# Patient Record
Sex: Female | Born: 1950 | Race: White | Hispanic: No | Marital: Married
Health system: Midwestern US, Community
[De-identification: ages and names within clinical notes are randomized; demographics above are authoritative.]

## PROBLEM LIST (undated history)

## (undated) DIAGNOSIS — B019 Varicella without complication: Secondary | ICD-10-CM

## (undated) DIAGNOSIS — M1990 Unspecified osteoarthritis, unspecified site: Secondary | ICD-10-CM

## (undated) DIAGNOSIS — T7840XA Allergy, unspecified, initial encounter: Secondary | ICD-10-CM

## (undated) DIAGNOSIS — M545 Low back pain, unspecified: Secondary | ICD-10-CM

## (undated) DIAGNOSIS — L570 Actinic keratosis: Secondary | ICD-10-CM

## (undated) DIAGNOSIS — N952 Postmenopausal atrophic vaginitis: Secondary | ICD-10-CM

## (undated) DIAGNOSIS — M419 Scoliosis, unspecified: Secondary | ICD-10-CM

## (undated) DIAGNOSIS — N390 Urinary tract infection, site not specified: Secondary | ICD-10-CM

## (undated) DIAGNOSIS — M8580 Other specified disorders of bone density and structure, unspecified site: Secondary | ICD-10-CM

## (undated) DIAGNOSIS — M810 Age-related osteoporosis without current pathological fracture: Secondary | ICD-10-CM

## (undated) DIAGNOSIS — Z1231 Encounter for screening mammogram for malignant neoplasm of breast: Secondary | ICD-10-CM

## (undated) HISTORY — DX: Scoliosis, unspecified: M41.9

## (undated) HISTORY — PX: TUBAL LIGATION: SHX77

## (undated) HISTORY — DX: Unspecified osteoarthritis, unspecified site: M19.90

## (undated) HISTORY — DX: Actinic keratosis: L57.0

## (undated) HISTORY — DX: Low back pain, unspecified: M54.50

## (undated) HISTORY — DX: Urinary tract infection, site not specified: N39.0

## (undated) HISTORY — PX: OTHER SURGICAL HISTORY: SHX169

## (undated) HISTORY — DX: Postmenopausal atrophic vaginitis: N95.2

## (undated) HISTORY — DX: Low back pain: M54.5

## (undated) HISTORY — PX: BREAST BIOPSY: SHX20

## (undated) HISTORY — DX: Other specified disorders of bone density and structure, unspecified site: M85.80

## (undated) HISTORY — PX: NASAL SEPTUM SURGERY: SHX37

## (undated) HISTORY — DX: Varicella without complication: B01.9

## (undated) HISTORY — DX: Allergy, unspecified, initial encounter: T78.40XA

---

## 2004-07-16 LAB — HM COLONOSCOPY: HM Colonoscopy: NORMAL

## 2010-06-15 LAB — HM PAP SMEAR: HM Pap smear: NORMAL

## 2011-11-05 LAB — HM MAMMOGRAPHY

## 2011-11-21 ENCOUNTER — Ambulatory Visit: Payer: Self-pay | Admitting: Family Medicine

## 2011-12-06 HISTORY — PX: OTHER SURGICAL HISTORY: SHX169

## 2011-12-22 ENCOUNTER — Ambulatory Visit: Payer: Self-pay | Admitting: Internal Medicine

## 2012-07-16 ENCOUNTER — Encounter: Payer: Self-pay | Admitting: Internal Medicine

## 2012-07-16 ENCOUNTER — Ambulatory Visit (INDEPENDENT_AMBULATORY_CARE_PROVIDER_SITE_OTHER): Payer: Managed Care, Other (non HMO) | Admitting: Internal Medicine

## 2012-07-16 ENCOUNTER — Other Ambulatory Visit: Payer: Self-pay | Admitting: Internal Medicine

## 2012-07-16 VITALS — BP 130/80 | HR 95 | Temp 98.4°F | Ht 65.75 in | Wt 178.0 lb

## 2012-07-16 DIAGNOSIS — M545 Low back pain: Secondary | ICD-10-CM | POA: Insufficient documentation

## 2012-07-16 DIAGNOSIS — L678 Other hair color and hair shaft abnormalities: Secondary | ICD-10-CM | POA: Insufficient documentation

## 2012-07-16 DIAGNOSIS — J309 Allergic rhinitis, unspecified: Secondary | ICD-10-CM | POA: Insufficient documentation

## 2012-07-16 DIAGNOSIS — L989 Disorder of the skin and subcutaneous tissue, unspecified: Secondary | ICD-10-CM | POA: Insufficient documentation

## 2012-07-16 DIAGNOSIS — L680 Hirsutism: Secondary | ICD-10-CM

## 2012-07-16 DIAGNOSIS — N952 Postmenopausal atrophic vaginitis: Secondary | ICD-10-CM

## 2012-07-16 MED ORDER — ESTRADIOL 10 MCG VA TABS: 1.0000 | ORAL_TABLET | VAGINAL | Status: DC

## 2012-07-16 MED ORDER — ESTRADIOL 25 MCG VA TABS: 25.0000 ug | ORAL_TABLET | VAGINAL | Status: DC

## 2012-07-16 MED ORDER — CYCLOSPORINE 0.05 % OP EMUL: 1.0000 [drp] | Freq: Two times a day (BID) | OPHTHALMIC | Status: DC

## 2012-07-16 MED ORDER — FLUTICASONE PROPIONATE 50 MCG/ACT NA SUSP: 2.0000 | Freq: Every day | NASAL | Status: DC

## 2012-07-16 MED ORDER — EFLORNITHINE HCL 13.9 % EX CREA: 1.0000 "application " | TOPICAL_CREAM | Freq: Two times a day (BID) | CUTANEOUS | Status: DC

## 2012-07-16 NOTE — Assessment & Plan Note (Signed)
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 Will set up referral to dermatology for biopsy.

## 2012-07-16 NOTE — Telephone Encounter (Signed)
Patient called in and states that she was about to mail in her prescriptions and noticed that the vagifem was for 25 mcg, however she states they no longer offer that dosage and she needs one that states 10 mg.  She said everything else could stay the same on the prescription.  Please call when this is ready for her to pick up.

## 2012-07-16 NOTE — Progress Notes (Signed)
Subjective:    Patient ID: Jill Michael, female    DOB: Dec 09, 1950, 61 y.o.   MRN: [id number]  HPI 61 year old female with history of atrophic vaginitis, allergic rhinitis presents to establish care. Her primary concern today is a several day history of lower back pain. She reports that she strained her back after driving for a prolonged period and then bending to reach some tissue in her bathroom. The pain became severe over last weekend and she went to urgent care. She was treated with ibuprofen 600 mg 3 times daily and Flexeril to use as needed. She reports some improvement with this. She continues to have some pain in her lower back and decreased range of motion. She denies any weakness in her legs or numbness in her legs. She denies any loss of control of bowel or bladder. She has had this kind of back pain in the past on 2 other occasions.  In regards to her history of atrophic vaginitis, she reports symptoms are well controlled with Vagifem.  In regards to history of allergic rhinitis, she reports symptoms are well controlled with Nasonex, but she would like to change to [facility] because of cost of medication. Symptoms typically include sneezing and watery eyes. She also uses Claritin to help with symptoms.  Outpatient Encounter Prescriptions as of 07/16/2012  Medication Sig Dispense Refill  . Calcium Carbonate Antacid 400 MG CHEW Chew 1 tablet by mouth daily.      . Cholecalciferol (VITAMIN D) 2000 UNITS CAPS Take 1 capsule by mouth daily.      . cycloSPORINE (RESTASIS) 0.05 % ophthalmic emulsion Place 1 drop into both eyes 2 (two) times daily.  0.4 mL  3  . Eflornithine HCl (VANIQA) 13.9 % cream Apply 1 application topically 2 (two) times daily with a meal.  30 g  3  . estradiol (VAGIFEM) 25 MCG vaginal tablet Place 1 tablet (25 mcg total) vaginally 2 (two) times a week.  24 tablet  3  . fluticasone (FLONASE) 50 MCG/ACT nasal spray Place 2 sprays into the nose daily.  16 g  3  . Ibuprofen  200 MG CAPS Take 1 capsule by mouth 3 (three) times daily.      Marland Kitchen loratadine (CLARITIN) 10 MG tablet Take 10 mg by mouth daily.      . Misc Natural Products (OSTEO BI-FLEX ADV DOUBLE ST PO) Take 1 capsule by mouth daily.      . Omega-3 Fatty Acids (FISH OIL) 1000 MG CAPS Take 1 capsule by mouth daily.       BP 130/80  Pulse 95  Temp 98.4 F (36.9 C) (Oral)  Ht 5' 5.75" (1.67 m)  Wt 178 lb (80.74 kg)  BMI 28.95 kg/m2  SpO2 96%  Review of Systems  Constitutional: Negative for fever, chills, appetite change, fatigue and unexpected weight change.  HENT: Negative for ear pain, congestion, sore throat, trouble swallowing, neck pain, voice change and sinus pressure.   Eyes: Negative for visual disturbance.  Respiratory: Negative for cough, shortness of breath, wheezing and stridor.   Cardiovascular: Negative for chest pain, palpitations and leg swelling.  Gastrointestinal: Negative for nausea, vomiting, abdominal pain, diarrhea, constipation, blood in stool, abdominal distention and anal bleeding.  Genitourinary: Negative for dysuria and flank pain.  Musculoskeletal: Positive for back pain. Negative for myalgias, arthralgias and gait problem.  Skin: Negative for color change and rash.  Neurological: Negative for dizziness and headaches.  Hematological: Negative for adenopathy. Does not bruise/bleed easily.  Psychiatric/Behavioral: Negative  for suicidal ideas, disturbed wake/sleep cycle and dysphoric mood. The patient is not nervous/anxious.        Objective:   Physical Exam  Constitutional: She is oriented to person, place, and time. She appears well-developed and well-nourished. No distress.  HENT:  Head: Normocephalic and atraumatic.  Right Ear: External ear normal.  Left Ear: External ear normal.  Nose: Nose normal.  Mouth/Throat: Oropharynx is clear and moist. No oropharyngeal exudate.  Eyes: Conjunctivae are normal. Pupils are equal, round, and reactive to light. Right eye  exhibits no discharge. Left eye exhibits no discharge. No scleral icterus.  Neck: Normal range of motion. Neck supple. No tracheal deviation present. No thyromegaly present.  Cardiovascular: Normal rate, regular rhythm, normal heart sounds and intact distal pulses.  Exam reveals no gallop and no friction rub.   No murmur heard. Pulmonary/Chest: Effort normal and breath sounds normal. No respiratory distress. She has no wheezes. She has no rales. She exhibits no tenderness.  Abdominal: Soft. Bowel sounds are normal. She exhibits no distension. There is no tenderness.  Musculoskeletal: She exhibits no edema and no tenderness.       Lumbar back: She exhibits decreased range of motion and pain.  Lymphadenopathy:    She has no cervical adenopathy.  Neurological: She is alert and oriented to person, place, and time. No cranial nerve deficit. She exhibits normal muscle tone. Coordination normal.  Skin: Skin is warm and dry. No rash noted. She is not diaphoretic. No erythema. No pallor.  Psychiatric: She has a normal mood and affect. Her behavior is normal. Judgment and thought content normal.          Assessment & Plan:

## 2012-07-16 NOTE — Telephone Encounter (Signed)
Rx ready for pick up, patient advised via telephone.

## 2012-07-16 NOTE — Assessment & Plan Note (Signed)
Symptoms well controlled with Nasonex, but given cost of this medication will change to generic Flonase. Followup as needed.

## 2012-07-16 NOTE — Assessment & Plan Note (Signed)
Patient with recent exacerbation of lower back pain. Using ibuprofen and Flexeril with some improvement. We'll continue to monitor. If symptoms are persisting, would favor adding prednisone pack.

## 2012-07-16 NOTE — Assessment & Plan Note (Signed)
Symptoms well controlled with use of Vaniqua. Will continue.

## 2012-07-16 NOTE — Assessment & Plan Note (Signed)
Symptoms well controlled with Vagifem. Will refill today.

## 2012-07-17 ENCOUNTER — Encounter: Payer: Self-pay | Admitting: Internal Medicine

## 2012-07-17 MED ORDER — PREDNISONE (PAK) 10 MG PO TABS: ORAL_TABLET | ORAL | Status: AC

## 2012-07-17 MED ORDER — PREDNISONE (PAK) 10 MG PO TABS: ORAL_TABLET | ORAL | Status: DC

## 2012-07-19 LAB — HM PAP SMEAR: HM Pap smear: NEGATIVE

## 2012-08-07 ENCOUNTER — Other Ambulatory Visit: Payer: Self-pay | Admitting: Internal Medicine

## 2012-08-08 LAB — LIPID PANEL W/O CHOL/HDL RATIO
LDL Calculated: 117 mg/dL — ABNORMAL HIGH (ref 0–99)
Triglycerides: 134 mg/dL (ref 0–149)

## 2012-08-08 LAB — COMPREHENSIVE METABOLIC PANEL
Albumin: 4 g/dL (ref 3.6–4.8)
Alkaline Phosphatase: 55 IU/L (ref 42–107)
BUN/Creatinine Ratio: 27 — ABNORMAL HIGH (ref 11–26)
BUN: 21 mg/dL (ref 8–27)
CO2: 25 mmol/L (ref 19–28)
Creatinine, Ser: 0.78 mg/dL (ref 0.57–1.00)
GFR calc Af Amer: 96 mL/min/{1.73_m2} (ref >59)
GFR calc non Af Amer: 83 mL/min/{1.73_m2} (ref >59)
Globulin, Total: 2.5 g/dL (ref 1.5–4.5)
Total Bilirubin: 0.6 mg/dL (ref 0.0–1.2)

## 2012-08-08 LAB — CBC WITH DIFFERENTIAL
Eos: 11 % — ABNORMAL HIGH (ref 0–7)
HCT: 42.3 % (ref 34.0–46.6)
Lymphocytes Absolute: 1.7 10*3/uL (ref 0.7–4.5)
MCV: 93 fL (ref 79–97)
Monocytes: 11 % (ref 4–13)
Neutrophils Absolute: 2.1 10*3/uL (ref 1.8–7.8)
RBC: 4.57 x10E6/uL (ref 3.77–5.28)
WBC: 4.9 10*3/uL (ref 4.0–10.5)

## 2012-08-08 LAB — TSH: TSH: 3.04 u[IU]/mL (ref 0.450–4.500)

## 2012-08-16 LAB — HM PAP SMEAR: HM Pap smear: NEGATIVE

## 2012-09-03 ENCOUNTER — Ambulatory Visit (INDEPENDENT_AMBULATORY_CARE_PROVIDER_SITE_OTHER): Payer: Managed Care, Other (non HMO) | Admitting: Internal Medicine

## 2012-09-03 ENCOUNTER — Encounter: Payer: Self-pay | Admitting: Internal Medicine

## 2012-09-03 ENCOUNTER — Other Ambulatory Visit (HOSPITAL_COMMUNITY)
Admission: RE | Admit: 2012-09-03 | Discharge: 2012-09-03 | Disposition: A | Discharge status: Home / Self Care | Payer: Managed Care, Other (non HMO) | Source: Ambulatory Visit | Attending: Internal Medicine | Admitting: Internal Medicine

## 2012-09-03 VITALS — BP 122/74 | HR 96 | Temp 98.3°F | Ht 66.25 in | Wt 178.5 lb

## 2012-09-03 DIAGNOSIS — Z0000 Encounter for general adult medical examination without abnormal findings: Secondary | ICD-10-CM

## 2012-09-03 DIAGNOSIS — Z01419 Encounter for gynecological examination (general) (routine) without abnormal findings: Secondary | ICD-10-CM

## 2012-09-03 DIAGNOSIS — Z1151 Encounter for screening for human papillomavirus (HPV): Secondary | ICD-10-CM | POA: Insufficient documentation

## 2012-09-03 NOTE — Progress Notes (Signed)
Subjective:    Patient ID: Jill Michael, female    DOB: [birth year], 61 y.o.   MRN: [id number]  HPI 61 year old female presents for annual exam. She reports she is generally doing well. She notes that she is planning to have resurfacing of her right knee because of ongoing osteoarthritis. This is scheduled for December 2013. She currently uses ibuprofen to help control pain in her right knee. Aside from this, she reports she is doing well. She follows a relatively healthy diet and gets physical activity. She denies any new concerns today.  Outpatient Encounter Prescriptions as of 09/03/2012  Medication Sig Dispense Refill  . Calcium Carbonate Antacid 400 MG CHEW Chew 1 tablet by mouth daily.      . Cholecalciferol (VITAMIN D) 2000 UNITS CAPS Take 1 capsule by mouth daily.      . cycloSPORINE (RESTASIS) 0.05 % ophthalmic emulsion Place 1 drop into both eyes 2 (two) times daily.  0.4 mL  3  . Eflornithine HCl (VANIQA) 13.9 % cream Apply 1 application topically 2 (two) times daily with a meal.  30 g  3  . Estradiol (VAGIFEM) 10 MCG TABS Place 1 tablet (10 mcg total) vaginally 2 (two) times a week.  24 tablet  3  . fluticasone (FLONASE) 50 MCG/ACT nasal spray Place 2 sprays into the nose daily.  16 g  3  . Ibuprofen 200 MG CAPS Take 1 capsule by mouth 3 (three) times daily.      Marland Kitchen loratadine (CLARITIN) 10 MG tablet Take 10 mg by mouth daily.      . Misc Natural Products (OSTEO BI-FLEX ADV DOUBLE ST PO) Take 1 capsule by mouth daily.      . Omega-3 Fatty Acids (FISH OIL) 1000 MG CAPS Take 1 capsule by mouth daily.        Review of Systems  Constitutional: Negative for fever, chills, appetite change, fatigue and unexpected weight change.  HENT: Negative for ear pain, congestion, sore throat, trouble swallowing, neck pain, voice change and sinus pressure.   Eyes: Negative for visual disturbance.  Respiratory: Negative for cough, shortness of breath, wheezing and stridor.   Cardiovascular: Negative  for chest pain, palpitations and leg swelling.  Gastrointestinal: Negative for nausea, vomiting, abdominal pain, diarrhea, constipation, blood in stool, abdominal distention and anal bleeding.  Genitourinary: Negative for dysuria and flank pain.  Musculoskeletal: Negative for myalgias, arthralgias and gait problem.  Skin: Negative for color change and rash.  Neurological: Negative for dizziness and headaches.  Hematological: Negative for adenopathy. Does not bruise/bleed easily.  Psychiatric/Behavioral: Negative for suicidal ideas, disturbed wake/sleep cycle and dysphoric mood. The patient is not nervous/anxious.        Objective:   Physical Exam  Constitutional: She is oriented to person, place, and time. She appears well-developed and well-nourished. No distress.  HENT:  Head: Normocephalic and atraumatic.  Right Ear: External ear normal.  Left Ear: External ear normal.  Nose: Nose normal.  Mouth/Throat: Oropharynx is clear and moist. No oropharyngeal exudate.  Eyes: Conjunctivae normal are normal. Pupils are equal, round, and reactive to light. Right eye exhibits no discharge. Left eye exhibits no discharge. No scleral icterus.  Neck: Normal range of motion. Neck supple. No tracheal deviation present. No thyromegaly present.  Cardiovascular: Normal rate, regular rhythm, normal heart sounds and intact distal pulses.  Exam reveals no gallop and no friction rub.   No murmur heard. Pulmonary/Chest: Effort normal and breath sounds normal. No respiratory distress. She has no wheezes. She  has no rales. She exhibits no tenderness.  Abdominal: Soft. Bowel sounds are normal. She exhibits no distension and no mass. There is no tenderness. There is no rebound and no guarding.  Genitourinary: Rectum normal, vagina normal and uterus normal. No breast swelling, tenderness, discharge or bleeding. Pelvic exam was performed with patient supine. There is no rash, tenderness or lesion on the right labia.  There is no rash, tenderness or lesion on the left labia. Uterus is not enlarged and not tender. Cervix exhibits no motion tenderness, no discharge and no friability. Right adnexum displays no mass, no tenderness and no fullness. Left adnexum displays no mass, no tenderness and no fullness. No erythema or tenderness around the vagina. No vaginal discharge found.  Musculoskeletal: She exhibits no edema and no tenderness.       Right knee: She exhibits decreased range of motion.  Lymphadenopathy:    She has no cervical adenopathy.  Neurological: She is alert and oriented to person, place, and time. No cranial nerve deficit. She exhibits normal muscle tone. Coordination normal.  Skin: Skin is warm and dry. No rash noted. She is not diaphoretic. No erythema. No pallor.  Psychiatric: She has a normal mood and affect. Her behavior is normal. Judgment and thought content normal.          Assessment & Plan:

## 2012-09-03 NOTE — Patient Instructions (Signed)
Lac-Hydrin 

## 2012-09-03 NOTE — Assessment & Plan Note (Signed)
General medical exam is normal today including breast and pelvic exam. Pap is pending. Reviewed recent lab work including CBC, CMP, lipid profile. Encourage continued efforts at healthy diet and regular physical activity. Patient is up-to-date on vaccinations. Other health maintenance is up to date. Followup in one year or sooner as needed. Note that patient would be considered low perioperative risk for upcoming right knee surgery.

## 2012-10-29 ENCOUNTER — Ambulatory Visit: Payer: Self-pay | Admitting: Unknown Physician Specialty

## 2012-11-15 ENCOUNTER — Encounter: Payer: Self-pay | Admitting: Adult Health

## 2012-11-15 ENCOUNTER — Ambulatory Visit (INDEPENDENT_AMBULATORY_CARE_PROVIDER_SITE_OTHER): Payer: Managed Care, Other (non HMO) | Admitting: Adult Health

## 2012-11-15 VITALS — BP 120/74 | HR 74 | Temp 98.3°F | Ht 66.0 in | Wt 179.0 lb

## 2012-11-15 DIAGNOSIS — R079 Chest pain, unspecified: Secondary | ICD-10-CM

## 2012-11-15 DIAGNOSIS — K219 Gastro-esophageal reflux disease without esophagitis: Secondary | ICD-10-CM

## 2012-11-15 DIAGNOSIS — J329 Chronic sinusitis, unspecified: Secondary | ICD-10-CM | POA: Insufficient documentation

## 2012-11-15 MED ORDER — CIPROFLOXACIN HCL 500 MG PO TABS: 500.0000 mg | ORAL_TABLET | Freq: Two times a day (BID) | ORAL | Status: DC

## 2012-11-15 NOTE — Assessment & Plan Note (Signed)
Pain localized in lower chest area midline and slightly to the left. EKG normal. Patient has not been preparing her meals since surgery. Husband has been bringing her take-out. Symptoms likely r/t acid reflux which she has had in the past. Zantac ordered.

## 2012-11-15 NOTE — Assessment & Plan Note (Addendum)
History of acid reflux that has been well controlled with diet. New symptoms likely secondary to change in diet since surgery 11/07/12. Zantac ordered.

## 2012-11-15 NOTE — Patient Instructions (Addendum)
Stop the Doxycycline and start Cipro for 7 days. Continue your claritin and flonase.   EKG was normal. You are probably experiencing some acid reflux. Take Zantac 150 mg once or twice per day.  Call if no resolution of symptoms within 2-3 days.

## 2012-11-15 NOTE — Progress Notes (Signed)
Subjective:    Patient ID: Jill Michael, female    DOB: 1951-11-01, 62 y.o.   MRN: [id number]  HPI  Patient is a 61 y/o female who is s/p right partial knee replacement on 11/07/12. She began to experience fever (100-101 F), chills and HA the Friday post sgy. Pt went to see her Orthopod on Monday (11/12/12) and was prescribed Doxycycline. She reports now feeling fullness in right ear, post nasal drip and coughing. Prior to sgy, she stopped taking her claritin but resumed this yesterday.  Pt also reports that last night she experienced pain in the lower part of her chest which woke her. She thought it might have been r/t eating some broccoli for dinner and took tums. She reports the pain was originally sharp and now is more of a dull ache. Denies pain radiating. No burning.  Current Outpatient Prescriptions on File Prior to Visit  Medication Sig Dispense Refill  . Calcium Carbonate Antacid 400 MG CHEW Chew 1 tablet by mouth daily.      . Cholecalciferol (VITAMIN D) 2000 UNITS CAPS Take 1 capsule by mouth daily.      . cycloSPORINE (RESTASIS) 0.05 % ophthalmic emulsion Place 1 drop into both eyes 2 (two) times daily.  0.4 mL  3  . Eflornithine HCl (VANIQA) 13.9 % cream Apply 1 application topically 2 (two) times daily with a meal.  30 g  3  . Estradiol (VAGIFEM) 10 MCG TABS Place 1 tablet (10 mcg total) vaginally 2 (two) times a week.  24 tablet  3  . fluticasone (FLONASE) 50 MCG/ACT nasal spray Place 2 sprays into the nose daily.  16 g  3  . loratadine (CLARITIN) 10 MG tablet Take 10 mg by mouth daily.      . Misc Natural Products (OSTEO BI-FLEX ADV DOUBLE ST PO) Take 1 capsule by mouth daily.      . Omega-3 Fatty Acids (FISH OIL) 1000 MG CAPS Take 1 capsule by mouth daily.         Review of Systems  Constitutional: Positive for fever and chills. Negative for appetite change.  HENT: Positive for congestion and postnasal drip.   Eyes: Negative.   Respiratory: Positive for cough. Negative for  chest tightness and shortness of breath.   Gastrointestinal: Positive for nausea and abdominal pain. Negative for vomiting and diarrhea.  Genitourinary: Negative for dysuria, flank pain and difficulty urinating.  Musculoskeletal: Positive for joint swelling.       Right partial knee replacement 11/07/12  Neurological: Positive for dizziness and headaches. Negative for syncope, weakness and light-headedness.  Psychiatric/Behavioral: Negative.     BP 120/74  Pulse 74  Temp 98.3 F (36.8 C) (Oral)  Ht 5\' 6"  (1.676 m)  Wt 179 lb (81.194 kg)  BMI 28.89 kg/m2  SpO2 96%     Objective:   Physical Exam  Constitutional: She is oriented to person, place, and time. She appears well-developed and well-nourished. No distress.  HENT:  Head: Normocephalic.  Mouth/Throat: No oropharyngeal exudate.       Pharyngeal erythema, right tympanic membrane bulging, bilateral ear cerumen buildup  Eyes: Conjunctivae normal and EOM are normal. Right eye exhibits no discharge. Left eye exhibits no discharge.  Neck: Normal range of motion. No tracheal deviation present.  Cardiovascular: Normal rate, regular rhythm and normal heart sounds.  Exam reveals no gallop.   No murmur heard. Pulmonary/Chest: Effort normal and breath sounds normal. No respiratory distress. She has no wheezes. She has no rales.  Abdominal: Soft. Bowel sounds are normal. There is no tenderness.  Musculoskeletal: She exhibits edema.       Status post right partial knee replacement  Lymphadenopathy:    She has cervical adenopathy.  Neurological: She is alert and oriented to person, place, and time.  Skin: Skin is warm and dry. No rash noted. No erythema.  Psychiatric: She has a normal mood and affect. Her behavior is normal. Judgment and thought content normal.       Assessment & Plan:

## 2012-11-26 ENCOUNTER — Ambulatory Visit: Payer: Self-pay | Admitting: Internal Medicine

## 2012-12-04 ENCOUNTER — Encounter: Payer: Self-pay | Admitting: Unknown Physician Specialty

## 2012-12-05 ENCOUNTER — Encounter: Payer: Self-pay | Admitting: Unknown Physician Specialty

## 2012-12-12 ENCOUNTER — Encounter: Payer: Self-pay | Admitting: Internal Medicine

## 2012-12-12 LAB — HM MAMMOGRAPHY: HM Mammogram: NORMAL

## 2013-01-19 ENCOUNTER — Other Ambulatory Visit: Payer: Self-pay

## 2013-02-12 ENCOUNTER — Telehealth: Payer: Self-pay | Admitting: Internal Medicine

## 2013-02-12 MED ORDER — CYCLOSPORINE 0.05 % OP EMUL: 1.0000 [drp] | Freq: Two times a day (BID) | OPHTHALMIC | Status: DC

## 2013-02-12 NOTE — Telephone Encounter (Signed)
Left message for patient to return call, looks like there were refills on this medication

## 2013-02-12 NOTE — Telephone Encounter (Signed)
Patient left message on voicemail stating when she was seen in September, all her prescriptions were written for 90 days with refills. However there were no refills on her Restasis, she would like a new Rx for this so she may pick this up.

## 2013-02-12 NOTE — Telephone Encounter (Signed)
Patient returned call, per patient when she called [facility] they told her she did not have any refills on the Restasis and they will not refill it. Could you please approve and print her a script for this and she will pick it up. Patient is aware it will be tomorrow before script will be ready.

## 2013-02-12 NOTE — Telephone Encounter (Signed)
Rx was printed. Will sign tomorrow.

## 2013-02-13 NOTE — Telephone Encounter (Signed)
Rx ready for pick up. 

## 2013-03-04 ENCOUNTER — Telehealth: Payer: Self-pay | Admitting: *Deleted

## 2013-03-04 NOTE — Telephone Encounter (Signed)
Patient called to state she only received half of her Restasis script through her mail order pharmacy. She would like for you to call her

## 2013-03-05 NOTE — Telephone Encounter (Signed)
Patient called in reference to her Restasis, it was written wrong, Each box contains 30 ampules and she should be getting 8 boxes. It should be written as listed as below:  240 ampules in the comment area with 3 refills.   She will call back when it is time for her to get a refill, she is just going to use what she already has but wanted to make [place] aware of how it needs to be written in order for her to get her full 3 month supply with refills.

## 2013-03-06 ENCOUNTER — Telehealth: Payer: Self-pay | Admitting: *Deleted

## 2013-03-06 NOTE — Telephone Encounter (Signed)
Refill Request  Restasis EM[facility] OP

## 2013-03-07 NOTE — Telephone Encounter (Signed)
Spoke with patient in reference to this on 3/31, she will hold off on a refill until she uses up what she has at home. When she need refill, she will call office to sent prescription

## 2013-07-19 ENCOUNTER — Encounter: Payer: Self-pay | Admitting: Internal Medicine

## 2013-07-19 ENCOUNTER — Ambulatory Visit (INDEPENDENT_AMBULATORY_CARE_PROVIDER_SITE_OTHER): Payer: Managed Care, Other (non HMO) | Admitting: Internal Medicine

## 2013-07-19 VITALS — BP 128/78 | HR 88 | Temp 98.3°F | Ht 65.5 in | Wt 176.0 lb

## 2013-07-19 DIAGNOSIS — N952 Postmenopausal atrophic vaginitis: Secondary | ICD-10-CM

## 2013-07-19 DIAGNOSIS — J309 Allergic rhinitis, unspecified: Secondary | ICD-10-CM

## 2013-07-19 DIAGNOSIS — M7521 Bicipital tendinitis, right shoulder: Secondary | ICD-10-CM

## 2013-07-19 DIAGNOSIS — M7520 Bicipital tendinitis, unspecified shoulder: Secondary | ICD-10-CM

## 2013-07-19 DIAGNOSIS — Z0000 Encounter for general adult medical examination without abnormal findings: Secondary | ICD-10-CM

## 2013-07-19 MED ORDER — PREDNISONE (PAK) 10 MG PO TABS: ORAL_TABLET | ORAL | Status: DC

## 2013-07-19 MED ORDER — CYCLOBENZAPRINE HCL 5 MG PO TABS: 5.0000 mg | ORAL_TABLET | Freq: Three times a day (TID) | ORAL | Status: DC | PRN

## 2013-07-19 MED ORDER — FLUTICASONE PROPIONATE 50 MCG/ACT NA SUSP: 2.0000 | Freq: Every day | NASAL | Status: DC

## 2013-07-19 MED ORDER — CYCLOSPORINE 0.05 % OP EMUL: 1.0000 [drp] | Freq: Two times a day (BID) | OPHTHALMIC | Status: DC

## 2013-07-19 NOTE — Assessment & Plan Note (Signed)
Symptoms and exam is most consistent with biceps tendinitis, likely from overuse at work. Recommended continued avoidance of overuse, icing the area several times daily. Will continue Flexeril as needed. Will start prednisone taper pack. We discussed referral to occupational therapy and or sports medicine if no improvement.

## 2013-07-19 NOTE — Assessment & Plan Note (Signed)
General medical exam including breast exam normal today. Pelvic exam deferred as pelvic exam and Pap smear were normal 2013. Mammogram will be scheduled in December 2014. Colonoscopy is up-to-date. Immunizations are up-to-date. Will check labs including CBC, CMP, lipid profile. Encouraged continued efforts at healthy diet and physical activity with walking.

## 2013-07-19 NOTE — Progress Notes (Signed)
Subjective:    Patient ID: Jill Michael, female    DOB: 1951-03-07, 62 y.o.   MRN: [id number]  HPI 62 year old female with history of allergic rhinitis, intermittent low back pain, chronic dry eyes, presents for annual exam. Her primary concern today is several week history of right anterior shoulder pain. 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 Over the last few weeks, she has not had her typical 2 days off, and has had worsening pain. Yesterday, she tried taking Flexeril with some improvement in her symptoms. She is also using intermittent ibuprofen with some improvement. She has been icing her shoulder. She denies any weakness in her arm. Pain is made worse with repetitive motion of her right shoulder. She denies any trauma to her shoulder.  Aside from this, she is generally feeling well. She notes some increased stress recently in caring for her mother who is suffering from dementia and lives in [place]. She has been commuting back and forth to [place] on the weekends to help provide care for her. However, she feels that she is coping relatively well. She denies any symptoms of depressed mood, lack of interest in usual activities.  Outpatient Encounter Prescriptions as of 07/19/2013  Medication Sig Dispense Refill  . acetaminophen (TYLENOL) 500 MG tablet Take 500 mg by mouth every 6 (six) hours as needed.      . Calcium Carbonate Antacid 400 MG CHEW Chew 1 tablet by mouth daily.      . Cholecalciferol (VITAMIN D) 2000 UNITS CAPS Take 1 capsule by mouth daily.      . cyclobenzaprine (FLEXERIL) 5 MG tablet Take 1 tablet (5 mg total) by mouth 3 (three) times daily as needed for muscle spasms.  90 tablet  3  . cycloSPORINE (RESTASIS) 0.05 % ophthalmic emulsion Place 1 drop into both eyes 2 (two) times daily.  240 each  3  . Estradiol (VAGIFEM) 10 MCG TABS Place 1 tablet (10 mcg total)  vaginally 2 (two) times a week.  24 tablet  3  . fluticasone (FLONASE) 50 MCG/ACT nasal spray Place 2 sprays into the nose daily.  48 g  3  . ibuprofen (ADVIL,MOTRIN) 200 MG tablet Take 200 mg by mouth every 6 (six) hours as needed for pain.      Marland Kitchen loratadine (CLARITIN) 10 MG tablet Take 10 mg by mouth daily.      . Misc Natural Products (OSTEO BI-FLEX ADV DOUBLE ST PO) Take 1 capsule by mouth daily.      . Eflornithine HCl (VANIQA) 13.9 % cream Apply 1 application topically 2 (two) times daily with a meal.  30 g  3  . Omega-3 Fatty Acids (FISH OIL) 1000 MG CAPS Take 1 capsule by mouth daily.       No facility-administered encounter medications on file as of 07/19/2013.   BP 128/78  Pulse 88  Temp(Src) 98.3 F (36.8 C) (Oral)  Ht 5' 5.5" (1.664 m)  Wt 176 lb (79.833 kg)  BMI 28.83 kg/m2  SpO2 98%  Review of Systems  Constitutional: Negative for fever, chills, appetite change, fatigue and unexpected weight change.  HENT: Negative for ear pain, congestion, sore throat, trouble swallowing, neck pain, voice change and sinus pressure.   Eyes: Negative for visual disturbance.  Respiratory: Negative for cough, shortness of breath, wheezing and stridor.   Cardiovascular: Negative for chest pain, palpitations and leg swelling.  Gastrointestinal: Negative  for nausea, vomiting, abdominal pain, diarrhea, constipation, blood in stool, abdominal distention and anal bleeding.  Genitourinary: Negative for dysuria and flank pain.  Musculoskeletal: Positive for myalgias and arthralgias. Negative for gait problem.  Skin: Negative for color change and rash.  Neurological: Negative for dizziness and headaches.  Hematological: Negative for adenopathy. Does not bruise/bleed easily.  Psychiatric/Behavioral: Negative for suicidal ideas, sleep disturbance and dysphoric mood. The patient is nervous/anxious.        Objective:   Physical Exam  Constitutional: She is oriented to person, place, and time. She  appears well-developed and well-nourished. No distress.  HENT:  Head: Normocephalic and atraumatic.  Right Ear: External ear normal.  Left Ear: External ear normal.  Nose: Nose normal.  Mouth/Throat: Oropharynx is clear and moist. No oropharyngeal exudate.  Eyes: Conjunctivae are normal. Pupils are equal, round, and reactive to light. Right eye exhibits no discharge. Left eye exhibits no discharge. No scleral icterus.  Neck: Normal range of motion. Neck supple. No tracheal deviation present. No thyromegaly present.  Cardiovascular: Normal rate, regular rhythm, normal heart sounds and intact distal pulses.  Exam reveals no gallop and no friction rub.   No murmur heard. Pulmonary/Chest: Effort normal and breath sounds normal. No accessory muscle usage. Not tachypneic. No respiratory distress. She has no decreased breath sounds. She has no wheezes. She has no rhonchi. She has no rales. She exhibits no tenderness. Right breast exhibits no inverted nipple, no mass, no nipple discharge, no skin change and no tenderness. Left breast exhibits no inverted nipple, no mass, no nipple discharge, no skin change and no tenderness. Breasts are symmetrical.  Abdominal: Soft. Bowel sounds are normal. She exhibits no distension and no mass. There is no tenderness. There is no rebound and no guarding.  Musculoskeletal: Normal range of motion. She exhibits no edema.       Right shoulder: She exhibits tenderness (very mild at orgin of biceps) and pain. She exhibits normal range of motion, no bony tenderness and no swelling.  Lymphadenopathy:    She has no cervical adenopathy.  Neurological: She is alert and oriented to person, place, and time. No cranial nerve deficit. She exhibits normal muscle tone. Coordination normal.  Skin: Skin is warm and dry. No rash noted. She is not diaphoretic. No erythema. No pallor.  Psychiatric: She has a normal mood and affect. Her behavior is normal. Judgment and thought content  normal.          Assessment & Plan:

## 2013-07-19 NOTE — Assessment & Plan Note (Signed)
Symptoms well controlled with Vagifem. Will continue.

## 2013-07-19 NOTE — Assessment & Plan Note (Signed)
Symptoms well controlled with Loratadine and Fluticasone. Will continue.

## 2013-07-24 ENCOUNTER — Encounter: Payer: Self-pay | Admitting: Internal Medicine

## 2013-10-10 ENCOUNTER — Other Ambulatory Visit: Payer: Self-pay

## 2013-10-25 ENCOUNTER — Other Ambulatory Visit: Payer: Self-pay | Admitting: Internal Medicine

## 2013-10-26 LAB — COMPREHENSIVE METABOLIC PANEL
Albumin: 4 g/dL (ref 3.6–4.8)
Alkaline Phosphatase: 58 IU/L (ref 39–117)
BUN/Creatinine Ratio: 17 (ref 11–26)
BUN: 13 mg/dL (ref 8–27)
CO2: 25 mmol/L (ref 18–29)
Calcium: 9.4 mg/dL (ref 8.6–10.2)
Chloride: 104 mmol/L (ref 97–108)
Glucose: 94 mg/dL (ref 65–99)

## 2013-10-26 LAB — CBC
HCT: 43.1 % (ref 34.0–46.6)
Hemoglobin: 14.7 g/dL (ref 11.1–15.9)
MCH: 31.3 pg (ref 26.6–33.0)
MCHC: 34.1 g/dL (ref 31.5–35.7)
MCV: 92 fL (ref 79–97)
Platelets: 296 10*3/uL (ref 150–379)
RBC: 4.7 x10E6/uL (ref 3.77–5.28)

## 2013-10-26 LAB — LIPID PANEL W/O CHOL/HDL RATIO
Cholesterol, Total: 170 mg/dL (ref 100–199)
LDL Calculated: 107 mg/dL — ABNORMAL HIGH (ref 0–99)
VLDL Cholesterol Cal: 25 mg/dL (ref 5–40)

## 2013-10-26 LAB — VITAMIN D 25 HYDROXY (VIT D DEFICIENCY, FRACTURES): Vit D, 25-Hydroxy: 36.8 ng/mL (ref 30.0–100.0)

## 2013-10-26 LAB — TSH: TSH: 2.2 u[IU]/mL (ref 0.450–4.500)

## 2013-12-03 ENCOUNTER — Ambulatory Visit: Payer: Self-pay | Admitting: Internal Medicine

## 2013-12-31 ENCOUNTER — Encounter: Payer: Self-pay | Admitting: Internal Medicine

## 2014-07-01 ENCOUNTER — Ambulatory Visit: Payer: Managed Care, Other (non HMO) | Admitting: Adult Health

## 2014-07-07 ENCOUNTER — Ambulatory Visit: Payer: Managed Care, Other (non HMO) | Admitting: Adult Health

## 2014-07-21 ENCOUNTER — Ambulatory Visit: Payer: Self-pay | Admitting: Unknown Physician Specialty

## 2014-09-16 ENCOUNTER — Encounter: Payer: Self-pay | Admitting: Internal Medicine

## 2014-09-16 ENCOUNTER — Ambulatory Visit (INDEPENDENT_AMBULATORY_CARE_PROVIDER_SITE_OTHER): Payer: Managed Care, Other (non HMO) | Admitting: Internal Medicine

## 2014-09-16 VITALS — BP 108/66 | HR 84 | Temp 98.4°F | Wt 169.0 lb

## 2014-09-16 DIAGNOSIS — M47812 Spondylosis without myelopathy or radiculopathy, cervical region: Secondary | ICD-10-CM

## 2014-09-16 MED ORDER — TRAMADOL HCL 50 MG PO TABS: 50.0000 mg | ORAL_TABLET | Freq: Two times a day (BID) | ORAL | Status: DC | PRN

## 2014-09-16 NOTE — Progress Notes (Signed)
Subjective:    Patient ID: Jill Michael, female    DOB: 06/12/1951, 63 y.o.   MRN: [id number]  HPI  Pt presents to the clinic today with c/o neck pain. She reports this started 5 days ago. She is having a flare of her osteoarthritis. She has taken Ibuprofen and used her TENS unit with some relief. She denies headache, dizziness, confusion.  Review of Systems      Past Medical History  Diagnosis Date  . Arthritis   . Chicken pox   . Allergy     hay fever  . UTI (urinary tract infection)   . Low back pain     recurrent  . Arthritis     right knee, followed by [facility]  . Scoliosis     Current Outpatient Prescriptions  Medication Sig Dispense Refill  . acetaminophen (TYLENOL) 500 MG tablet Take 500 mg by mouth every 6 (six) hours as needed.      . Calcium Carbonate Antacid 400 MG CHEW Chew 1 tablet by mouth daily.      . Cholecalciferol (VITAMIN D) 2000 UNITS CAPS Take 1 capsule by mouth daily.      . cyclobenzaprine (FLEXERIL) 5 MG tablet Take 1 tablet (5 mg total) by mouth 3 (three) times daily as needed for muscle spasms.  90 tablet  3  . cycloSPORINE (RESTASIS) 0.05 % ophthalmic emulsion Place 1 drop into both eyes 2 (two) times daily.  240 each  3  . Eflornithine HCl (VANIQA) 13.9 % cream Apply 1 application topically 2 (two) times daily with a meal.  30 g  3  . Estradiol (VAGIFEM) 10 MCG TABS Place 1 tablet (10 mcg total) vaginally 2 (two) times a week.  24 tablet  3  . fluticasone (FLONASE) 50 MCG/ACT nasal spray Place 2 sprays into the nose daily.  48 g  3  . ibuprofen (ADVIL,MOTRIN) 200 MG tablet Take 200 mg by mouth every 6 (six) hours as needed for pain.      Marland Kitchen loratadine (CLARITIN) 10 MG tablet Take 10 mg by mouth daily.      . Misc Natural Products (OSTEO BI-FLEX ADV DOUBLE ST PO) Take 1 capsule by mouth daily.      . Omega-3 Fatty Acids (FISH OIL) 1000 MG CAPS Take 1 capsule by mouth daily.       No current facility-administered medications for this visit.     Allergies  Allergen Reactions  . Penicillins Hives and Rash    Family History  Problem Relation Age of Onset  . Cancer Mother     breast  . Cancer Father     lung    History   Social History  . Marital Status: Married    Spouse Name: N/A    Number of Children: N/A  . Years of Education: N/A   Occupational History  . Not on file.   Social History Main Topics  . Smoking status: Never Smoker   . Smokeless tobacco: Not on file  . Alcohol Use: Not on file  . Drug Use: Not on file  . Sexual Activity: Not on file   Other Topics Concern  . Not on file   Social History Narrative   Lives in [place]. Moved from [place] - regular, healthy   Exercise - none, walked previously     Constitutional: Denies fever, malaise, fatigue, headache or abrupt weight changes.  HEENT: Denies eye  pain, eye redness, ear pain, ringing in the ears, wax buildup, runny nose, nasal congestion, bloody nose, or sore throat. Musculoskeletal: Pt reports neck pain. Denies decrease in range of motion, difficulty with gait, muscle pain or joint swelling.   No other specific complaints in a complete review of systems (except as listed in HPI above).  Objective:   Physical Exam    BP 108/66  Pulse 84  Temp(Src) 98.4 F (36.9 C) (Oral)  Wt 169 lb (76.658 kg)  SpO2 98% Wt Readings from Last 3 Encounters:  09/16/14 169 lb (76.658 kg)  07/19/13 176 lb (79.833 kg)  11/15/12 179 lb (81.194 kg)    General: Appears her stated age, well developed, well nourished in NAD. Cardiovascular: Normal rate and rhythm. S1,S2 noted.  No murmur, rubs or gallops noted.  Pulmonary/Chest: Normal effort and positive vesicular breath sounds. No respiratory distress. No wheezes, rales or ronchi noted.  Musculoskeletal: Decreased flexion, extension and lateral rotation of the cervical spine. No pain with palpation of the cervical spine. Muscles soft, nontender  BMET    Component Value  Date/Time   NA 143 10/25/2013 0849   K 4.8 10/25/2013 0849   CL 104 10/25/2013 0849   CO2 25 10/25/2013 0849   GLUCOSE 94 10/25/2013 0849   BUN 13 10/25/2013 0849   CREATININE 0.75 10/25/2013 0849   CALCIUM 9.4 10/25/2013 0849   GFRNONAA 86 10/25/2013 0849   GFRAA 99 10/25/2013 0849    Lipid Panel     Component Value Date/Time   TRIG 123 10/25/2013 0849   HDL 38* 10/25/2013 0849   LDLCALC 107* 10/25/2013 0849    CBC    Component Value Date/Time   WBC 4.9 10/25/2013 0849   RBC 4.70 10/25/2013 0849   HGB 14.7 10/25/2013 0849   HCT 43.1 10/25/2013 0849   PLT 296 10/25/2013 0849   MCV 92 10/25/2013 0849   MCH 31.3 10/25/2013 0849   MCHC 34.1 10/25/2013 0849   RDW 12.9 10/25/2013 0849   LYMPHSABS 1.7 08/07/2012 0733   EOSABS 0.5* 08/07/2012 0733   BASOSABS 0.1 08/07/2012 0733    Hgb A1C No results found for this basename: HGBA1C       Assessment & Plan:   Osteoarthritis of Neck:  Stop the ibuprofen Start tramadol for flares Try glucosamine OTC Neck exercises given OK to continue tens unit  RTC as needed or if symptoms persist or worsen

## 2014-09-16 NOTE — Progress Notes (Signed)
Pre visit review using our clinic review tool, if applicable. No additional management support is needed unless otherwise documented below in the visit note. 

## 2014-09-16 NOTE — Patient Instructions (Addendum)

## 2014-10-02 ENCOUNTER — Ambulatory Visit: Payer: Managed Care, Other (non HMO) | Admitting: Internal Medicine

## 2014-10-03 ENCOUNTER — Telehealth: Payer: Self-pay | Admitting: Internal Medicine

## 2014-10-03 DIAGNOSIS — Z1239 Encounter for other screening for malignant neoplasm of breast: Secondary | ICD-10-CM

## 2014-10-03 DIAGNOSIS — Z780 Asymptomatic menopausal state: Secondary | ICD-10-CM

## 2014-10-03 NOTE — Telephone Encounter (Signed)
Needing mammogram and bone density order . I will schedule patient . She has her physical scheduled in December.

## 2014-10-06 NOTE — Telephone Encounter (Signed)
Left message for the patient with her appt date and time . [place] on 1.14.16 @ 9:20 for mam and bone density.

## 2014-10-17 ENCOUNTER — Encounter: Payer: Managed Care, Other (non HMO) | Admitting: Internal Medicine

## 2014-11-07 ENCOUNTER — Encounter: Payer: Self-pay | Admitting: *Deleted

## 2014-11-07 ENCOUNTER — Telehealth: Payer: Self-pay | Admitting: Internal Medicine

## 2014-11-07 ENCOUNTER — Ambulatory Visit: Payer: Self-pay | Admitting: Internal Medicine

## 2014-11-07 LAB — HM DEXA SCAN

## 2014-11-07 NOTE — Telephone Encounter (Signed)
Sent mychart with results. Has scheduled appt 11/21/14

## 2014-11-07 NOTE — Telephone Encounter (Signed)
Bone density testing showed osteopenia. She needs a 60min follow up visit to discuss.

## 2014-11-14 DIAGNOSIS — Z96651 Presence of right artificial knee joint: Secondary | ICD-10-CM | POA: Insufficient documentation

## 2014-11-18 ENCOUNTER — Encounter: Payer: Self-pay | Admitting: Internal Medicine

## 2014-11-21 ENCOUNTER — Encounter: Payer: Self-pay | Admitting: Internal Medicine

## 2014-11-21 ENCOUNTER — Ambulatory Visit (INDEPENDENT_AMBULATORY_CARE_PROVIDER_SITE_OTHER): Payer: Managed Care, Other (non HMO) | Admitting: Internal Medicine

## 2014-11-21 VITALS — BP 102/66 | HR 90 | Temp 97.9°F | Ht 66.0 in | Wt 170.5 lb

## 2014-11-21 DIAGNOSIS — Z1211 Encounter for screening for malignant neoplasm of colon: Secondary | ICD-10-CM

## 2014-11-21 DIAGNOSIS — Z0000 Encounter for general adult medical examination without abnormal findings: Secondary | ICD-10-CM

## 2014-11-21 DIAGNOSIS — M8580 Other specified disorders of bone density and structure, unspecified site: Secondary | ICD-10-CM

## 2014-11-21 DIAGNOSIS — M545 Low back pain, unspecified: Secondary | ICD-10-CM | POA: Insufficient documentation

## 2014-11-21 DIAGNOSIS — J309 Allergic rhinitis, unspecified: Secondary | ICD-10-CM

## 2014-11-21 DIAGNOSIS — N952 Postmenopausal atrophic vaginitis: Secondary | ICD-10-CM

## 2014-11-21 DIAGNOSIS — G8929 Other chronic pain: Secondary | ICD-10-CM

## 2014-11-21 MED ORDER — ESTROGENS, CONJUGATED 0.625 MG/GM VA CREA: 1.0000 | TOPICAL_CREAM | VAGINAL | Status: DC

## 2014-11-21 MED ORDER — CYCLOBENZAPRINE HCL 5 MG PO TABS: 5.0000 mg | ORAL_TABLET | Freq: Three times a day (TID) | ORAL | Status: DC | PRN

## 2014-11-21 MED ORDER — FLUTICASONE PROPIONATE 50 MCG/ACT NA SUSP: 2.0000 | Freq: Every day | NASAL | Status: DC

## 2014-11-21 NOTE — Assessment & Plan Note (Signed)
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 Reviewed xrays from her orthopedic physicians. Will request notes from them. Referral placed for PT. Will restart Tramadol for better pain control. Continue Ibuprofen. Follow up in 3 months and prn.

## 2014-11-21 NOTE — Assessment & Plan Note (Signed)
Chronic atrophic vaginitis. Will change from Estradiol to Premarin because of insurance coverage. Discussed risks of topical estrogen preparations. Follow up prn.

## 2014-11-21 NOTE — Assessment & Plan Note (Signed)
Osteopenia noted on recent DEXA scan. Reviewed with pt. Will check Vit D level with labs. Discussed options for treatment including adequate dietary calcium, weight bearing exercise., and bisphosphonates. For now, will hold on starting bisphosphonates. Repeat Dexa in 2017.

## 2014-11-21 NOTE — Patient Instructions (Addendum)
Start Tramadol 50-160m up to three times daily for back pain.  Health Maintenance Adopting a healthy lifestyle and getting preventive care can go a long way to promote health and wellness. Talk with your health care provider about what schedule of regular examinations is right for you. This is a good chance for you to check in with your provider about disease prevention and staying healthy. In between checkups, there are plenty of things you can do on your own. Experts have done a lot of research about which lifestyle changes and preventive measures are most likely to keep you healthy. Ask your health care provider for more information. WEIGHT AND DIET  Eat a healthy diet  Be sure to include plenty of vegetables, fruits, low-fat dairy products, and lean protein.  Do not eat a lot of foods high in solid fats, added sugars, or salt.  Get regular exercise. This is one of the most important things you can do for your health.  Most adults should exercise for at least 150 minutes each week. The exercise should increase your heart rate and make you sweat (moderate-intensity exercise).  Most adults should also do strengthening exercises at least twice a week. This is in addition to the moderate-intensity exercise.  Maintain a healthy weight  Body mass index (BMI) is a measurement that can be used to identify possible weight problems. It estimates body fat based on height and weight. Your health care provider can help determine your BMI and help you achieve or maintain a healthy weight.  For females 293years of age and older:   A BMI below 18.5 is considered underweight.  A BMI of 18.5 to 24.9 is normal.  A BMI of 25 to 29.9 is considered overweight.  A BMI of 30 and above is considered obese.  Watch levels of cholesterol and blood lipids  You should start having your blood tested for lipids and cholesterol at 62years of age, then have this test every 5 years.  You may need to have your  cholesterol levels checked more often if:  Your lipid or cholesterol levels are high.  You are older than 62years of age.  You are at high risk for heart disease.  CANCER SCREENING   Lung Cancer  Lung cancer screening is recommended for adults 542822years old who are at high risk for lung cancer because of a history of smoking.  A yearly low-dose CT scan of the lungs is recommended for people who:  Currently smoke.  Have quit within the past 15 years.  Have at least a 30-pack-year history of smoking. A pack year is smoking an average of one pack of cigarettes a day for 1 year.  Yearly screening should continue until it has been 15 years since you quit.  Yearly screening should stop if you develop a health problem that would prevent you from having lung cancer treatment.  Breast Cancer  Practice breast self-awareness. This means understanding how your breasts normally appear and feel.  It also means doing regular breast self-exams. Let your health care provider know about any changes, no matter how small.  If you are in your 20s or 30s, you should have a clinical breast exam (CBE) by a health care provider every 1-3 years as part of a regular health exam.  If you are 454or older, have a CBE every year. Also consider having a breast X-ray (mammogram) every year.  If you have a family history of breast cancer, talk  to your health care provider about genetic screening.  If you are at high risk for breast cancer, talk to your health care provider about having an MRI and a mammogram every year.  Breast cancer gene (BRCA) assessment is recommended for women who have family members with BRCA-related cancers. BRCA-related cancers include:  Breast.  Ovarian.  Tubal.  Peritoneal cancers.  Results of the assessment will determine the need for genetic counseling and BRCA1 and BRCA2 testing. Cervical Cancer Routine pelvic examinations to screen for cervical cancer are no  longer recommended for nonpregnant women who are considered low risk for cancer of the pelvic organs (ovaries, uterus, and vagina) and who do not have symptoms. A pelvic examination may be necessary if you have symptoms including those associated with pelvic infections. Ask your health care provider if a screening pelvic exam is right for you.   The Pap test is the screening test for cervical cancer for women who are considered at risk.  If you had a hysterectomy for a problem that was not cancer or a condition that could lead to cancer, then you no longer need Pap tests.  If you are older than 65 years, and you have had normal Pap tests for the past 10 years, you no longer need to have Pap tests.  If you have had past treatment for cervical cancer or a condition that could lead to cancer, you need Pap tests and screening for cancer for at least 20 years after your treatment.  If you no longer get a Pap test, assess your risk factors if they change (such as having a new sexual partner). This can affect whether you should start being screened again.  Some women have medical problems that increase their chance of getting cervical cancer. If this is the case for you, your health care provider may recommend more frequent screening and Pap tests.  The human papillomavirus (HPV) test is another test that may be used for cervical cancer screening. The HPV test looks for the virus that can cause cell changes in the cervix. The cells collected during the Pap test can be tested for HPV.  The HPV test can be used to screen women 84 years of age and older. Getting tested for HPV can extend the interval between normal Pap tests from three to five years.  An HPV test also should be used to screen women of any age who have unclear Pap test results.  After 62 years of age, women should have HPV testing as often as Pap tests.  Colorectal Cancer  This type of cancer can be detected and often  prevented.  Routine colorectal cancer screening usually begins at 62 years of age and continues through 62 years of age.  Your health care provider may recommend screening at an earlier age if you have risk factors for colon cancer.  Your health care provider may also recommend using home test kits to check for hidden blood in the stool.  A small camera at the end of a tube can be used to examine your colon directly (sigmoidoscopy or colonoscopy). This is done to check for the earliest forms of colorectal cancer.  Routine screening usually begins at age 61.  Direct examination of the colon should be repeated every 5-10 years through 62 years of age. However, you may need to be screened more often if early forms of precancerous polyps or small growths are found. Skin Cancer  Check your skin from head to toe regularly.  Tell your health care provider about any new moles or changes in moles, especially if there is a change in a mole's shape or color.  Also tell your health care provider if you have a mole that is larger than the size of a pencil eraser.  Always use sunscreen. Apply sunscreen liberally and repeatedly throughout the day.  Protect yourself by wearing long sleeves, pants, a wide-brimmed hat, and sunglasses whenever you are outside. HEART DISEASE, DIABETES, AND HIGH BLOOD PRESSURE   Have your blood pressure checked at least every 1-2 years. High blood pressure causes heart disease and increases the risk of stroke.  If you are between 72 years and 44 years old, ask your health care provider if you should take aspirin to prevent strokes.  Have regular diabetes screenings. This involves taking a blood sample to check your fasting blood sugar level.  If you are at a normal weight and have a low risk for diabetes, have this test once every three years after 62 years of age.  If you are overweight and have a high risk for diabetes, consider being tested at a younger age or more  often. PREVENTING INFECTION  Hepatitis B  If you have a higher risk for hepatitis B, you should be screened for this virus. You are considered at high risk for hepatitis B if:  You were born in a country where hepatitis B is common. Ask your health care provider which countries are considered high risk.  Your parents were born in a high-risk country, and you have not been immunized against hepatitis B (hepatitis B vaccine).  You have HIV or AIDS.  You use needles to inject street drugs.  You live with someone who has hepatitis B.  You have had sex with someone who has hepatitis B.  You get hemodialysis treatment.  You take certain medicines for conditions, including cancer, organ transplantation, and autoimmune conditions. Hepatitis C  Blood testing is recommended for:  Everyone born from [birth year] through 1964.  Anyone with known risk factors for hepatitis C. Sexually transmitted infections (STIs)  You should be screened for sexually transmitted infections (STIs) including gonorrhea and chlamydia if:  You are sexually active and are younger than 62 years of age.  You are older than 62 years of age and your health care provider tells you that you are at risk for this type of infection.  Your sexual activity has changed since you were last screened and you are at an increased risk for chlamydia or gonorrhea. Ask your health care provider if you are at risk.  If you do not have HIV, but are at risk, it may be recommended that you take a prescription medicine daily to prevent HIV infection. This is called pre-exposure prophylaxis (PrEP). You are considered at risk if:  You are sexually active and do not regularly use condoms or know the HIV status of your partner(s).  You take drugs by injection.  You are sexually active with a partner who has HIV. Talk with your health care provider about whether you are at high risk of being infected with HIV. If you choose to begin PrEP, you  should first be tested for HIV. You should then be tested every 3 months for as long as you are taking PrEP.  PREGNANCY   If you are premenopausal and you may become pregnant, ask your health care provider about preconception counseling.  If you may become pregnant, take 400 to 800 micrograms (mcg) of folic acid  every day.  If you want to prevent pregnancy, talk to your health care provider about birth control (contraception). OSTEOPOROSIS AND MENOPAUSE   Osteoporosis is a disease in which the bones lose minerals and strength with aging. This can result in serious bone fractures. Your risk for osteoporosis can be identified using a bone density scan.  If you are 63 years of age or older, or if you are at risk for osteoporosis and fractures, ask your health care provider if you should be screened.  Ask your health care provider whether you should take a calcium or vitamin D supplement to lower your risk for osteoporosis.  Menopause may have certain physical symptoms and risks.  Hormone replacement therapy may reduce some of these symptoms and risks. Talk to your health care provider about whether hormone replacement therapy is right for you.  HOME CARE INSTRUCTIONS   Schedule regular health, dental, and eye exams.  Stay current with your immunizations.   Do not use any tobacco products including cigarettes, chewing tobacco, or electronic cigarettes.  If you are pregnant, do not drink alcohol.  If you are breastfeeding, limit how much and how often you drink alcohol.  Limit alcohol intake to no more than 1 drink per day for nonpregnant women. One drink equals 12 ounces of beer, 5 ounces of wine, or 1 ounces of hard liquor.  Do not use street drugs.  Do not share needles.  Ask your health care provider for help if you need support or information about quitting drugs.  Tell your health care provider if you often feel depressed.  Tell your health care provider if you have ever  been abused or do not feel safe at home. Document Released: 06/06/2011 Document Revised: 04/07/2014 Document Reviewed: 10/23/2013 [facility] Patient Information 2015 [place], [place]. This information is not intended to replace advice given to you by your health care provider. Make sure you discuss any questions you have with your health care provider.

## 2014-11-21 NOTE — Assessment & Plan Note (Signed)
General medical exam including breast exam normal today, except as noted. Mammogram scheduled for Jan 2016. Colonoscopy ordered. Labs ordered including CBC, CMP, lipids, A1c, TSH, Vit D. Immunizations are UTD.

## 2014-11-21 NOTE — Progress Notes (Signed)
Subjective:    Patient ID: Jill Michael, female    DOB: 1951/06/04, 63 y.o.   MRN: [id number]  HPI 63YO female presents for annual exam.  Recently had bone density testing which showed relatively stable bone density with FRAX risk 14.5% over 10 years.  Also recently seen by orthopedics for neck and back pain. 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 Also has Scoliosis in lumbar spine.  Having persistent low back pain with some "sciatic" pain in left lower back. No pain in legs. No weakness or numbness. Ortho recommended PT and chiropractic care. Would like to set up PT for back pain. Typical pain level 2-3. Made worse by prolonged standing at work. Taking 600mg  ibuprofen in the mornings before work. At lunch time, takes another 400mg  ibuprofen. Then, another 600mg  ibuprofen at bedtime. Doing some stretching at work and lower back exercises at home. Using TENS unit at home. Thinks she may need short term disability.  Concerned about hair loss. Top of scalp. Seen by Dermatologist with some improvement with Zn supplement and topical Monoxidil.  Also concerned about vaginal dryness and pain with intercourse. Using Vagifem with some improvement, however her insurance will no longer cover this. Would like to change to Premarin. Her mother had breast cancer and she understands the risk of topical estrogens.  Past medical, surgical, family and social history per today's encounter.  Review of Systems  Constitutional: Negative for fever, chills, appetite change, fatigue and unexpected weight change.  Eyes: Negative for visual disturbance.  Respiratory: Negative for shortness of breath.   Cardiovascular: Negative for chest pain and leg swelling.  Gastrointestinal: Negative for nausea, vomiting, abdominal pain, diarrhea and constipation.  Genitourinary: Positive for dyspareunia. Negative for dysuria, urgency, frequency, hematuria and pelvic pain.  Musculoskeletal: Positive for myalgias, back pain,  arthralgias and neck pain. Negative for neck stiffness.  Skin: Negative for color change and rash.  Hematological: Negative for adenopathy. Does not bruise/bleed easily.  Psychiatric/Behavioral: Negative for dysphoric mood. The patient is not nervous/anxious.        Objective:    BP 102/66 mmHg  Pulse 90  Temp(Src) 97.9 F (36.6 C) (Oral)  Ht 5\' 6"  (1.676 m)  Wt 170 lb 8 oz (77.338 kg)  BMI 27.53 kg/m2  SpO2 99% Physical Exam  Constitutional: She is oriented to person, place, and time. She appears well-developed and well-nourished. No distress.  HENT:  Head: Normocephalic and atraumatic.  Right Ear: External ear normal.  Left Ear: External ear normal.  Nose: Nose normal.  Mouth/Throat: Oropharynx is clear and moist. No oropharyngeal exudate.  Eyes: Conjunctivae are normal. Pupils are equal, round, and reactive to light. Right eye exhibits no discharge. Left eye exhibits no discharge. No scleral icterus.  Neck: Normal range of motion. Neck supple. No tracheal deviation present. No thyromegaly present.  Cardiovascular: Normal rate, regular rhythm, normal heart sounds and intact distal pulses.  Exam reveals no gallop and no friction rub.   No murmur heard. Pulmonary/Chest: Effort normal and breath sounds normal. No accessory muscle usage. No tachypnea. No respiratory distress. She has no decreased breath sounds. She has no wheezes. She has no rales. She exhibits no tenderness. Right breast exhibits no inverted nipple, no mass, no nipple discharge, no skin change and no tenderness. Left breast exhibits no inverted nipple, no mass, no nipple discharge, no skin change and no tenderness. Breasts are symmetrical.  Abdominal: Soft. Bowel sounds are normal. She exhibits no distension and no mass. There is no tenderness.  There is no rebound and no guarding.  Musculoskeletal: Normal range of motion. She exhibits no edema or tenderness.       Cervical back: She exhibits pain. She exhibits normal  range of motion, no tenderness and no spasm.       Lumbar back: She exhibits pain. She exhibits normal range of motion, no tenderness and no spasm.  Lymphadenopathy:    She has no cervical adenopathy.  Neurological: She is alert and oriented to person, place, and time. No cranial nerve deficit. She exhibits normal muscle tone. Coordination normal.  Skin: Skin is warm and dry. No rash noted. She is not diaphoretic. No erythema. No pallor.  Psychiatric: She has a normal mood and affect. Her behavior is normal. Judgment and thought content normal.          Assessment & Plan:   Problem List Items Addressed This Visit      Unprioritized   Allergic rhinitis    Symptoms well controlled with Flonase. Will continue.    Relevant Medications      fluticasone (FLONASE) 50 MCG/ACT nasal spray   Atrophic vaginitis    Chronic atrophic vaginitis. Will change from Estradiol to Premarin because of insurance coverage. Discussed risks of topical estrogen preparations. Follow up prn.    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 Reviewed xrays from her orthopedic physicians. Will request notes from them. Referral placed for PT. Will restart Tramadol for better pain control. Continue Ibuprofen. Follow up in 3 months and prn.    Relevant Medications      traMADol (ULTRAM) 50 MG tablet      cyclobenzaprine (FLEXERIL) tablet   Other Relevant Orders      Ambulatory referral to Physical Therapy   Osteopenia    Osteopenia noted on recent DEXA scan. Reviewed with pt. Will check Vit D level with labs. Discussed options for treatment including adequate dietary calcium, weight bearing exercise., and bisphosphonates. For now, will hold on starting bisphosphonates. Repeat Dexa in 2017.    Routine general medical examination at a health care facility - Primary    General medical exam including breast exam normal today, except as noted. Mammogram scheduled for Jan 2016. Colonoscopy  ordered. Labs ordered including CBC, CMP, lipids, A1c, TSH, Vit D. Immunizations are UTD.     Other Visit Diagnoses    Screening for colon cancer        Relevant Orders       Ambulatory referral to Gastroenterology        Return in about 3 months (around 02/20/2015) for Recheck.

## 2014-11-21 NOTE — Assessment & Plan Note (Signed)
Symptoms well controlled with Flonase. Will continue.

## 2014-11-21 NOTE — Progress Notes (Signed)
Pre visit review using our clinic review tool, if applicable. No additional management support is needed unless otherwise documented below in the visit note. 

## 2014-11-24 ENCOUNTER — Other Ambulatory Visit: Payer: Self-pay | Admitting: *Deleted

## 2014-11-24 ENCOUNTER — Telehealth: Payer: Self-pay | Admitting: Internal Medicine

## 2014-11-24 MED ORDER — ESTROGENS, CONJUGATED 0.625 MG/GM VA CREA: 1.0000 | TOPICAL_CREAM | VAGINAL | Status: DC

## 2014-11-24 NOTE — Telephone Encounter (Signed)
Fine to refill 

## 2014-11-24 NOTE — Telephone Encounter (Signed)
Rx sent 

## 2014-11-24 NOTE — Telephone Encounter (Signed)
Patient request written rx for Premarin Cream   For mail order

## 2014-11-24 NOTE — Telephone Encounter (Signed)
Pt needs Rx sent to [facility] instead of [facility]

## 2014-12-01 ENCOUNTER — Encounter: Payer: Self-pay | Admitting: Internal Medicine

## 2014-12-05 ENCOUNTER — Encounter: Payer: Self-pay | Admitting: Internal Medicine

## 2014-12-12 ENCOUNTER — Ambulatory Visit: Payer: Self-pay | Admitting: Internal Medicine

## 2014-12-12 LAB — HM MAMMOGRAPHY: HM MAMMO: NEGATIVE

## 2014-12-15 ENCOUNTER — Encounter: Payer: Self-pay | Admitting: *Deleted

## 2014-12-31 ENCOUNTER — Encounter: Payer: Self-pay | Admitting: Internal Medicine

## 2015-01-23 LAB — BASIC METABOLIC PANEL
BUN: 18 mg/dL (ref 4–21)
CREATININE: 0.7 mg/dL (ref 0.5–1.1)
Glucose: 97 mg/dL
Potassium: 4.6 mmol/L (ref 3.4–5.3)
Sodium: 140 mmol/L (ref 137–147)

## 2015-01-23 LAB — LIPID PANEL
Cholesterol: 169 mg/dL (ref 0–200)
HDL: 49 mg/dL (ref 35–70)
LDL CALC: 103 mg/dL
TRIGLYCERIDES: 85 mg/dL (ref 40–160)

## 2015-01-23 LAB — TSH: TSH: 2.4 u[IU]/mL (ref 0.41–5.90)

## 2015-01-23 LAB — CBC AND DIFFERENTIAL
HEMATOCRIT: 39 % (ref 36–46)
HEMOGLOBIN: 14.2 g/dL (ref 12.0–16.0)
Neutrophils Absolute: 2 /uL
Platelets: 225 10*3/uL (ref 150–399)
WBC: 4.5 10^3/mL

## 2015-01-23 LAB — HEPATIC FUNCTION PANEL
ALT: 15 U/L (ref 7–35)
AST: 22 U/L (ref 13–35)
Alkaline Phosphatase: 48 U/L (ref 25–125)
BILIRUBIN, TOTAL: 0.4 mg/dL

## 2015-01-23 LAB — HEMOGLOBIN A1C: HEMOGLOBIN A1C: 5.3 % (ref 4.0–6.0)

## 2015-02-04 ENCOUNTER — Encounter: Payer: Self-pay | Admitting: *Deleted

## 2015-02-06 ENCOUNTER — Ambulatory Visit: Payer: Self-pay | Admitting: Gastroenterology

## 2015-02-06 LAB — HM COLONOSCOPY

## 2015-02-20 ENCOUNTER — Encounter: Payer: Self-pay | Admitting: Internal Medicine

## 2015-02-20 ENCOUNTER — Ambulatory Visit (INDEPENDENT_AMBULATORY_CARE_PROVIDER_SITE_OTHER): Payer: Managed Care, Other (non HMO) | Admitting: Internal Medicine

## 2015-02-20 VITALS — BP 113/76 | HR 85 | Temp 98.1°F | Ht 66.0 in | Wt 170.4 lb

## 2015-02-20 DIAGNOSIS — G8929 Other chronic pain: Secondary | ICD-10-CM

## 2015-02-20 DIAGNOSIS — M545 Low back pain: Secondary | ICD-10-CM

## 2015-02-20 MED ORDER — CYCLOSPORINE 0.05 % OP EMUL: 1.0000 [drp] | Freq: Two times a day (BID) | OPHTHALMIC | Status: DC

## 2015-02-20 NOTE — Progress Notes (Signed)
   Subjective:    Patient ID: Jill Michael, female    DOB: 10-31-1951, 64 y.o.   MRN: [id number]  HPI  64YO female presents for follow up.  Last seen in 11/2014 for annual exam.  Chronic low back pain - Much improved after PT at [facility]. Started using a 1/4 in lift in left shoe with some improvement. Using back support at work. Doing core strengthening exercises. No longer taking Flexeril. Using Ibuprofen 600mg  in the morning. Started Tumeric three times per day with huge improvement.  Past medical, surgical, family and social history per today's encounter.  Review of Systems  Constitutional: Negative for fever, chills, appetite change, fatigue and unexpected weight change.  Eyes: Negative for visual disturbance.  Respiratory: Negative for shortness of breath.   Cardiovascular: Negative for chest pain and leg swelling.  Gastrointestinal: Negative for abdominal pain.  Musculoskeletal: Negative for myalgias, back pain, arthralgias and neck pain.  Skin: Negative for color change and rash.  Hematological: Negative for adenopathy. Does not bruise/bleed easily.  Psychiatric/Behavioral: Negative for sleep disturbance and dysphoric mood. The patient is not nervous/anxious.        Objective:    BP 113/76 mmHg  Pulse 85  Temp(Src) 98.1 F (36.7 C) (Oral)  Ht 5\' 6"  (1.676 m)  Wt 170 lb 6 oz (77.282 kg)  BMI 27.51 kg/m2  SpO2 99% Physical Exam  Constitutional: She is oriented to person, place, and time. She appears well-developed and well-nourished. No distress.  HENT:  Head: Normocephalic and atraumatic.  Right Ear: External ear normal.  Left Ear: External ear normal.  Nose: Nose normal.  Mouth/Throat: Oropharynx is clear and moist. No oropharyngeal exudate.  Eyes: Conjunctivae are normal. Pupils are equal, round, and reactive to light. Right eye exhibits no discharge. Left eye exhibits no discharge. No scleral icterus.  Neck: Normal range of motion. Neck supple. No tracheal  deviation present. No thyromegaly present.  Cardiovascular: Normal rate, regular rhythm, normal heart sounds and intact distal pulses.  Exam reveals no gallop and no friction rub.   No murmur heard. Pulmonary/Chest: Effort normal and breath sounds normal. No respiratory distress. She has no wheezes. She has no rales. She exhibits no tenderness.  Musculoskeletal: Normal range of motion. She exhibits no edema or tenderness.  Lymphadenopathy:    She has no cervical adenopathy.  Neurological: She is alert and oriented to person, place, and time. No cranial nerve deficit. She exhibits normal muscle tone. Coordination normal.  Skin: Skin is warm and dry. No rash noted. She is not diaphoretic. No erythema. No pallor.  Psychiatric: She has a normal mood and affect. Her behavior is normal. Judgment and thought content normal.          Assessment & Plan:   Problem List Items Addressed This Visit      Unprioritized   Chronic low back pain - Primary    Back pain much improved with PT and use of lift in left shoe and lumbar support, as well as Tumeric. Follow up prn.          Return in about 9 months (around 11/22/2015) for Physical.

## 2015-02-20 NOTE — Progress Notes (Signed)
Pre visit review using our clinic review tool, if applicable. No additional management support is needed unless otherwise documented below in the visit note. 

## 2015-02-20 NOTE — Patient Instructions (Signed)
Labs including Vit D.

## 2015-02-20 NOTE — Assessment & Plan Note (Signed)
Back pain much improved with PT and use of lift in left shoe and lumbar support, as well as Tumeric. Follow up prn.

## 2015-03-20 LAB — BASIC METABOLIC PANEL
BUN: 18 mg/dL (ref 4–21)
Creatinine: 0.8 mg/dL (ref 0.5–1.1)
Glucose: 105 mg/dL
Potassium: 4.8 mmol/L (ref 3.4–5.3)
SODIUM: 141 mmol/L (ref 137–147)

## 2015-03-24 ENCOUNTER — Encounter: Payer: Self-pay | Admitting: Internal Medicine

## 2015-04-07 ENCOUNTER — Encounter: Payer: Self-pay | Admitting: Internal Medicine

## 2015-04-08 ENCOUNTER — Encounter: Payer: Self-pay | Admitting: *Deleted

## 2015-04-16 ENCOUNTER — Encounter: Payer: Self-pay | Admitting: Internal Medicine

## 2015-05-19 ENCOUNTER — Ambulatory Visit (INDEPENDENT_AMBULATORY_CARE_PROVIDER_SITE_OTHER): Payer: Managed Care, Other (non HMO) | Admitting: Nurse Practitioner

## 2015-05-19 ENCOUNTER — Encounter: Payer: Self-pay | Admitting: Nurse Practitioner

## 2015-05-19 VITALS — BP 114/72 | HR 91 | Temp 98.0°F | Resp 12 | Ht 66.0 in | Wt 170.8 lb

## 2015-05-19 DIAGNOSIS — G8929 Other chronic pain: Secondary | ICD-10-CM | POA: Diagnosis not present

## 2015-05-19 DIAGNOSIS — M545 Low back pain, unspecified: Secondary | ICD-10-CM

## 2015-05-19 MED ORDER — CYCLOBENZAPRINE HCL 5 MG PO TABS: 5.0000 mg | ORAL_TABLET | Freq: Three times a day (TID) | ORAL | Status: DC | PRN

## 2015-05-19 MED ORDER — PREDNISONE 10 MG (21) PO TBPK: 10.0000 mg | ORAL_TABLET | Freq: Every day | ORAL | Status: DC

## 2015-05-19 NOTE — Patient Instructions (Signed)
We will contact you about your referral to PT.   Prednisone with breakfast  6 tablets on day 1, 5 tablets on day 2, 4 tablets on day 3, 3 tablets on day 4, 2 tablets day 5, 1 tablet on day 6...done!

## 2015-05-19 NOTE — Progress Notes (Signed)
   Subjective:    Patient ID: Jill Michael, female    DOB: [birth year], 64 y.o.   MRN: [id number]  HPI  Jill Michael is a 64 yo female with a CC of muscle spasms and ear problems.   1) Muscle spasm- 3 years ago strained lower back, PT Dec-Jan, continuing with exercises, has a machine to peddle on the floor- it was sticking and had to push hard, knees and back started bothering her after this happened around [place] day, cleaned the house on Sat. Felt on Sunday a muscle spasm, Flexeril and ibuprofen, took out her lumbar support pillow and it feels worse today   Chiropractor every 2 weeks.   2) Fluid feeling in Right ear x 1 month OTC sudafed helpful    Review of Systems  Constitutional: Negative for fever, chills, diaphoresis and fatigue.  HENT: Positive for ear pain. Negative for ear discharge.   Respiratory: Negative for chest tightness, shortness of breath and wheezing.   Cardiovascular: Negative for chest pain, palpitations and leg swelling.  Gastrointestinal: Negative for nausea, vomiting, diarrhea and rectal pain.  Musculoskeletal: Positive for myalgias, back pain and neck pain. Negative for arthralgias and neck stiffness.  Skin: Negative for rash.  Neurological: Negative for dizziness, weakness, numbness and headaches.  Psychiatric/Behavioral: The patient is not nervous/anxious.       Objective:   Physical Exam  Constitutional: She is oriented to person, place, and time. She appears well-developed and well-nourished. No distress.  BP 114/72 mmHg  Pulse 91  Temp(Src) 98 F (36.7 C) (Oral)  Resp 12  Ht 5\' 6"  (1.676 m)  Wt 170 lb 12.8 oz (77.474 kg)  BMI 27.58 kg/m2  SpO2 96%   HENT:  Head: Normocephalic and atraumatic.  Right Ear: External ear normal.  Left Ear: External ear normal.  TM's clear bilaterally  Cardiovascular: Normal rate, regular rhythm, normal heart sounds and intact distal pulses.  Exam reveals no gallop and no friction rub.   No murmur  heard. Pulmonary/Chest: Effort normal and breath sounds normal. No respiratory distress. She has no wheezes. She has no rales. She exhibits no tenderness.  Musculoskeletal: She exhibits tenderness. She exhibits no edema.  Muscles are tight paraspinally in lumbosacral area  Neurological: She is alert and oriented to person, place, and time. No cranial nerve deficit. She exhibits normal muscle tone. Coordination normal.  Skin: Skin is warm and dry. No rash noted. She is not diaphoretic.  Psychiatric: She has a normal mood and affect. Her behavior is normal. Judgment and thought content normal.      Assessment & Plan:

## 2015-05-19 NOTE — Progress Notes (Signed)
Pre visit review using our clinic review tool, if applicable. No additional management support is needed unless otherwise documented below in the visit note. 

## 2015-05-24 NOTE — Assessment & Plan Note (Signed)
PT was very helpful in past. Would like another round. Also, start prednisone taper for decreasing inflammation and flexeril for relaxing at night. FU prn worsening/failure to improve.

## 2015-06-10 ENCOUNTER — Ambulatory Visit: Payer: Managed Care, Other (non HMO) | Attending: Nurse Practitioner

## 2015-06-10 DIAGNOSIS — M545 Low back pain, unspecified: Secondary | ICD-10-CM

## 2015-06-11 NOTE — Patient Instructions (Signed)
[email or web address] Shoulder extension with red band 2x10 Wall slides with lift 2x10 Bilateral rows with red band 2x10 Knees to chest stretch 3x30 sec Sunrise stretch on left 3x30-45 sec

## 2015-06-11 NOTE — Therapy (Signed)
[place] MAIN [facility] SERVICES [address] [place], [place], [address] Phone: [phone]   Fax:  [phone]  Physical Therapy Evaluation  Patient Details  Name: Jill Michael MRN: [id number] Date of Birth: 05-18-1951 Referring Provider:  Rubbie Battiest, NP  Encounter Date: 06/10/2015      PT End of Session - 06/11/15 1155    Visit Number 1   Number of Visits 1   Date for PT Re-Evaluation 06/10/15   PT Start Time 1620   PT Stop Time 1730   PT Time Calculation (min) 70 min   Activity Tolerance Patient tolerated treatment well   Behavior During Therapy [facility] for tasks assessed/performed      Past Medical History  Diagnosis Date  . Arthritis   . Chicken pox   . Allergy     hay fever  . UTI (urinary tract infection)   . Low back pain     recurrent  . Arthritis     right knee, followed by Jefm Bryant  . Scoliosis     Past Surgical History  Procedure Laterality Date  . Nasal septum surgery    . Tubal ligation    . Breast biopsy  2004    fibroadenoma right breast  . Vaginal delivery      2  . Partial knee replacement Right 2013    Dr. Jefm Bryant    There were no vitals filed for this visit.  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  pt relates she had partical R TKA and has leg length discrepancy with R LE longer and uses heel left on left LE.  pt relates she had PT services last November to treat her LBP and has been consistently doing her HEP since her discharge from PT.  pt relates she started experiencing L LBP in April after she started using a lumbar support that cuased her to have muscle spasms and anterior left hip pain. pt relates she uses the lumbar support to help decrease stress on back due to prolonged sitting at  work.  pt reports after adjusting lumbar support and taking breaks from sitting at work, she is almost back to baseline.  pt would like see if we could update her HEP help improve her remaining deficits.    Currently in Pain? Yes   Pain Score 1    Pain Location Back   Pain Orientation Left   Pain Descriptors / Indicators Aching   Aggravating Factors  prolonged sitting   Pain Relieving Factors walking             [facility] PT Assessment - 06/11/15 0001    Assessment   Medical Diagnosis chronic LBP   Onset Date/Surgical Date 03/12/15   Hand Dominance Right   Prior Therapy PT in November of 2015   Precautions   Precautions None   Restrictions   Weight Bearing Restrictions No   Balance Screen   Has the patient fallen in the past 6 months No   Has the patient had a decrease in activity level because of a fear of falling?  No   Is the patient reluctant to leave their home because of a fear of falling?  No   Home Environment   Living  Environment Private residence   Armed forces logistics/support/administrative officer other   Available Help at Discharge Family   Type of [place] to enter   Entrance Stairs-Number of Steps 2   [place] Two level   [place] - 2 wheels;Cane - quad;Toilet riser   Prior Function   Level of Independence Independent   Vocation Full time employment   Vocation Requirements prolonged sitting   Cognition   Overall Cognitive Status Within Functional Limits for tasks assessed   Observation/Other Assessments   Skin Integrity intact   Sensation   Light Touch Appears Intact   Coordination   Gross Motor Movements are Fluid and Coordinated Yes   Posture/Postural Control   Posture/Postural Control No significant limitations   ROM / Strength   AROM / PROM / Strength Strength;AROM   AROM   Overall AROM  Within functional limits for tasks performed   Strength   Overall Strength Within functional limits for  tasks performed;Deficits   Strength Assessment Site Hip   Right/Left Hip Right;Left   Right Hip Flexion 4/5   Left Hip Flexion 4/5   Palpation   Palpation comment patient is tender on left lower erector spinae and PSIS   Special Tests    Special Tests Leg LengthTest;Sacrolliac Tests   Sacroiliac Tests  --  negative pelvic compression, pelvic distraction and sacral t       bilateral LE myotome/dermotome: WNL Lumbar AROM: WNL Repeated lumbar flexion/extension x10: no change in symptoms SI provocative tests: Compression: negative Distraction: negative and tender to pressure Thigh thrust: negative Palpation: tender left lower erector spinae and PSIS Bilateral LE MMT: 5/5 except bilateral hip flexion 4/5  Sunrise stretch: x30 sec Knees to chest: x30 sec Pt experienced relief in her lower back and required verbal cues for correct technique Wall slides with lift x 10 Shoulder extension wit red band x10 Low rows with red band x10 Pt required verbal and tactile cues for the above UE exercises                      PT Education - 06/11/15 1155    Education provided Yes   Education Details to increase stretches and to perform them after completing her HEP.    Person(s) Educated Patient   Methods Explanation   Comprehension Verbalized understanding             PT Long Term Goals - 06/11/15 1206    PT LONG TERM GOAL #1   Title pt will be able to perform HEP independently with correct form.    Baseline pt is able to perform HEp independently with correct form and no increase in pain.    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  pt's complaint of left low back pain has essentially returned to baseline and is not currently limiting her with functional movements.  pt  is tender/tight to palpation in left lower erector spinale and PSIS and experienced relief with the stretches performed today. Based on patient's response to stretces and exercises today, she would benefit from continuing these exercises as her HEP and no further PT  skilled services required at this time.       Rehab Potential Excellent   Consulted and Agree with Plan of Care Patient         Problem List Patient Active Problem List   Diagnosis Date Noted  . Chronic low back pain 11/21/2014  . Osteopenia 11/21/2014  . Routine general medical examination at a health care facility 07/19/2013  . Atrophic vaginitis 07/16/2012  . Allergic rhinitis 07/16/2012   Renford Dills, SPT Renford Dills 06/11/2015, 12:07 PM This entire session was performed under direct supervision and direction of a licensed therapist/therapist assistant . I have personally read, edited and approve of the note as written. Gorden Harms. [name], PT, DPT [phone]  [place] MAIN [facility] SERVICES [address] [place], [place], [address] Phone: [phone]   Fax:  [phone]

## 2015-07-20 ENCOUNTER — Telehealth: Payer: Self-pay | Admitting: Internal Medicine

## 2015-07-20 NOTE — Telephone Encounter (Signed)
Pt has been having low grade back pain and stiffness. Pt has been taking intermittent FMLA and wanted to take short disability and a physician has to fill it out. She was not sure if she needs to talk to the doctor first? Thank You!

## 2015-07-20 NOTE — Telephone Encounter (Signed)
Spoke with pt, scheduled appoint

## 2015-07-23 ENCOUNTER — Ambulatory Visit: Payer: Managed Care, Other (non HMO) | Admitting: Internal Medicine

## 2015-08-17 ENCOUNTER — Ambulatory Visit: Payer: Managed Care, Other (non HMO) | Attending: Orthopaedic Surgery

## 2015-08-17 DIAGNOSIS — M545 Low back pain, unspecified: Secondary | ICD-10-CM

## 2015-08-17 NOTE — Therapy (Signed)
[place] MAIN [facility] SERVICES [address] [place], [place], [address] Phone: [phone]   Fax:  [phone]  Physical Therapy Evaluation  Patient Details  Name: Jill Jeanbaptiste MRN: [id number] Date of Birth: 06-23-1951 Referring Provider:  Starling Manns, MD  Encounter Date: 08/17/2015      PT End of Session - 08/17/15 1625    Visit Number 1   Number of Visits 7   Date for PT Re-Evaluation 09/07/15   PT Start Time 1430   PT Stop Time 1535   PT Time Calculation (min) 65 min   Activity Tolerance Patient tolerated treatment well   Behavior During Therapy [facility] for tasks assessed/performed;Anxious      Past Medical History  Diagnosis Date  . Arthritis   . Chicken pox   . Allergy     hay fever  . UTI (urinary tract infection)   . Low back pain     recurrent  . Arthritis     right knee, followed by Jefm Bryant  . Scoliosis     Past Surgical History  Procedure Laterality Date  . Nasal septum surgery    . Tubal ligation    . Breast biopsy  2004    fibroadenoma right breast  . Vaginal delivery      2  . Partial knee replacement Right 2013    Dr. Jefm Bryant    There were no vitals filed for this visit.  Visit Diagnosis:  Left-sided low back pain without sciatica - Plan: PT plan of care cert/re-cert      Subjective Assessment - 08/17/15 1613    Subjective pt reports she was diagnosed with scolosis at 64 years old in the lumbar area and in November of 64  pt relates she had partical R TKA and has leg length discrepancy with R LE longer and uses heel left on left LE.  pt relates she had PT services last November to treat her LBP and has been consistently doing her HEP since her discharge from PT.  pt relates she started experiencing L LBP in April after she started using a lumbar support that caused her to have muscle spasms and anterior left hip pain. pt relates she was using lumbar support to help decrease stress on back due to  prolonged sitting at work, but it hurt her back and now just uses an orthopedic cushion.  pt reports she is now having more L posterior and anterior hip pain and pain in hte L sacral area with prolonged sitting.  pt  denies numbness/tingling and reports pain does not go past her hip. she reports she recently had a cortisone injection to her L hip for bursitis, which seemed to help. pt also denies feelings of weakness and change in bowel bladder.    Limitations Standing;Lifting;Sitting;Walking   Patient Stated Goals reduce pain, return to PLOF   Currently in Pain? Yes   Pain Score 3   posterior hip/L sacral area, intermirrant L groin   Aggravating Factors  prolonged sitting            [facility] PT Assessment - 08/17/15 1619    Assessment   Medical Diagnosis scoliosis   Onset Date/Surgical Date 03/12/15   Hand Dominance Right   Prior Therapy PT   Precautions   Precautions None   Restrictions   Weight Bearing Restrictions No   Balance Screen   Has the patient fallen in the past 6 months No   Has the patient had  a decrease in activity level because of a fear of falling?  No   Is the patient reluctant to leave their home because of a fear of falling?  Yes   [place] Private residence   Living Arrangements Spouse/significant other   Available Help at Discharge Family   Type of [place] to enter   Entrance Stairs-Number of Steps 2   Entrance Stairs-Rails None   Home Layout Two level   Prior Function   Level of Independence Independent;Independent with basic ADLs  pt power walked for exercise    Vocation Full time employment  on short term disability, prolonged sitting    Cognition   Overall Cognitive Status Within Functional Limits for tasks assessed  anxious   Observation/Other Assessments   Observations --  pt sits with fair posture, reduced lumbar lordosis   Sensation   Light Touch Appears Intact   Special Tests    Special  Tests Lumbar;Leg LengthTest;Sacrolliac Tests;Hip Special Tests   Lumbar Tests Slump Test   Sacroiliac Tests  Sacral Thrust   Leg length test  other  has hee lift   Hip Special Tests  Saralyn Pilar (FABER) Test;Ober's Test;Hip Scouring;Piriformis Test   Slump test   Findings Positive   Side Left   Pelvic Dictraction   Findings Negative   Pelvic Compression   Findings Negative   Sacral thrust    Findings Negative   Saralyn Pilar (FABER) Test   Findings Negative   Ober's Test   Findings Positive   Side Left   Hip Scouring   Findings Negative   Side Left   Piriformis Test   Findings Negative   Side  Left      lumbar ROM: Flexion: min loss Extension mod loss sideglide L mod loss Side glide R mod loss  Repeated lumbar fleixon x10 increased and peripheralized pain to L ishium Repeated lumbar extension resolved pain x 10  Myotomes: WNL with exception of L2 motor weakness Dermatomes WNL bilaterally Reflexion 0+ bilaterally L3 and S1 (-) clonus  Palpation, pt is tender over spinous process- not reproducing hip sx Very tender IT band Tender L piriformis Tender over L>R greater trochanters.                       PT Education - 08/17/15 1624    Education provided Yes   Education Details avoid repeated flexion, posture ed   Person(s) Educated Patient   Methods Explanation;Handout   Comprehension Verbalized understanding             PT Long Term Goals - 08/17/15 1632    PT LONG TERM GOAL #1   Title pt will be able to sit with proper posture x 1 hour without increased hip / back pain to return to work activities    Time 3   Period Weeks   Status New   PT LONG TERM GOAL #2   Title pt will be able to bend to pick up a light item without increased pain   Time 3   Period Weeks   Status New   PT LONG TERM GOAL #3   Title pt will demonstrate proper lifting mechanics to lift a 15lb item from the floor x 5 without pain in the back.    PT LONG TERM GOAL #4    Title pt will be able to walk >1.85m/s x 30 min without increased pain to return to PLOF  Time 3   Period Weeks   Status New               Plan - 08/17/15 1626    Clinical Impression Statement pt presents to therapy with gradually increasing lower back pain since april, now extending into the posterior and anterior hip. pt does have + tenderness over bilateral greater trochanters L>R and quite tender and tight IT bands bilaterally. She did show a strong extension preference following repeated motion testing with peripheralizing and worsening posterior hip pain with flexion as well as relief with extension. pt would benefit from continued skilled PT services to address pain, strength, posture, ROM to return pt to PLOF.    Rehab Potential Good   PT Frequency 2x / week   PT Duration 3 weeks   PT Treatment/Interventions Electrical Stimulation;Cryotherapy;Moist Heat;Therapeutic exercise;Therapeutic activities;Gait training;Traction;Ultrasound;Neuromuscular re-education;Patient/family education;Manual techniques;Taping;Dry needling;Passive range of motion   Consulted and Agree with Plan of Care Patient         Problem List Patient Active Problem List   Diagnosis Date Noted  . Chronic low back pain 11/21/2014  . Osteopenia 11/21/2014  . Routine general medical examination at a health care facility 07/19/2013  . Atrophic vaginitis 07/16/2012  . Allergic rhinitis 07/16/2012   Gorden Harms. Leylah Tarnow, PT, DPT [phone]  Antwaine Boomhower 08/17/2015, 4:35 PM  [place] MAIN [facility] SERVICES [address] [place], [place], [address] Phone: [phone]   Fax:  [phone]

## 2015-08-17 NOTE — Patient Instructions (Signed)
[email or web address] Sciatic nerve flossing x 20 (2xday) PPU x 10 (every orther hour) ITB roll with rolling pin x 5 min 2x/day

## 2015-08-24 ENCOUNTER — Ambulatory Visit: Payer: Managed Care, Other (non HMO)

## 2015-08-24 DIAGNOSIS — M545 Low back pain, unspecified: Secondary | ICD-10-CM

## 2015-08-25 NOTE — Therapy (Signed)
[place] MAIN [facility] SERVICES [address] [place], [place], [address] Phone: [phone]   Fax:  [phone]  Physical Therapy Treatment  Patient Details  Name: Jill Michael MRN: [id number] Date of Birth: 1951/09/10 Referring Provider:  Starling Manns, MD  Encounter Date: 08/24/2015      PT End of Session - 08/25/15 0825    Visit Number 2   Number of Visits 7   Date for PT Re-Evaluation 09/07/15   PT Start Time 0845   PT Stop Time 0930   PT Time Calculation (min) 45 min   Activity Tolerance Patient tolerated treatment well   Behavior During Therapy [facility] for tasks assessed/performed;Anxious      Past Medical History  Diagnosis Date  . Arthritis   . Chicken pox   . Allergy     hay fever  . UTI (urinary tract infection)   . Low back pain     recurrent  . Arthritis     right knee, followed by Jefm Bryant  . Scoliosis     Past Surgical History  Procedure Laterality Date  . Nasal septum surgery    . Tubal ligation    . Breast biopsy  2004    fibroadenoma right breast  . Vaginal delivery      2  . Partial knee replacement Right 2013    Dr. Jefm Bryant    There were no vitals filed for this visit.  Visit Diagnosis:  Left-sided low back pain without sciatica      Subjective Assessment - 08/25/15 0824    Subjective pt reports she has been doing the therex, however not as many repititions as recommended due to muscle soreness. pt reports she was able to sit for almost 3 hours without increased back pain. pt reports she tried to go for a walk but had anterior thigh and groin pain about 1/3 mile in.    Limitations Standing;Lifting;Sitting;Walking   Patient Stated Goals reduce pain, return to PLOF   Currently in Pain? Yes   Pain Score 3    Pain Location --  lower back          Manual therapy: L hip mobilizations grade 3-4, anterior, inferior, lateral and posterior mobilizations 3 bouts of 20-30s Prone hip flexor stretch  : (contract /relax) 2x66min  Therex: Pt performed hip flexor stretch in prone with belt 2x30s Pt performed repeated hip extension first in lunge position, then modified to standing per pt request 2x10 Pt then was asked to ambulate at her walking pace.(355ft) pt reported less tightness in the hip and less groin pain.                             PT Long Term Goals - 08/17/15 1632    PT LONG TERM GOAL #1   Title pt will be able to sit with proper posture x 1 hour without increased hip / back pain to return to work activities    Time 3   Period Weeks   Status New   PT LONG TERM GOAL #2   Title pt will be able to bend to pick up a light item without increased pain   Time 3   Period Weeks   Status New   PT LONG TERM GOAL #3   Title pt will demonstrate proper lifting mechanics to lift a 15lb item from the floor x 5 without pain in the back.  PT LONG TERM GOAL #4   Title pt will be able to walk >1.69m/s x 30 min without increased pain to return to PLOF   Time 3   Period Weeks   Status New               Plan - 08/25/15 5993    Clinical Impression Statement pt responded well to manual therapy today. pt was given prone hip flexor stretch and repeated L hip extension for HEP. PT modifed treatment multiple times today due to pt request for positioning etc. pt reported less hip pain when walking following treatment today.    Rehab Potential Good   PT Frequency 2x / week   PT Duration 3 weeks   PT Treatment/Interventions Electrical Stimulation;Cryotherapy;Moist Heat;Therapeutic exercise;Therapeutic activities;Gait training;Traction;Ultrasound;Neuromuscular re-education;Patient/family education;Manual techniques;Taping;Dry needling;Passive range of motion   Consulted and Agree with Plan of Care Patient        Problem List Patient Active Problem List   Diagnosis Date Noted  . Chronic low back pain 11/21/2014  . Osteopenia 11/21/2014  . Routine general  medical examination at a health care facility 07/19/2013  . Atrophic vaginitis 07/16/2012  . Allergic rhinitis 07/16/2012   Gorden Harms. [name], PT, DPT [phone]  [name],[name] 08/25/2015, 8:28 AM  [place] MAIN [facility] SERVICES [address] [place], [place], [address] Phone: [phone]   Fax:  [phone]

## 2015-08-26 ENCOUNTER — Ambulatory Visit: Payer: Managed Care, Other (non HMO)

## 2015-08-28 ENCOUNTER — Ambulatory Visit: Payer: Managed Care, Other (non HMO) | Admitting: Internal Medicine

## 2015-08-31 ENCOUNTER — Ambulatory Visit: Payer: Managed Care, Other (non HMO)

## 2015-08-31 DIAGNOSIS — M545 Low back pain, unspecified: Secondary | ICD-10-CM

## 2015-08-31 NOTE — Therapy (Signed)
[place] MAIN [facility] SERVICES [address] [place], [place], [address] Phone: [phone]   Fax:  [phone]  Physical Therapy Treatment  Patient Details  Name: Jill Michael MRN: [id number] Date of Birth: 1950/12/10 Referring Provider:  Starling Manns, MD  Encounter Date: 08/31/2015      PT End of Session - 08/31/15 1619    Visit Number 3   Number of Visits 7   Date for PT Re-Evaluation 09/07/15   PT Start Time 0800   PT Stop Time 0850   PT Time Calculation (min) 50 min   Activity Tolerance Patient tolerated treatment well   Behavior During Therapy [facility] for tasks assessed/performed;Anxious      Past Medical History  Diagnosis Date  . Arthritis   . Chicken pox   . Allergy     hay fever  . UTI (urinary tract infection)   . Low back pain     recurrent  . Arthritis     right knee, followed by Jefm Bryant  . Scoliosis     Past Surgical History  Procedure Laterality Date  . Nasal septum surgery    . Tubal ligation    . Breast biopsy  2004    fibroadenoma right breast  . Vaginal delivery      2  . Partial knee replacement Right 2013    Dr. Jefm Bryant    There were no vitals filed for this visit.  Visit Diagnosis:  Left-sided low back pain without sciatica      Subjective Assessment - 08/31/15 1616    Subjective pt reports her L knee was sore after last session when she was in the kneeling position. Her knee was sore which is why she cancelled last appointment. regarding her primary complaints she reports she is feeling a great deal better. she is having little pain in sitting. She reports her lateral hip pain is much better and her anterior hip pain is feeling better. she reports her lower back pain is much more locallized to a small area just L of the sacrum and is less frequent. pt reports she has started water exercises which felt fine and she enjoyed.    Limitations Standing;Lifting;Sitting;Walking   Patient Stated Goals  reduce pain, return to PLOF   Currently in Pain? No/denies      Therex: Double knee to chest 15s x 10  LTR 5s x 10 Piriformis stretch both ER/IR 30s x 2 each leg Sitting on Tball: low row, mid row, horizontal shoulder abduction, shoulder ER : red band 2x10 each. Pt had pain with shoulder ER in her lower back Prone on elbows x  2 min Repeated extension in standing  x 10 Pt requires min to mod verbal and tactile cues for proper exercise performance    Theract:x 8 min Pt instruction and demonstration of sweeping and vacuuming minimising lumbar flexion.  Pt performed with min verbal and tactile cues  Verbal instruction/ education of lifting mechanics. Pt elected not to perform as she did not want to injure herself, but verbalized understanding  Manual therapy MFR to thoracolumbar fascia x 5 min Grade 1-2 CPA glides to lumbar spine 20s x 2 each level Sacral distraction, nutation and counter-nutation grade 2 2 bouts of 20s Pt tender and hypersensitive over L4-5                                PT Long Term  Goals - 08/17/15 1632    PT LONG TERM GOAL #1   Title pt will be able to sit with proper posture x 1 hour without increased hip / back pain to return to work activities    Time 3   Period Weeks   Status New   PT LONG TERM GOAL #2   Title pt will be able to bend to pick up a light item without increased pain   Time 3   Period Weeks   Status New   PT LONG TERM GOAL #3   Title pt will demonstrate proper lifting mechanics to lift a 15lb item from the floor x 5 without pain in the back.    PT LONG TERM GOAL #4   Title pt will be able to walk >1.66m/s x 30 min without increased pain to return to PLOF   Time 3   Period Weeks   Status New               Plan - 08/31/15 1620    Clinical Impression Statement PT modified treatment to accomodate pts c/o L knee pain. pt did have a small lower back spasm during Tball exercises which was relieved with  stretching. pt instructed that some of her former exercises can be done sitting on Tball to better engage her core. pt responded well to treatment today and reported no pain s/p treatment. pt responding well to therapy in general with less pain overall and more localized vs broad  pain in her lower back.    Pt will benefit from skilled therapeutic intervention in order to improve on the following deficits Difficulty walking;Decreased range of motion;Pain;Hypermobility;Hypomobility;Impaired flexibility;Improper body mechanics;Decreased strength;Postural dysfunction   Rehab Potential Good   PT Frequency 2x / week   PT Duration 3 weeks   PT Treatment/Interventions Electrical Stimulation;Cryotherapy;Moist Heat;Therapeutic exercise;Therapeutic activities;Gait training;Traction;Ultrasound;Neuromuscular re-education;Patient/family education;Manual techniques;Taping;Dry needling;Passive range of motion   Consulted and Agree with Plan of Care Patient        Problem List Patient Active Problem List   Diagnosis Date Noted  . Chronic low back pain 11/21/2014  . Osteopenia 11/21/2014  . Routine general medical examination at a health care facility 07/19/2013  . Atrophic vaginitis 07/16/2012  . Allergic rhinitis 07/16/2012   Gorden Harms. [name], PT, DPT [phone]  [name],[name] 08/31/2015, 4:26 PM  [place] MAIN [facility] SERVICES [address] [place], [place], [address] Phone: [phone]   Fax:  [phone]

## 2015-09-02 ENCOUNTER — Ambulatory Visit: Payer: Managed Care, Other (non HMO)

## 2015-09-02 DIAGNOSIS — M545 Low back pain, unspecified: Secondary | ICD-10-CM

## 2015-09-03 NOTE — Patient Instructions (Signed)
[email or web address] Piriformis stretch 30s x 2 (both IR and ER) bilaterally LTR x 20 bilaterally with 5s hold DKTC 10s hold x 5 Modified piriformis stretch in chair for at work  Prone hip flexor stretch 30s x 2 Open book stretch x 10 each side Modified cat / camel (not given)  Pt cant kneel

## 2015-09-03 NOTE — Therapy (Signed)
[place] MAIN [facility] SERVICES [address] [place], [place], [address] Phone: [phone]   Fax:  [phone]  Physical Therapy Treatment  Patient Details  Name: Jill Michael MRN: [id number] Date of Birth: [birth year] Referring Provider:  Starling Manns, MD  Encounter Date: 09/02/2015      PT End of Session - 09/03/15 0902    Visit Number 4   Number of Visits 7   Date for PT Re-Evaluation 09/07/15   PT Start Time 0800   PT Stop Time 0850   PT Time Calculation (min) 50 min   Activity Tolerance Patient tolerated treatment well   Behavior During Therapy [facility] for tasks assessed/performed;Anxious      Past Medical History  Diagnosis Date  . Arthritis   . Chicken pox   . Allergy     hay fever  . UTI (urinary tract infection)   . Low back pain     recurrent  . Arthritis     right knee, followed by Jefm Bryant  . Scoliosis     Past Surgical History  Procedure Laterality Date  . Nasal septum surgery    . Tubal ligation    . Breast biopsy  2004    fibroadenoma right breast  . Vaginal delivery      2  . Partial knee replacement Right 2013    Dr. Jefm Bryant    There were no vitals filed for this visit.  Visit Diagnosis:  Left-sided low back pain without sciatica      Subjective Assessment - 09/03/15 0837    Subjective pt reports overall she feels stronger, more flexible and has back pain less frequent and less intense. pt reports also her lateral hip pain is much better. she has been doing water aerobics class which she has been enjoying without issue as well.    Limitations Standing;Lifting;Sitting;Walking   Patient Stated Goals reduce pain, return to PLOF   Currently in Pain? Yes   Pain Score 1    Pain Location --  lower back     Therex   Piriformis stretch 30s x 2 (both IR and ER) bilaterally LTR x 20 bilaterally with 5s hold DKTC 10s hold x 5 Modified piriformis stretch in chair for at work  Prone hip flexor stretch  30s x 2 Open book stretch x 10 each side Modified cat / camel (not given)  Pt cant kneel Prone press up x10  Pt needs mod cues for proper exercise performance. Pt spoke extensively regarding her progress and symptoms prior to starting therapy  Manual therapy Grade 3 CPA and unilateral glides to lumbar spine L1-sacrum 2 bouts of 20s  Pt cued to relax during manual therapy                               PT Long Term Goals - 08/17/15 1632    PT LONG TERM GOAL #1   Title pt will be able to sit with proper posture x 1 hour without increased hip / back pain to return to work activities    Time 3   Period Weeks   Status New   PT LONG TERM GOAL #2   Title pt will be able to bend to pick up a light item without increased pain   Time 3   Period Weeks   Status New   PT LONG TERM GOAL #3   Title pt will demonstrate proper  lifting mechanics to lift a 15lb item from the floor x 5 without pain in the back.    PT LONG TERM GOAL #4   Title pt will be able to walk >1.33m/s x 30 min without increased pain to return to PLOF   Time 3   Period Weeks   Status New               Plan - 09/03/15 4315    Clinical Impression Statement pt reported feeling more loose in her lower back and hips following therapy. reports she feels she needs this after her exercise. pt did not have increased back pain this session    Pt will benefit from skilled therapeutic intervention in order to improve on the following deficits Difficulty walking;Decreased range of motion;Pain;Hypermobility;Hypomobility;Impaired flexibility;Improper body mechanics;Decreased strength;Postural dysfunction   Rehab Potential Good   PT Frequency 2x / week   PT Duration 3 weeks   PT Treatment/Interventions Electrical Stimulation;Cryotherapy;Moist Heat;Therapeutic exercise;Therapeutic activities;Gait training;Traction;Ultrasound;Neuromuscular re-education;Patient/family education;Manual techniques;Taping;Dry  needling;Passive range of motion   PT Next Visit Plan progress hip strengthening    Consulted and Agree with Plan of Care Patient        Problem List Patient Active Problem List   Diagnosis Date Noted  . Chronic low back pain 11/21/2014  . Osteopenia 11/21/2014  . Routine general medical examination at a health care facility 07/19/2013  . Atrophic vaginitis 07/16/2012  . Allergic rhinitis 07/16/2012   Gorden Harms. Sheridyn Canino, PT, DPT [phone]  Anola Mcgough 09/03/2015, 9:04 AM  [place] MAIN [facility] SERVICES [address] [place], [place], [address] Phone: [phone]   Fax:  [phone]

## 2015-09-07 ENCOUNTER — Ambulatory Visit: Payer: Managed Care, Other (non HMO) | Attending: Orthopaedic Surgery

## 2015-09-07 DIAGNOSIS — M545 Low back pain, unspecified: Secondary | ICD-10-CM

## 2015-09-07 NOTE — Therapy (Signed)
[place] MAIN [facility] SERVICES [place], [place], [address] Phone: [phone]   Fax:  [phone]  Physical Therapy Treatment Physical Therapy Progress Note 08/25/15 to 09/07/15  Patient Details  Name: Jill Michael MRN: [id number] Date of Birth: 1951-03-19 Referring Provider:  Starling Manns, MD  Encounter Date: 09/07/2015      PT End of Session - 09/07/15 1404    Visit Number 5   Number of Visits 7   Date for PT Re-Evaluation 09/07/15   PT Start Time 1115   PT Stop Time 1205   PT Time Calculation (min) 50 min   Activity Tolerance Patient tolerated treatment well   Behavior During Therapy [facility] for tasks assessed/performed;Anxious      Past Medical History  Diagnosis Date  . Arthritis   . Chicken pox   . Allergy     hay fever  . UTI (urinary tract infection)   . Low back pain     recurrent  . Arthritis     right knee, followed by Jefm Bryant  . Scoliosis     Past Surgical History  Procedure Laterality Date  . Nasal septum surgery    . Tubal ligation    . Breast biopsy  2004    fibroadenoma right breast  . Vaginal delivery      2  . Partial knee replacement Right 2013    Dr. Jefm Bryant    There were no vitals filed for this visit.  Visit Diagnosis:  Left-sided low back pain without sciatica - Plan: PT plan of care cert/re-cert      Subjective Assessment - 09/07/15 1356    Subjective pt reports she feels she has "turned the corner" regarding her symptoms and she is happy with her progress. she reports not having lower back or pain around the L sacral area for 1 week.    Limitations Standing;Lifting;Sitting;Walking   Patient Stated Goals reduce pain, return to PLOF   Currently in Pain? No/denies      Manual therapy: PT rechecked pelvic alignment and SIJ (-) LLL test (heel lift removed) (+) figure 4 test (+) tenderness over L SIJ (-) [place] test Pt demonstrates mild R sacral torsion in prone PA mobs  grade 2-3 to L Lateral border of sacrum 2x30s Supine long axis dystraction x 3 min Supine AP mobilization to the ilium grade 2-3 3x20s Side lying L SIJ mobilization with movement into hip flexion with PT placing mobilization into L posterior ilium  rotation x 10   Therex: Pt demo and education of neutral pelvis with visual and tactile cues x 3 min Pt supine at 90/90 hip flexion/knee flexion with gentle pressure into wall and posterior pelvic tilt to initiate TA contraction 2x10 (5s hold)     Ice to L SIJ in prone following treatment( no charge) x 10 min                     PT Education - 09/07/15 1404    Education provided Yes   Education Details neutral spine position   Person(s) Educated Patient   Methods Explanation   Comprehension Verbalized understanding;Returned demonstration             PT Long Term Goals - 09/07/15 1414    PT LONG TERM GOAL #1   Title pt will be able to sit with proper posture x 1 hour without increased hip / back pain to return to work activities  Time 3   Period Weeks   Status Achieved   PT LONG TERM GOAL #2   Title pt will be able to bend to pick up a light item without increased pain   Baseline needed cues for neutral spine   Time 3   Period Weeks   Status Achieved   PT LONG TERM GOAL #3   Title pt will demonstrate proper lifting mechanics to lift a 15lb item from the floor x 5 without pain in the back.    PT LONG TERM GOAL #4   Title pt will be able to walk >1.42m/s x 30 min without increased pain to return to PLOF   Time 3   Period Weeks   Status On-going               Plan - 09/07/15 1405    Clinical Impression Statement pt is making excellent progress towards goals and demonstrates improved strength, reduced pain and improved mobility and flexibility. pt is now able to sit comfortably for 3+ hours which was her primary compliant. pt would benefit from additional 1 PT session to solidify HEP . in todays  session pt responded well to gentle L SIJ mobilization and also demonstrated proper pelvic alignment where her heel lift has thus been removed.    Pt will benefit from skilled therapeutic intervention in order to improve on the following deficits Difficulty walking;Decreased range of motion;Pain;Hypermobility;Hypomobility;Impaired flexibility;Improper body mechanics;Decreased strength;Postural dysfunction   Rehab Potential Good   PT Frequency 2x / week   PT Duration --  1 week   PT Treatment/Interventions Electrical Stimulation;Cryotherapy;Moist Heat;Therapeutic exercise;Therapeutic activities;Gait training;Traction;Ultrasound;Neuromuscular re-education;Patient/family education;Manual techniques;Taping;Dry needling;Passive range of motion   PT Next Visit Plan progress hip strengthening    Consulted and Agree with Plan of Care Patient        Problem List Patient Active Problem List   Diagnosis Date Noted  . Chronic low back pain 11/21/2014  . Osteopenia 11/21/2014  . Routine general medical examination at a health care facility 07/19/2013  . Atrophic vaginitis 07/16/2012  . Allergic rhinitis 07/16/2012   Gorden Harms. Taria Castrillo, PT, DPT [phone]  Bookert Guzzi 09/07/2015, 2:50 PM  [place] MAIN [facility] SERVICES [address] [place], [place], [address] Phone: [phone]   Fax:  [phone]

## 2015-09-09 ENCOUNTER — Ambulatory Visit: Payer: Managed Care, Other (non HMO)

## 2015-09-09 DIAGNOSIS — M545 Low back pain, unspecified: Secondary | ICD-10-CM

## 2015-09-09 NOTE — Therapy (Signed)
[place] MAIN [facility] SERVICES [address] [place], [place], [address] Phone: [phone]   Fax:  [phone]  Physical Therapy Treatment  Patient Details  Name: Jill Michael MRN: [id number] Date of Birth: Apr 28, 1951 Referring Provider:  Starling Manns, MD  Encounter Date: 09/09/2015      PT End of Session - 09/09/15 1358    Visit Number 6   Number of Visits 7   Date for PT Re-Evaluation 09/07/15   PT Start Time 0800   PT Stop Time 0850   PT Time Calculation (min) 50 min   Activity Tolerance Patient tolerated treatment well   Behavior During Therapy [facility] for tasks assessed/performed;Anxious      Past Medical History  Diagnosis Date  . Arthritis   . Chicken pox   . Allergy     hay fever  . UTI (urinary tract infection)   . Low back pain     recurrent  . Arthritis     right knee, followed by Jefm Bryant  . Scoliosis     Past Surgical History  Procedure Laterality Date  . Nasal septum surgery    . Tubal ligation    . Breast biopsy  2004    fibroadenoma right breast  . Vaginal delivery      2  . Partial knee replacement Right 2013    Dr. Jefm Bryant    There were no vitals filed for this visit.  Visit Diagnosis:  Left-sided low back pain without sciatica      Subjective Assessment - 09/09/15 1357    Subjective pt reports she went for a walk at the track and felt no pain. pt reports she is very happy with her progress and is able to manage her mild residual pain on her own with HEP.   Limitations Standing;Lifting;Sitting;Walking   Patient Stated Goals reduce pain, return to PLOF   Currently in Pain? No/denies     Therex: PT reassessed leg length (WNL) PT reassessed pelvic and sacral alignment. Pt has slight R sacral torsion, very mild L SIJ pain may be due to scoliosis Pt performed SLR 2x10 bilaterally Clamshells 2x10 bilaterally Bridge with march 2x10 Standing hip abd/extension 2x10 each  Sit to stand from  elevated surface x 10 Pt required cues for diaphragmatic breathing and neutral spine.  Calf stretch 20s x 2 each side                              PT Education - 09/09/15 1358    Education provided Yes   Education Details neutral spine. LE strengthening    Person(s) Educated Patient   Methods Explanation   Comprehension Returned demonstration;Verbalized understanding             PT Long Term Goals - 09/09/15 1359    PT LONG TERM GOAL #1   Title pt will be able to sit with proper posture x 1 hour without increased hip / back pain to return to work activities    Time 3   Period Weeks   Status Achieved   PT LONG TERM GOAL #2   Title pt will be able to bend to pick up a light item without increased pain   Baseline needed cues for neutral spine   Time 3   Period Weeks   Status Achieved   PT LONG TERM GOAL #3   Title pt will demonstrate proper lifting mechanics to lift  a 15lb item from the floor x 5 without pain in the back.    Status Achieved   PT LONG TERM GOAL #4   Title pt will be able to walk >1.67m/s x 30 min without increased pain to return to PLOF   Time 3   Period Weeks   Status Achieved               Plan - 09/09/15 1358    Clinical Impression Statement pt has achieved PT goals at this time and will be DC from PT to very comprehensive HEP. pt has improved >90% since starting PT. pt should at this time be able to manage her mild residual pain with her HEP including core, hip and postural strengthening as well as flexibility exercises.    Pt will benefit from skilled therapeutic intervention in order to improve on the following deficits Difficulty walking;Decreased range of motion;Pain;Hypermobility;Hypomobility;Impaired flexibility;Improper body mechanics;Decreased strength;Postural dysfunction   Rehab Potential Good   PT Frequency 2x / week   PT Duration --  1 week   PT Treatment/Interventions Electrical Stimulation;Cryotherapy;Moist  Heat;Therapeutic exercise;Therapeutic activities;Gait training;Traction;Ultrasound;Neuromuscular re-education;Patient/family education;Manual techniques;Taping;Dry needling;Passive range of motion   PT Next Visit Plan progress hip strengthening    Consulted and Agree with Plan of Care Patient        Problem List Patient Active Problem List   Diagnosis Date Noted  . Chronic low back pain 11/21/2014  . Osteopenia 11/21/2014  . Routine general medical examination at a health care facility 07/19/2013  . Atrophic vaginitis 07/16/2012  . Allergic rhinitis 07/16/2012   Gorden Harms. Hjalmar Ballengee, PT, DPT [phone]  Satya Buttram 09/09/2015, 2:00 PM  [place] MAIN [facility] SERVICES [address] [place], [place], [address] Phone: [phone]   Fax:  [phone]

## 2015-09-10 ENCOUNTER — Encounter: Payer: Self-pay | Admitting: Internal Medicine

## 2015-09-10 ENCOUNTER — Ambulatory Visit (INDEPENDENT_AMBULATORY_CARE_PROVIDER_SITE_OTHER): Payer: Managed Care, Other (non HMO) | Admitting: Internal Medicine

## 2015-09-10 ENCOUNTER — Other Ambulatory Visit: Payer: Self-pay | Admitting: Internal Medicine

## 2015-09-10 VITALS — BP 105/67 | HR 100 | Temp 97.7°F | Ht 66.0 in | Wt 166.4 lb

## 2015-09-10 DIAGNOSIS — R102 Pelvic and perineal pain: Secondary | ICD-10-CM | POA: Diagnosis not present

## 2015-09-10 DIAGNOSIS — Z124 Encounter for screening for malignant neoplasm of cervix: Secondary | ICD-10-CM | POA: Insufficient documentation

## 2015-09-10 DIAGNOSIS — N950 Postmenopausal bleeding: Secondary | ICD-10-CM | POA: Diagnosis not present

## 2015-09-10 LAB — HM PAP SMEAR: HM PAP: NEGATIVE

## 2015-09-10 NOTE — Addendum Note (Signed)
Addended by: Ronette Deter A on: 09/10/2015 09:57 AM   Modules accepted: Orders

## 2015-09-10 NOTE — Patient Instructions (Signed)
Please call if any recurrent vaginal bleeding.  We will set up a pelvic ultrasound.  Follow up in 4 weeks.

## 2015-09-10 NOTE — Progress Notes (Signed)
Pre visit review using our clinic review tool, if applicable. No additional management support is needed unless otherwise documented below in the visit note. 

## 2015-09-10 NOTE — Assessment & Plan Note (Signed)
PAP sent today. 

## 2015-09-10 NOTE — Addendum Note (Signed)
Addended by: Karlene Einstein D on: 09/10/2015 02:14 PM   Modules accepted: Orders

## 2015-09-10 NOTE — Assessment & Plan Note (Signed)
Mild pelvic pressure noted by pt with recent vaginal bleeding. Will get pelvic US for evaluation.

## 2015-09-10 NOTE — Assessment & Plan Note (Signed)
Very small amount of post menopausal bleeding noted by pt, likely related to abrasion from applicator. Exam normal today. If any recurrent bleeding, we discussed need for endometrial biopsy. Will also get pelvic US given occasional pelvic pressure. Follow up 4 weeks.

## 2015-09-10 NOTE — Progress Notes (Signed)
Subjective:    Patient ID: Jill Michael, female    DOB: [birth year], 64 y.o.   MRN: [id number]  HPI  64YO female presents for acute visit.  Vaginal bleeding - Friday was placing applicator vaginally with estrogen. Saturday morning small about 1cm area of brownish blood. No further blood. Some pelvic pain, pressure on occasion. No urinary symptoms. No GI symptoms.  Wt Readings from Last 3 Encounters:  09/10/15 166 lb 6 oz (75.467 kg)  05/19/15 170 lb 12.8 oz (77.474 kg)  02/20/15 170 lb 6 oz (77.282 kg)   BP Readings from Last 3 Encounters:  09/10/15 105/67  05/19/15 114/72  02/20/15 113/76    Past Medical History  Diagnosis Date  . Arthritis   . Chicken pox   . Allergy     hay fever  . UTI (urinary tract infection)   . Low back pain     recurrent  . Arthritis     right knee, followed by Jefm Bryant  . Scoliosis    Family History  Problem Relation Age of Onset  . Cancer Mother     breast  . Cancer Father     lung   Past Surgical History  Procedure Laterality Date  . Nasal septum surgery    . Tubal ligation    . Breast biopsy  2004    fibroadenoma right breast  . Vaginal delivery      2  . Partial knee replacement Right 2013    Dr. Jefm Bryant   Social History   Social History  . Marital Status: Married    Spouse Name: N/A  . Number of Children: N/A  . Years of Education: N/A   Social History Main Topics  . Smoking status: Never Smoker   . Smokeless tobacco: None  . Alcohol Use: None  . Drug Use: None  . Sexual Activity: Not Asked   Other Topics Concern  . None   Social History Narrative   Lives in [place]. Moved from [place] - regular, healthy   Exercise - none, walked previously    Review of Systems  Constitutional: Negative for fever, chills, appetite change, fatigue and unexpected weight change.  Eyes: Negative for visual disturbance.  Respiratory: Negative for shortness of breath.   Cardiovascular: Negative  for chest pain and leg swelling.  Gastrointestinal: Negative for abdominal pain.  Genitourinary: Positive for vaginal bleeding and pelvic pain. Negative for dysuria, urgency, frequency, flank pain, vaginal discharge, difficulty urinating, genital sores, vaginal pain and dyspareunia.  Skin: Negative for color change and rash.  Hematological: Negative for adenopathy. Does not bruise/bleed easily.  Psychiatric/Behavioral: Negative for dysphoric mood. The patient is not nervous/anxious.        Objective:    BP 105/67 mmHg  Pulse 100  Temp(Src) 97.7 F (36.5 C) (Oral)  Ht 5\' 6"  (1.676 m)  Wt 166 lb 6 oz (75.467 kg)  BMI 26.87 kg/m2  SpO2 99% Physical Exam  Constitutional: She is oriented to person, place, and time. She appears well-developed and well-nourished. No distress.  HENT:  Head: Normocephalic and atraumatic.  Right Ear: External ear normal.  Left Ear: External ear normal.  Nose: Nose normal.  Mouth/Throat: Oropharynx is clear and moist.  Eyes: Conjunctivae are normal. Pupils are equal, round, and reactive to light. Right eye exhibits no discharge. Left eye exhibits no discharge. No scleral icterus.  Neck: Normal range of motion. Neck supple. No tracheal deviation present.  No thyromegaly present.  Pulmonary/Chest: Effort normal. No accessory muscle usage. No tachypnea. She has no decreased breath sounds. She has no rhonchi.  Genitourinary: Vagina normal and uterus normal. No labial fusion. There is no rash, tenderness, lesion or injury on the right labia. There is no rash, tenderness, lesion or injury on the left labia. Cervix exhibits no motion tenderness, no discharge and no friability. Right adnexum displays no mass, no tenderness and no fullness. Left adnexum displays no mass, no tenderness and no fullness.  Musculoskeletal: Normal range of motion. She exhibits no edema or tenderness.  Lymphadenopathy:    She has no cervical adenopathy.  Neurological: She is alert and  oriented to person, place, and time. No cranial nerve deficit. She exhibits normal muscle tone. Coordination normal.  Skin: Skin is warm and dry. No rash noted. She is not diaphoretic. No erythema. No pallor.  Psychiatric: She has a normal mood and affect. Her behavior is normal. Judgment and thought content normal.          Assessment & Plan:   Problem List Items Addressed This Visit      Unprioritized   Pelvic pain in female - Primary    Mild pelvic pressure noted by pt with recent vaginal bleeding. Will get pelvic US for evaluation.      Relevant Orders   US Transvaginal Non-OB   Postmenopausal bleeding    Very small amount of post menopausal bleeding noted by pt, likely related to abrasion from applicator. Exam normal today. If any recurrent bleeding, we discussed need for endometrial biopsy. Will also get pelvic US given occasional pelvic pressure. Follow up 4 weeks.      Relevant Orders   US Transvaginal Non-OB   Screening for cervical cancer    PAP sent today.          Return in about 4 weeks (around 10/08/2015) for Recheck.

## 2015-09-14 ENCOUNTER — Ambulatory Visit: Payer: Managed Care, Other (non HMO)

## 2015-09-16 LAB — PAP LB, HPV-H+LR
HPV DNA High Risk: NEGATIVE
HPV DNA Low Risk: NEGATIVE
PAP Smear Comment: 0

## 2015-09-18 ENCOUNTER — Ambulatory Visit: Payer: Managed Care, Other (non HMO)

## 2015-09-25 ENCOUNTER — Ambulatory Visit: Payer: Managed Care, Other (non HMO)

## 2015-10-12 ENCOUNTER — Ambulatory Visit: Payer: Managed Care, Other (non HMO) | Admitting: Internal Medicine

## 2015-11-23 ENCOUNTER — Ambulatory Visit (INDEPENDENT_AMBULATORY_CARE_PROVIDER_SITE_OTHER): Payer: Managed Care, Other (non HMO) | Admitting: Internal Medicine

## 2015-11-23 ENCOUNTER — Encounter: Payer: Self-pay | Admitting: Internal Medicine

## 2015-11-23 VITALS — BP 110/76 | HR 83 | Temp 98.4°F | Resp 18 | Ht 65.0 in | Wt 166.1 lb

## 2015-11-23 DIAGNOSIS — J309 Allergic rhinitis, unspecified: Secondary | ICD-10-CM

## 2015-11-23 DIAGNOSIS — Z1239 Encounter for other screening for malignant neoplasm of breast: Secondary | ICD-10-CM | POA: Diagnosis not present

## 2015-11-23 DIAGNOSIS — Z0000 Encounter for general adult medical examination without abnormal findings: Secondary | ICD-10-CM

## 2015-11-23 MED ORDER — ESTROGENS, CONJUGATED 0.625 MG/GM VA CREA: 1.0000 | TOPICAL_CREAM | VAGINAL | Status: DC

## 2015-11-23 MED ORDER — CYCLOSPORINE 0.05 % OP EMUL: 1.0000 [drp] | Freq: Two times a day (BID) | OPHTHALMIC | Status: DC

## 2015-11-23 MED ORDER — FLUTICASONE PROPIONATE 50 MCG/ACT NA SUSP
2.0000 | Freq: Every day | NASAL | Status: DC
Start: 2015-11-23 — End: 2016-12-19

## 2015-11-23 NOTE — Progress Notes (Signed)
Pre-visit discussion using our clinic review tool. No additional management support is needed unless otherwise documented below in the visit note.  

## 2015-11-23 NOTE — Assessment & Plan Note (Signed)
General medical exam normal today including breast exam. PAP was completed in 09/2015 and was normal, HPV neg. Mammogram ordered. Immunizations UTD. Labs ordered through [facility]. Encouraged healthy diet and exercise.

## 2015-11-23 NOTE — Progress Notes (Signed)
Subjective:    Patient ID: Jill Michael, female    DOB: 08-05-1951, 64 y.o.   MRN: [id number]  HPI 64YO female presents for physical exam.  Last seen in 09/2015 for postmenopausal bleeding. Symptoms thought secondary to abrasion from use of applicator. However pelvic US was ordered. Decided not to pursue pelvic US. No further vaginal bleeding noted.  PAP 09/2015 was normal. And HPV neg.  Started water aerobics to help with back pain. Exercising 4-5 days per week.    Wt Readings from Last 3 Encounters:  11/23/15 166 lb 2 oz (75.354 kg)  09/10/15 166 lb 6 oz (75.467 kg)  05/19/15 170 lb 12.8 oz (77.474 kg)   BP Readings from Last 3 Encounters:  11/23/15 110/76  09/10/15 105/67  05/19/15 114/72    Past Medical History  Diagnosis Date  . Arthritis   . Chicken pox   . Allergy     hay fever  . UTI (urinary tract infection)   . Low back pain     recurrent  . Arthritis     right knee, followed by Jefm Bryant  . Scoliosis    Family History  Problem Relation Age of Onset  . Cancer Mother     breast  . Cancer Father     lung   Past Surgical History  Procedure Laterality Date  . Nasal septum surgery    . Tubal ligation    . Breast biopsy  2004    fibroadenoma right breast  . Vaginal delivery      2  . Partial knee replacement Right 2013    Dr. Jefm Bryant   Social History   Social History  . Marital Status: Married    Spouse Name: N/A  . Number of Children: N/A  . Years of Education: N/A   Social History Main Topics  . Smoking status: Never Smoker   . Smokeless tobacco: None  . Alcohol Use: None  . Drug Use: None  . Sexual Activity: Not Asked   Other Topics Concern  . None   Social History Narrative   Lives in [place]. Moved from [place] - regular, healthy   Exercise - none, walked previously    Review of Systems  Constitutional: Negative for fever, chills, appetite change, fatigue and unexpected weight change.    Eyes: Negative for visual disturbance.  Respiratory: Negative for shortness of breath.   Cardiovascular: Negative for chest pain and leg swelling.  Gastrointestinal: Negative for nausea, vomiting, abdominal pain, diarrhea, constipation and abdominal distention.  Genitourinary: Negative for dysuria, frequency, hematuria, vaginal bleeding and pelvic pain.  Musculoskeletal: Negative for myalgias and arthralgias.  Skin: Negative for color change and rash.  Hematological: Negative for adenopathy. Does not bruise/bleed easily.  Psychiatric/Behavioral: Negative for sleep disturbance and dysphoric mood. The patient is not nervous/anxious.        Objective:    BP 110/76 mmHg  Pulse 83  Temp(Src) 98.4 F (36.9 C) (Oral)  Resp 18  Ht 5\' 5"  (1.651 m)  Wt 166 lb 2 oz (75.354 kg)  BMI 27.64 kg/m2  SpO2 100% Physical Exam  Constitutional: She is oriented to person, place, and time. She appears well-developed and well-nourished. No distress.  HENT:  Head: Normocephalic and atraumatic.  Right Ear: External ear normal.  Left Ear: External ear normal.  Nose: Nose normal.  Mouth/Throat: Oropharynx is clear and moist. No oropharyngeal exudate.  Eyes: Conjunctivae are normal. Pupils are  equal, round, and reactive to light. Right eye exhibits no discharge. Left eye exhibits no discharge. No scleral icterus.  Neck: Normal range of motion. Neck supple. No tracheal deviation present. No thyromegaly present.  Cardiovascular: Normal rate, regular rhythm, normal heart sounds and intact distal pulses.  Exam reveals no gallop and no friction rub.   No murmur heard. Pulmonary/Chest: Effort normal and breath sounds normal. No accessory muscle usage. No tachypnea. No respiratory distress. She has no decreased breath sounds. She has no wheezes. She has no rales. She exhibits no tenderness. Right breast exhibits no inverted nipple, no mass, no nipple discharge, no skin change and no tenderness. Left breast exhibits  no inverted nipple, no mass, no nipple discharge, no skin change and no tenderness. Breasts are symmetrical.  Abdominal: Soft. Bowel sounds are normal. She exhibits no distension and no mass. There is no tenderness. There is no rebound and no guarding.  Musculoskeletal: Normal range of motion. She exhibits no edema or tenderness.  Lymphadenopathy:    She has no cervical adenopathy.  Neurological: She is alert and oriented to person, place, and time. No cranial nerve deficit. She exhibits normal muscle tone. Coordination normal.  Skin: Skin is warm and dry. No rash noted. She is not diaphoretic. No erythema. No pallor.  Psychiatric: She has a normal mood and affect. Her behavior is normal. Judgment and thought content normal.          Assessment & Plan:   Problem List Items Addressed This Visit      Unprioritized   Allergic rhinitis   Relevant Medications   fluticasone (FLONASE) 50 MCG/ACT nasal spray   Routine general medical examination at a health care facility - Primary    General medical exam normal today including breast exam. PAP was completed in 09/2015 and was normal, HPV neg. Mammogram ordered. Immunizations UTD. Labs ordered through [facility]. Encouraged healthy diet and exercise.       Other Visit Diagnoses    Screening breast examination        Relevant Orders    MM DIGITAL SCREENING BILATERAL        Return in about 1 year (around 11/22/2016) for Physical.

## 2015-11-23 NOTE — Patient Instructions (Signed)
Health Maintenance, Female Adopting a healthy lifestyle and getting preventive care can go a long way to promote health and wellness. Talk with your health care provider about what schedule of regular examinations is right for you. This is a good chance for you to check in with your provider about disease prevention and staying healthy. In between checkups, there are plenty of things you can do on your own. Experts have done a lot of research about which lifestyle changes and preventive measures are most likely to keep you healthy. Ask your health care provider for more information. WEIGHT AND DIET  Eat a healthy diet  Be sure to include plenty of vegetables, fruits, low-fat dairy products, and lean protein.  Do not eat a lot of foods high in solid fats, added sugars, or salt.  Get regular exercise. This is one of the most important things you can do for your health.  Most adults should exercise for at least 150 minutes each week. The exercise should increase your heart rate and make you sweat (moderate-intensity exercise).  Most adults should also do strengthening exercises at least twice a week. This is in addition to the moderate-intensity exercise.  Maintain a healthy weight  Body mass index (BMI) is a measurement that can be used to identify possible weight problems. It estimates body fat based on height and weight. Your health care provider can help determine your BMI and help you achieve or maintain a healthy weight.  For females 20 years of age and older:   A BMI below 18.5 is considered underweight.  A BMI of 18.5 to 24.9 is normal.  A BMI of 25 to 29.9 is considered overweight.  A BMI of 30 and above is considered obese.  Watch levels of cholesterol and blood lipids  You should start having your blood tested for lipids and cholesterol at 63 years of age, then have this test every 5 years.  You may need to have your cholesterol levels checked more often if:  Your lipid  or cholesterol levels are high.  You are older than 63 years of age.  You are at high risk for heart disease.  CANCER SCREENING   Lung Cancer  Lung cancer screening is recommended for adults 55-80 years old who are at high risk for lung cancer because of a history of smoking.  A yearly low-dose CT scan of the lungs is recommended for people who:  Currently smoke.  Have quit within the past 15 years.  Have at least a 30-pack-year history of smoking. A pack year is smoking an average of one pack of cigarettes a day for 1 year.  Yearly screening should continue until it has been 15 years since you quit.  Yearly screening should stop if you develop a health problem that would prevent you from having lung cancer treatment.  Breast Cancer  Practice breast self-awareness. This means understanding how your breasts normally appear and feel.  It also means doing regular breast self-exams. Let your health care provider know about any changes, no matter how small.  If you are in your 20s or 30s, you should have a clinical breast exam (CBE) by a health care provider every 1-3 years as part of a regular health exam.  If you are 40 or older, have a CBE every year. Also consider having a breast X-ray (mammogram) every year.  If you have a family history of breast cancer, talk to your health care provider about genetic screening.  If you   are at high risk for breast cancer, talk to your health care provider about having an MRI and a mammogram every year.  Breast cancer gene (BRCA) assessment is recommended for women who have family members with BRCA-related cancers. BRCA-related cancers include:  Breast.  Ovarian.  Tubal.  Peritoneal cancers.  Results of the assessment will determine the need for genetic counseling and BRCA1 and BRCA2 testing. Cervical Cancer Your health care provider may recommend that you be screened regularly for cancer of the pelvic organs (ovaries, uterus, and  vagina). This screening involves a pelvic examination, including checking for microscopic changes to the surface of your cervix (Pap test). You may be encouraged to have this screening done every 3 years, beginning at age 21.  For women ages 30-65, health care providers may recommend pelvic exams and Pap testing every 3 years, or they may recommend the Pap and pelvic exam, combined with testing for human papilloma virus (HPV), every 5 years. Some types of HPV increase your risk of cervical cancer. Testing for HPV may also be done on women of any age with unclear Pap test results.  Other health care providers may not recommend any screening for nonpregnant women who are considered low risk for pelvic cancer and who do not have symptoms. Ask your health care provider if a screening pelvic exam is right for you.  If you have had past treatment for cervical cancer or a condition that could lead to cancer, you need Pap tests and screening for cancer for at least 20 years after your treatment. If Pap tests have been discontinued, your risk factors (such as having a new sexual partner) need to be reassessed to determine if screening should resume. Some women have medical problems that increase the chance of getting cervical cancer. In these cases, your health care provider may recommend more frequent screening and Pap tests. Colorectal Cancer  This type of cancer can be detected and often prevented.  Routine colorectal cancer screening usually begins at 63 years of age and continues through 63 years of age.  Your health care provider may recommend screening at an earlier age if you have risk factors for colon cancer.  Your health care provider may also recommend using home test kits to check for hidden blood in the stool.  A small camera at the end of a tube can be used to examine your colon directly (sigmoidoscopy or colonoscopy). This is done to check for the earliest forms of colorectal  cancer.  Routine screening usually begins at age 50.  Direct examination of the colon should be repeated every 5-10 years through 63 years of age. However, you may need to be screened more often if early forms of precancerous polyps or small growths are found. Skin Cancer  Check your skin from head to toe regularly.  Tell your health care provider about any new moles or changes in moles, especially if there is a change in a mole's shape or color.  Also tell your health care provider if you have a mole that is larger than the size of a pencil eraser.  Always use sunscreen. Apply sunscreen liberally and repeatedly throughout the day.  Protect yourself by wearing long sleeves, pants, a wide-brimmed hat, and sunglasses whenever you are outside. HEART DISEASE, DIABETES, AND HIGH BLOOD PRESSURE   High blood pressure causes heart disease and increases the risk of stroke. High blood pressure is more likely to develop in:  People who have blood pressure in the high end   of the normal range (130-139/85-89 mm Hg).  People who are overweight or obese.  People who are African American.  If you are 38-23 years of age, have your blood pressure checked every 3-5 years. If you are 61 years of age or older, have your blood pressure checked every year. You should have your blood pressure measured twice--once when you are at a hospital or clinic, and once when you are not at a hospital or clinic. Record the average of the two measurements. To check your blood pressure when you are not at a hospital or clinic, you can use:  An automated blood pressure machine at a pharmacy.  A home blood pressure monitor.  If you are between 45 years and 39 years old, ask your health care provider if you should take aspirin to prevent strokes.  Have regular diabetes screenings. This involves taking a blood sample to check your fasting blood sugar level.  If you are at a normal weight and have a low risk for diabetes,  have this test once every three years after 63 years of age.  If you are overweight and have a high risk for diabetes, consider being tested at a younger age or more often. PREVENTING INFECTION  Hepatitis B  If you have a higher risk for hepatitis B, you should be screened for this virus. You are considered at high risk for hepatitis B if:  You were born in a country where hepatitis B is common. Ask your health care provider which countries are considered high risk.  Your parents were born in a high-risk country, and you have not been immunized against hepatitis B (hepatitis B vaccine).  You have HIV or AIDS.  You use needles to inject street drugs.  You live with someone who has hepatitis B.  You have had sex with someone who has hepatitis B.  You get hemodialysis treatment.  You take certain medicines for conditions, including cancer, organ transplantation, and autoimmune conditions. Hepatitis C  Blood testing is recommended for:  Everyone born from [birth year] through 1964.  Anyone with known risk factors for hepatitis C. Sexually transmitted infections (STIs)  You should be screened for sexually transmitted infections (STIs) including gonorrhea and chlamydia if:  You are sexually active and are younger than 63 years of age.  You are older than 63 years of age and your health care provider tells you that you are at risk for this type of infection.  Your sexual activity has changed since you were last screened and you are at an increased risk for chlamydia or gonorrhea. Ask your health care provider if you are at risk.  If you do not have HIV, but are at risk, it may be recommended that you take a prescription medicine daily to prevent HIV infection. This is called pre-exposure prophylaxis (PrEP). You are considered at risk if:  You are sexually active and do not regularly use condoms or know the HIV status of your partner(s).  You take drugs by injection.  You are sexually  active with a partner who has HIV. Talk with your health care provider about whether you are at high risk of being infected with HIV. If you choose to begin PrEP, you should first be tested for HIV. You should then be tested every 3 months for as long as you are taking PrEP.  PREGNANCY   If you are premenopausal and you may become pregnant, ask your health care provider about preconception counseling.  If you may  become pregnant, take 400 to 800 micrograms (mcg) of folic acid every day.  If you want to prevent pregnancy, talk to your health care provider about birth control (contraception). OSTEOPOROSIS AND MENOPAUSE   Osteoporosis is a disease in which the bones lose minerals and strength with aging. This can result in serious bone fractures. Your risk for osteoporosis can be identified using a bone density scan.  If you are 61 years of age or older, or if you are at risk for osteoporosis and fractures, ask your health care provider if you should be screened.  Ask your health care provider whether you should take a calcium or vitamin D supplement to lower your risk for osteoporosis.  Menopause may have certain physical symptoms and risks.  Hormone replacement therapy may reduce some of these symptoms and risks. Talk to your health care provider about whether hormone replacement therapy is right for you.  HOME CARE INSTRUCTIONS   Schedule regular health, dental, and eye exams.  Stay current with your immunizations.   Do not use any tobacco products including cigarettes, chewing tobacco, or electronic cigarettes.  If you are pregnant, do not drink alcohol.  If you are breastfeeding, limit how much and how often you drink alcohol.  Limit alcohol intake to no more than 1 drink per day for nonpregnant women. One drink equals 12 ounces of beer, 5 ounces of wine, or 1 ounces of hard liquor.  Do not use street drugs.  Do not share needles.  Ask your health care provider for help if  you need support or information about quitting drugs.  Tell your health care provider if you often feel depressed.  Tell your health care provider if you have ever been abused or do not feel safe at home.   This information is not intended to replace advice given to you by your health care provider. Make sure you discuss any questions you have with your health care provider.   Document Released: 06/06/2011 Document Revised: 12/12/2014 Document Reviewed: 10/23/2013 Elsevier Interactive Patient Education [employer].

## 2015-12-03 ENCOUNTER — Encounter: Payer: Self-pay | Admitting: Internal Medicine

## 2015-12-16 ENCOUNTER — Encounter: Payer: Self-pay | Admitting: Nurse Practitioner

## 2015-12-16 ENCOUNTER — Ambulatory Visit (INDEPENDENT_AMBULATORY_CARE_PROVIDER_SITE_OTHER): Payer: Managed Care, Other (non HMO) | Admitting: Nurse Practitioner

## 2015-12-16 VITALS — BP 102/66 | HR 111 | Temp 99.5°F | Resp 16 | Ht 65.0 in | Wt 165.8 lb

## 2015-12-16 DIAGNOSIS — B9689 Other specified bacterial agents as the cause of diseases classified elsewhere: Secondary | ICD-10-CM | POA: Insufficient documentation

## 2015-12-16 DIAGNOSIS — J019 Acute sinusitis, unspecified: Secondary | ICD-10-CM

## 2015-12-16 MED ORDER — DOXYCYCLINE HYCLATE 100 MG PO TABS: 100.0000 mg | ORAL_TABLET | Freq: Two times a day (BID) | ORAL | Status: DC

## 2015-12-16 NOTE — Assessment & Plan Note (Signed)
New onset  Continue OTC measures Doxycyline 100 mg twice daily x 7 days Encouraged probiotics  Benadryl at night 0.5 to 1 tablet  FU prn worsening/failure to improve.

## 2015-12-16 NOTE — Progress Notes (Signed)
Patient ID: Jill Michael, female    DOB: 02/12/1951  Age: 65 y.o. MRN: [id number]  CC: Sinusitis; Sore Throat; and Fever   HPI Jill Michael presents for CC of sinusitis, ST, and fever x 2 months.   1) x 2 months, but worsened on Monday  Scratchy sore throat on left  Tender below left eyelid Sneezing  Rhinorrhea clear  Tmax- 99.5 Eyes watering  Treatment to date: Eye drops- Soothe   Uses restasis on top of this  Sudafed Mobic Warm compresses  Saline   Sick contacts- co-workers   History Jill Michael has a past medical history of Arthritis; Chicken pox; Allergy; UTI (urinary tract infection); Low back pain; Arthritis; and Scoliosis.   She has past surgical history that includes Nasal septum surgery; Tubal ligation; Breast biopsy (2004); Vaginal delivery; and partial knee replacement (Right, 2013).   Her family history includes Cancer in her father and mother.She reports that she has never smoked. She does not have any smokeless tobacco history on file. Her alcohol and drug histories are not on file.  Outpatient Prescriptions Prior to Visit  Medication Sig Dispense Refill  . baclofen (LIORESAL) 10 MG tablet     . Calcium-Magnesium-Vitamin D (CALCIUM 500 PO) Take by mouth.    . Cholecalciferol (VITAMIN D) 2000 UNITS CAPS Take 1 capsule by mouth daily.    Marland Kitchen conjugated estrogens (PREMARIN) vaginal cream Place 1 Applicatorful vaginally 2 (two) times a week. 127.5 g 3  . cyclobenzaprine (FLEXERIL) 5 MG tablet Take 1 tablet (5 mg total) by mouth 3 (three) times daily as needed for muscle spasms. 90 tablet 3  . cycloSPORINE (RESTASIS) 0.05 % ophthalmic emulsion Place 1 drop into both eyes 2 (two) times daily. 180 each 3  . fluticasone (FLONASE) 50 MCG/ACT nasal spray Place 2 sprays into both nostrils daily. 48 g 3  . Hyaluronic Acid-Vitamin C (HYALURONIC ACID PO) Take 2 capsules by mouth daily.    Marland Kitchen ibuprofen (ADVIL,MOTRIN) 200 MG tablet Take 200 mg by mouth every 6 (six) hours as  needed for pain.    Marland Kitchen loratadine (CLARITIN) 10 MG tablet Take 10 mg by mouth daily.    . meloxicam (MOBIC) 15 MG tablet     . Multiple Vitamins-Minerals (ZINC PO) Take by mouth.    Marland Kitchen OVER THE COUNTER MEDICATION Take 3 capsules by mouth daily. Instaflex    . OVER THE COUNTER MEDICATION Take 1 capsule by mouth 2 (two) times daily. Tumeric     No facility-administered medications prior to visit.    ROS Review of Systems  Constitutional: Negative for fever, chills, diaphoresis and fatigue.  HENT: Positive for congestion, ear pain, facial swelling, postnasal drip, rhinorrhea, sinus pressure, sneezing and sore throat. Negative for trouble swallowing and voice change.   Eyes: Positive for discharge. Negative for pain, redness, itching and visual disturbance.       Watery  Respiratory: Positive for cough. Negative for chest tightness, shortness of breath and wheezing.   Cardiovascular: Negative for chest pain, palpitations and leg swelling.  Gastrointestinal: Negative for nausea, vomiting and diarrhea.  Skin: Negative for rash.  Neurological: Negative for light-headedness and headaches.    Objective:  BP 102/66 mmHg  Pulse 111  Temp(Src) 99.5 F (37.5 C) (Oral)  Resp 16  Ht 5\' 5"  (1.651 m)  Wt 165 lb 12.8 oz (75.206 kg)  BMI 27.59 kg/m2  SpO2 96%  Physical Exam  Constitutional: She is oriented to person, place, and time. She appears well-developed and  well-nourished. No distress.  HENT:  Head: Normocephalic and atraumatic.  Right Ear: External ear normal.  Left Ear: External ear normal.  Mouth/Throat: No oropharyngeal exudate.  TM's clear bilaterally, slight retraction  Eyes: EOM are normal. Pupils are equal, round, and reactive to light. Right eye exhibits no discharge. Left eye exhibits no discharge. No scleral icterus.  Puffiness and tenderness below left eye  Neck: Normal range of motion. Neck supple.  Cardiovascular: Normal rate, regular rhythm and normal heart sounds.  Exam  reveals no gallop and no friction rub.   No murmur heard. Pulmonary/Chest: Effort normal and breath sounds normal. No respiratory distress. She has no wheezes. She has no rales. She exhibits no tenderness.  Lymphadenopathy:    She has no cervical adenopathy.  Neurological: She is alert and oriented to person, place, and time. No cranial nerve deficit. She exhibits normal muscle tone. Coordination normal.  Skin: Skin is warm and dry. No rash noted. She is not diaphoretic.  Psychiatric: She has a normal mood and affect. Her behavior is normal. Judgment and thought content normal.   Assessment & Plan:   Jill Michael was seen today for sinusitis, sore throat and fever.  Diagnoses and all orders for this visit:  Acute bacterial rhinosinusitis  Other orders -     doxycycline (VIBRA-TABS) 100 MG tablet; Take 1 tablet (100 mg total) by mouth 2 (two) times daily.  I am having Jill Michael start on doxycycline. I am also having her maintain her loratadine, Vitamin D, ibuprofen, OVER THE COUNTER MEDICATION, Hyaluronic Acid-Vitamin C (HYALURONIC ACID PO), OVER THE COUNTER MEDICATION, Multiple Vitamins-Minerals (ZINC PO), cyclobenzaprine, meloxicam, baclofen, fluticasone, conjugated estrogens, cycloSPORINE, and Calcium-Magnesium-Vitamin D (CALCIUM 500 PO).  Meds ordered this encounter  Medications  . doxycycline (VIBRA-TABS) 100 MG tablet    Sig: Take 1 tablet (100 mg total) by mouth 2 (two) times daily.    Dispense:  14 tablet    Refill:  0    Order Specific Question:  Supervising Provider    Answer:  Crecencio Mc [[id number]]     Follow-up: Return if symptoms worsen or fail to improve.

## 2015-12-16 NOTE — Patient Instructions (Addendum)
Please take a probiotic ( Align, Floraque or Culturelle) while you are on the antibiotic to prevent a serious antibiotic associated diarrhea  Called clostirudium dificile colitis and a vaginal yeast infection.   Benadryl at night 0.5- 1 tablet at night time.

## 2015-12-18 ENCOUNTER — Ambulatory Visit
Admission: RE | Admit: 2015-12-18 | Discharge: 2015-12-18 | Disposition: A | Discharge status: Home / Self Care | Payer: Managed Care, Other (non HMO) | Source: Ambulatory Visit | Attending: Internal Medicine | Admitting: Internal Medicine

## 2015-12-18 ENCOUNTER — Ambulatory Visit: Payer: Self-pay | Admitting: Nurse Practitioner

## 2015-12-18 DIAGNOSIS — Z1239 Encounter for other screening for malignant neoplasm of breast: Secondary | ICD-10-CM

## 2015-12-18 DIAGNOSIS — Z1231 Encounter for screening mammogram for malignant neoplasm of breast: Secondary | ICD-10-CM | POA: Insufficient documentation

## 2015-12-23 ENCOUNTER — Ambulatory Visit: Payer: Managed Care, Other (non HMO)

## 2016-01-07 ENCOUNTER — Ambulatory Visit (INDEPENDENT_AMBULATORY_CARE_PROVIDER_SITE_OTHER): Payer: Managed Care, Other (non HMO) | Admitting: Nurse Practitioner

## 2016-01-07 ENCOUNTER — Encounter: Payer: Self-pay | Admitting: Nurse Practitioner

## 2016-01-07 VITALS — BP 110/72 | HR 87 | Temp 97.9°F | Ht 65.0 in | Wt 165.2 lb

## 2016-01-07 DIAGNOSIS — J069 Acute upper respiratory infection, unspecified: Secondary | ICD-10-CM | POA: Diagnosis not present

## 2016-01-07 MED ORDER — METHYLPREDNISOLONE 4 MG PO TABS: ORAL_TABLET | ORAL | Status: DC

## 2016-01-07 NOTE — Progress Notes (Signed)
Pre visit review using our clinic review tool, if applicable. No additional management support is needed unless otherwise documented below in the visit note. 

## 2016-01-07 NOTE — Progress Notes (Signed)
Patient ID: Jill Michael, female    DOB: Jul 18, 1951  Age: 65 y.o. MRN: [id number]  CC: Acute Visit   HPI Jill Michael presents for dizziness x Tuesday   1) Dizziness, right ear pain, rhinorrhea- clear  Husband- case of vertigo  Dry scratchy throat  Spinning sensation  Denies sick contacts Treatment to date: Sudafed- 2 tablets  Flonase daily  Claritin   Ordered screening for hepc and hiv for [facility]  History Jill Michael has a past medical history of Arthritis; Chicken pox; Allergy; UTI (urinary tract infection); Low back pain; Arthritis; and Scoliosis.   She has past surgical history that includes Nasal septum surgery; Tubal ligation; Vaginal delivery; partial knee replacement (Right, 2013); and Breast biopsy (Right, 2000 and 2005).   Her family history includes Breast cancer (age of onset: 33) in her mother; Cancer in her father and mother.She reports that she has never smoked. She does not have any smokeless tobacco history on file. Her alcohol and drug histories are not on file.  Outpatient Prescriptions Prior to Visit  Medication Sig Dispense Refill  . baclofen (LIORESAL) 10 MG tablet     . Calcium-Magnesium-Vitamin D (CALCIUM 500 PO) Take by mouth.    . Cholecalciferol (VITAMIN D) 2000 UNITS CAPS Take 1 capsule by mouth daily.    Marland Kitchen conjugated estrogens (PREMARIN) vaginal cream Place 1 Applicatorful vaginally 2 (two) times a week. 127.5 g 3  . cyclobenzaprine (FLEXERIL) 5 MG tablet Take 1 tablet (5 mg total) by mouth 3 (three) times daily as needed for muscle spasms. 90 tablet 3  . cycloSPORINE (RESTASIS) 0.05 % ophthalmic emulsion Place 1 drop into both eyes 2 (two) times daily. 180 each 3  . fluticasone (FLONASE) 50 MCG/ACT nasal spray Place 2 sprays into both nostrils daily. 48 g 3  . Hyaluronic Acid-Vitamin C (HYALURONIC ACID PO) Take 2 capsules by mouth daily.    Marland Kitchen ibuprofen (ADVIL,MOTRIN) 200 MG tablet Take 200 mg by mouth every 6 (six) hours as needed for pain.    Marland Kitchen  loratadine (CLARITIN) 10 MG tablet Take 10 mg by mouth daily.    . meloxicam (MOBIC) 15 MG tablet     . Multiple Vitamins-Minerals (ZINC PO) Take by mouth.    Marland Kitchen OVER THE COUNTER MEDICATION Take 3 capsules by mouth daily. Instaflex    . OVER THE COUNTER MEDICATION Take 1 capsule by mouth 2 (two) times daily. Tumeric    . doxycycline (VIBRA-TABS) 100 MG tablet Take 1 tablet (100 mg total) by mouth 2 (two) times daily. 14 tablet 0   No facility-administered medications prior to visit.    ROS Review of Systems  Constitutional: Negative for fever, chills, diaphoresis and fatigue.  HENT: Positive for congestion, ear pain, postnasal drip, rhinorrhea, sinus pressure and sore throat. Negative for drooling, ear discharge, sneezing, trouble swallowing and voice change.   Eyes: Negative for visual disturbance.  Respiratory: Negative for chest tightness, shortness of breath and wheezing.   Cardiovascular: Negative for chest pain, palpitations and leg swelling.  Gastrointestinal: Negative for nausea, vomiting and diarrhea.  Skin: Negative for rash.  Neurological: Positive for dizziness. Negative for headaches.    Objective:  BP 110/72 mmHg  Pulse 87  Temp(Src) 97.9 F (36.6 C) (Oral)  Ht 5\' 5"  (1.651 m)  Wt 165 lb 4 oz (74.957 kg)  BMI 27.50 kg/m2  SpO2 98%  Physical Exam  Constitutional: She is oriented to person, place, and time. She appears well-developed and well-nourished. No distress.  HENT:  Head: Normocephalic and atraumatic.  Right Ear: External ear normal.  Left Ear: External ear normal.  Mouth/Throat: Oropharynx is clear and moist. No oropharyngeal exudate.  TMs clear bilaterally Could not reproduce dizziness with movement today at exam Boggy turbinates bilaterally  Eyes: EOM are normal. Pupils are equal, round, and reactive to light. Right eye exhibits no discharge. Left eye exhibits no discharge. No scleral icterus.  Neck: Normal range of motion. Neck supple.   Cardiovascular: Normal rate, regular rhythm and normal heart sounds.  Exam reveals no gallop and no friction rub.   No murmur heard. Pulmonary/Chest: Effort normal and breath sounds normal. No respiratory distress. She has no wheezes. She has no rales. She exhibits no tenderness.  Lymphadenopathy:    She has no cervical adenopathy.  Neurological: She is alert and oriented to person, place, and time. No cranial nerve deficit. She exhibits normal muscle tone. Coordination normal.  Skin: Skin is warm and dry. No rash noted. She is not diaphoretic.  Psychiatric: She has a normal mood and affect. Her behavior is normal. Judgment and thought content normal.   Assessment & Plan:   Jill Michael was seen today for acute visit.  Diagnoses and all orders for this visit:  Viral URI  Other orders -     methylPREDNISolone (MEDROL) 4 MG tablet; Take 6 tablets by mouth with breakfast or lunch and decrease by 1 tablet each day until gone.  I have discontinued Jill Michael doxycycline. I am also having her start on methylPREDNISolone. Additionally, I am having her maintain her loratadine, Vitamin D, ibuprofen, OVER THE COUNTER MEDICATION, Hyaluronic Acid-Vitamin C (HYALURONIC ACID PO), OVER THE COUNTER MEDICATION, Multiple Vitamins-Minerals (ZINC PO), cyclobenzaprine, meloxicam, baclofen, fluticasone, conjugated estrogens, cycloSPORINE, and Calcium-Magnesium-Vitamin D (CALCIUM 500 PO).  Meds ordered this encounter  Medications  . methylPREDNISolone (MEDROL) 4 MG tablet    Sig: Take 6 tablets by mouth with breakfast or lunch and decrease by 1 tablet each day until gone.    Dispense:  21 tablet    Refill:  0    Order Specific Question:  Supervising Provider    Answer:  Crecencio Mc [[id number]]     Follow-up: Return if symptoms worsen or fail to improve.

## 2016-01-07 NOTE — Patient Instructions (Signed)
Prednisone with breakfast or lunch at the latest.  6 tablets on day 1, 5 tablets on day 2, 4 tablets on day 3, 3 tablets on day 4, 2 tablets day 5, 1 tablet on day 6...done! Take tablets all together not spaced out Don't take with NSAIDs (Ibuprofen, Aleve, Naproxen, Meloxicam ect...)

## 2016-01-12 DIAGNOSIS — J069 Acute upper respiratory infection, unspecified: Secondary | ICD-10-CM | POA: Insufficient documentation

## 2016-01-12 NOTE — Assessment & Plan Note (Addendum)
New Onset Will treat conservatively due to probable viral nature Prednisone taper to decrease inflammation given  Instructions for taking done verbally and on AVS Flonase patient is using Mucinex plain and benadryl at night encouraged FU prn worsening/failure to improve.

## 2016-01-13 ENCOUNTER — Encounter: Payer: Self-pay | Admitting: Family Medicine

## 2016-01-13 ENCOUNTER — Ambulatory Visit (INDEPENDENT_AMBULATORY_CARE_PROVIDER_SITE_OTHER): Payer: Managed Care, Other (non HMO) | Admitting: Family Medicine

## 2016-01-13 VITALS — BP 114/70 | HR 80 | Temp 97.7°F | Ht 65.0 in | Wt 162.8 lb

## 2016-01-13 DIAGNOSIS — J069 Acute upper respiratory infection, unspecified: Secondary | ICD-10-CM | POA: Diagnosis not present

## 2016-01-13 NOTE — Patient Instructions (Signed)
Nice to meet you. Your symptoms are likely related to a viral illness she had last week. You should continue Flonase and Claritin. If you develop persistent dizziness, or numbness, weakness, vision changes, worsening ear pain, or any new or changing symptoms please let us know. If this persists we may need to consider ear nose and throat evaluation.

## 2016-01-13 NOTE — Progress Notes (Signed)
Pre visit review using our clinic review tool, if applicable. No additional management support is needed unless otherwise documented below in the visit note. 

## 2016-01-13 NOTE — Assessment & Plan Note (Signed)
Patient is status post treatment with methylprednisolone for viral upper respiratory infection. Her ears appear normal today. Symptoms have been improving. Discussed that symptoms are likely related to congestion from viral infection. Advised to continue using Flonase and Claritin. She'll continue to monitor. If this becomes a persistent issue evaluation by ENT may be beneficial. Given return precautions.

## 2016-01-13 NOTE — Progress Notes (Signed)
Patient ID: Jill Michael, female   DOB: 1951-07-20, 65 y.o.   MRN: [id number]  Jill Rumps, MD Phone: [phone]  Jill Michael is a 65 y.o. female who presents today for same-day visit.  Patient reports she was seen a week ago for upper respiratory infection and started on steroids. She notes her symptoms have improved. She still has mild earache and pressure in her right ear. She notes mild maxillary sinus congestion that has improved as well. No postnasal drip, hearing loss, or tinnitus. Notes minimal dizziness if she looks around to quickly, though this is much improved from last week. No lightheadedness. Notes when she was in her 39s she had an issue with fluid draining from her ear inadequately and had to have tubes put in. She wants a recheck of her ears. She completed the course of steroids. She is using Flonase and Claritin.  PMH: nonsmoker.   ROS see history of present illness  Objective  Physical Exam Filed Vitals:   01/13/16 1555  BP: 114/70  Pulse: 80  Temp: 97.7 F (36.5 C)    BP Readings from Last 3 Encounters:  01/13/16 114/70  01/07/16 110/72  12/16/15 102/66   Wt Readings from Last 3 Encounters:  01/13/16 162 lb 12.8 oz (73.846 kg)  01/07/16 165 lb 4 oz (74.957 kg)  12/16/15 165 lb 12.8 oz (75.206 kg)    Physical Exam  Constitutional: She is well-developed, well-nourished, and in no distress.  HENT:  Head: Normocephalic and atraumatic.  Right Ear: External ear normal.  Left Ear: External ear normal.  Mouth/Throat: Oropharynx is clear and moist. No oropharyngeal exudate.  Normal light reflex bilateral tympanic membranes, no fluid behind tympanic membranes, no erythema tympanic membranes, no bulging of tympanic membrane  Eyes: Conjunctivae are normal. Pupils are equal, round, and reactive to light.  Cardiovascular: Normal rate and regular rhythm.  Exam reveals no gallop and no friction rub.   No murmur heard. Pulmonary/Chest: Effort normal  and breath sounds normal.  Lymphadenopathy:    She has no cervical adenopathy.  Neurological: She is alert.  CN 2-12 intact, 5/5 strength in bilateral biceps, triceps, grip, quads, hamstrings, plantar and dorsiflexion, sensation to light touch intact in bilateral UE and LE, normal gait, 2+ patellar reflexes, negative Dix-Hallpike  Skin: Skin is warm and dry. She is not diaphoretic.     Assessment/Plan: Please see individual problem list.  Viral URI Patient is status post treatment with methylprednisolone for viral upper respiratory infection. Her ears appear normal today. Symptoms have been improving. Discussed that symptoms are likely related to congestion from viral infection. Advised to continue using Flonase and Claritin. She'll continue to monitor. If this becomes a persistent issue evaluation by ENT may be beneficial. Given return precautions.    Jill Michael

## 2016-01-14 ENCOUNTER — Ambulatory Visit: Payer: Managed Care, Other (non HMO) | Admitting: Family Medicine

## 2016-04-04 ENCOUNTER — Other Ambulatory Visit: Payer: Self-pay | Admitting: Internal Medicine

## 2016-04-05 LAB — CBC WITH DIFFERENTIAL/PLATELET
Basophils Absolute: 0 10*3/uL (ref 0.0–0.2)
Basos: 1 %
EOS (ABSOLUTE): 0.2 10*3/uL (ref 0.0–0.4)
EOS: 3 %
HEMATOCRIT: 42.7 % (ref 34.0–46.6)
HEMOGLOBIN: 14.3 g/dL (ref 11.1–15.9)
IMMATURE GRANS (ABS): 0 10*3/uL (ref 0.0–0.1)
IMMATURE GRANULOCYTES: 0 %
LYMPHS ABS: 1.6 10*3/uL (ref 0.7–3.1)
Lymphs: 26 %
MCH: 31.1 pg (ref 26.6–33.0)
MCHC: 33.5 g/dL (ref 31.5–35.7)
MCV: 93 fL (ref 79–97)
MONOCYTES: 9 %
MONOS ABS: 0.6 10*3/uL (ref 0.1–0.9)
NEUTROS PCT: 61 %
Neutrophils Absolute: 3.9 10*3/uL (ref 1.4–7.0)
Platelets: 279 10*3/uL (ref 150–379)
RBC: 4.6 x10E6/uL (ref 3.77–5.28)
RDW: 13.3 % (ref 12.3–15.4)
WBC: 6.2 10*3/uL (ref 3.4–10.8)

## 2016-04-05 LAB — COMPREHENSIVE METABOLIC PANEL
ALBUMIN: 4.2 g/dL (ref 3.6–4.8)
ALT: 17 IU/L (ref 0–32)
AST: 20 IU/L (ref 0–40)
Albumin/Globulin Ratio: 1.8 (ref 1.2–2.2)
Alkaline Phosphatase: 43 IU/L (ref 39–117)
BUN / CREAT RATIO: 21 (ref 12–28)
BUN: 15 mg/dL (ref 8–27)
Bilirubin Total: 0.5 mg/dL (ref 0.0–1.2)
CALCIUM: 9.5 mg/dL (ref 8.7–10.3)
CO2: 26 mmol/L (ref 18–29)
CREATININE: 0.72 mg/dL (ref 0.57–1.00)
Chloride: 98 mmol/L (ref 96–106)
GFR calc Af Amer: 103 mL/min/{1.73_m2} (ref >59)
GFR, EST NON AFRICAN AMERICAN: 89 mL/min/{1.73_m2} (ref >59)
GLOBULIN, TOTAL: 2.3 g/dL (ref 1.5–4.5)
Glucose: 95 mg/dL (ref 65–99)
Potassium: 4.3 mmol/L (ref 3.5–5.2)
SODIUM: 138 mmol/L (ref 134–144)
TOTAL PROTEIN: 6.5 g/dL (ref 6.0–8.5)

## 2016-04-05 LAB — VITAMIN D 25 HYDROXY (VIT D DEFICIENCY, FRACTURES): VIT D 25 HYDROXY: 57 ng/mL (ref 30.0–100.0)

## 2016-04-05 LAB — LIPID PANEL W/O CHOL/HDL RATIO
Cholesterol, Total: 173 mg/dL (ref 100–199)
HDL: 59 mg/dL (ref >39)
LDL CALC: 100 mg/dL — AB (ref 0–99)
TRIGLYCERIDES: 68 mg/dL (ref 0–149)
VLDL CHOLESTEROL CAL: 14 mg/dL (ref 5–40)

## 2016-04-05 LAB — HEPATITIS C ANTIBODY (REFLEX): HCV Ab: <0.1 s/co ratio (ref 0.0–0.9)

## 2016-04-05 LAB — HGB A1C W/O EAG: HEMOGLOBIN A1C: 5.4 % (ref 4.8–5.6)

## 2016-04-05 LAB — HIV ANTIBODY (ROUTINE TESTING W REFLEX): HIV SCREEN 4TH GENERATION: NONREACTIVE

## 2016-04-05 LAB — TSH: TSH: 2.11 u[IU]/mL (ref 0.450–4.500)

## 2016-04-05 LAB — MAGNESIUM: Magnesium: 2.2 mg/dL (ref 1.6–2.3)

## 2016-04-05 LAB — HCV COMMENT:

## 2016-06-15 ENCOUNTER — Ambulatory Visit (INDEPENDENT_AMBULATORY_CARE_PROVIDER_SITE_OTHER): Payer: Managed Care, Other (non HMO) | Admitting: Family Medicine

## 2016-06-15 ENCOUNTER — Encounter: Payer: Self-pay | Admitting: Family Medicine

## 2016-06-15 VITALS — BP 116/82 | HR 106 | Temp 98.1°F | Wt 157.1 lb

## 2016-06-15 DIAGNOSIS — J329 Chronic sinusitis, unspecified: Secondary | ICD-10-CM | POA: Insufficient documentation

## 2016-06-15 DIAGNOSIS — J019 Acute sinusitis, unspecified: Secondary | ICD-10-CM

## 2016-06-15 MED ORDER — DOXYCYCLINE HYCLATE 100 MG PO TABS: 100.0000 mg | ORAL_TABLET | Freq: Two times a day (BID) | ORAL | Status: DC

## 2016-06-15 NOTE — Progress Notes (Signed)
Subjective:  Patient ID: Jill Michael, female    DOB: 1951-07-27  Age: 66 y.o. MRN: [id number]  CC: ? Fluid behind the ear  HPI:  65 year old female presents with the above complaint.  Patient states that for the past 6 weeks she has felt like there is fluid behind her right ear. She has had trouble with her right ear previously as she's had tympanostomy tubes. She states that the ear feels stuffy and congested. She reports associated dizziness. Additionally, for the past 2 weeks she's had sinus pressure and headache. Sinus pressure and headache located above the left eye. She's also had some rhinorrhea. She's been taking Sudafed and using Flonase and over-the-counter antihistamine without resolution of her symptoms. No associated fevers or chills. No known exacerbating factors. No other complaints.  Social Hx   Social History   Social History  . Marital Status: Married    Spouse Name: N/A  . Number of Children: N/A  . Years of Education: N/A   Social History Main Topics  . Smoking status: Never Smoker   . Smokeless tobacco: None  . Alcohol Use: None  . Drug Use: None  . Sexual Activity: Not Asked   Other Topics Concern  . None   Social History Narrative   Lives in [place]. Moved from [place] - regular, healthy   Exercise - none, walked previously   Review of Systems  Constitutional: Negative for fever.  HENT: Positive for congestion, ear pain, rhinorrhea and sinus pressure.    Objective:  BP 116/82 mmHg  Pulse 106  Temp(Src) 98.1 F (36.7 C) (Oral)  Wt 157 lb 2 oz (71.271 kg)  SpO2 95%  BP/Weight 06/15/2016 [id number]  Systolic BP 99991111 [id number]  Diastolic BP 82 70 72  Wt. (Lbs) 157.13 162.8 165.25  BMI 26.15 27.09 27.5   Physical Exam  Constitutional: She is oriented to person, place, and time. She appears well-developed. No distress.  HENT:  Head: Normocephalic and atraumatic.  Mouth/Throat: Oropharynx is clear and  moist.  Right TM with evidence of scarring. No effusion or erythema noted. Mild maxillary sinus tenderness to palpation.  Eyes: Conjunctivae are normal.  Cardiovascular: Normal rate and regular rhythm.   Pulmonary/Chest: Effort normal. She has no wheezes. She has no rales.  Neurological: She is alert and oriented to person, place, and time.  Psychiatric: She has a normal mood and affect.  Vitals reviewed.  Lab Results  Component Value Date   WBC 6.2 04/04/2016   HGB 14.2 01/23/2015   HCT 42.7 04/04/2016   PLT 279 04/04/2016   GLUCOSE 95 04/04/2016   CHOL 173 04/04/2016   TRIG 68 04/04/2016   HDL 59 04/04/2016   LDLCALC 100* 04/04/2016   ALT 17 04/04/2016   AST 20 04/04/2016   NA 138 04/04/2016   K 4.3 04/04/2016   CL 98 04/04/2016   CREATININE 0.72 04/04/2016   BUN 15 04/04/2016   CO2 26 04/04/2016   TSH 2.110 04/04/2016   HGBA1C 5.4 04/04/2016   Assessment & Plan:   Problem List Items Addressed This Visit    Sinusitis - Primary    New acute problem. Given duration of symptoms, treating empirically with doxycycline (has PCN allergy).      Relevant Medications   doxycycline (VIBRA-TABS) 100 MG tablet      Meds ordered this encounter  Medications  . doxycycline (VIBRA-TABS) 100 MG tablet    Sig:  Take 1 tablet (100 mg total) by mouth 2 (two) times daily.    Dispense:  20 tablet    Refill:  0   Follow-up: PRN  [place]

## 2016-06-15 NOTE — Assessment & Plan Note (Signed)
New acute problem. Given duration of symptoms, treating empirically with doxycycline (has PCN allergy).

## 2016-06-15 NOTE — Patient Instructions (Signed)
Take the antibiotic as prescribed.  Follow up to establish with your new provider.  Take care  Dr. Lacinda Axon

## 2016-06-24 ENCOUNTER — Other Ambulatory Visit: Payer: Self-pay | Admitting: Orthopaedic Surgery

## 2016-06-24 DIAGNOSIS — G894 Chronic pain syndrome: Secondary | ICD-10-CM

## 2016-07-03 ENCOUNTER — Ambulatory Visit
Admission: RE | Admit: 2016-07-03 | Discharge: 2016-07-03 | Disposition: A | Discharge status: Home / Self Care | Payer: Managed Care, Other (non HMO) | Source: Ambulatory Visit | Attending: Orthopaedic Surgery | Admitting: Orthopaedic Surgery

## 2016-07-03 DIAGNOSIS — G894 Chronic pain syndrome: Secondary | ICD-10-CM

## 2016-07-13 ENCOUNTER — Telehealth: Payer: Self-pay | Admitting: *Deleted

## 2016-07-13 NOTE — Telephone Encounter (Signed)
FYI: Dr Rennis Harding at [facility] in [facility] ordered MR Cervical Spine WO Contrast done on 07/03/16.  Results indicated Right Thyroid lesion.  Patient will be added to your schedule to see you tomorrow.

## 2016-07-13 NOTE — Telephone Encounter (Signed)
Noted  

## 2016-07-13 NOTE — Telephone Encounter (Signed)
Patient questioned if she should come in to she her PCP, her MRI showed a lesion on her thyroid. She was advised to be referred to the endocrinologist.  pt contact [phone]

## 2016-07-14 ENCOUNTER — Ambulatory Visit (INDEPENDENT_AMBULATORY_CARE_PROVIDER_SITE_OTHER): Payer: Managed Care, Other (non HMO) | Admitting: Family

## 2016-07-14 ENCOUNTER — Encounter: Payer: Self-pay | Admitting: Family

## 2016-07-14 VITALS — BP 156/76 | HR 110 | Wt 157.8 lb

## 2016-07-14 DIAGNOSIS — E041 Nontoxic single thyroid nodule: Secondary | ICD-10-CM | POA: Diagnosis not present

## 2016-07-14 NOTE — Progress Notes (Signed)
Pre visit review using our clinic review tool, if applicable. No additional management support is needed unless otherwise documented below in the visit note. 

## 2016-07-14 NOTE — Patient Instructions (Signed)
Pleasure meeting you.  Thinking of you.   Joycelyn Schmid

## 2016-07-14 NOTE — Assessment & Plan Note (Addendum)
Cervical MRI shows right thyroid nodule. I have ordered ultrasound of thyroid an. I went ahead and placed a referral to endocrine to expedite the process after ultrasound has been done. Patient is not having any concurrent dysphagia, unintentional weight loss, or palpitations. Note, patient discussed elevation of blood pressure we jointly agreed is most likely due to patient's anxiety about test results. No chest pain, headache. We will follow.

## 2016-07-14 NOTE — Progress Notes (Signed)
Subjective:    Patient ID: Jill Michael, female    DOB: 1950/12/21, 65 y.o.   MRN: [id number]  CC: Jill Michael is a 65 y.o. female who presents today for follow up.   HPI: Here to follow up on results of MRI showing thyroid lesion.   No dysphagia, hoarseness. No unintenional weight loss; although notes she has been trying to loose weight due to LBP. No heat/cold intolerance or palpitations.     HISTORY:  Past Medical History:  Diagnosis Date  . Allergy    hay fever  . Arthritis   . Arthritis    right knee, followed by Jefm Bryant  . Chicken pox   . Low back pain    recurrent  . Scoliosis   . UTI (urinary tract infection)    Past Surgical History:  Procedure Laterality Date  . BREAST BIOPSY Right 2000 and 2005   fibroadenoma right breast x2  . NASAL SEPTUM SURGERY    . partial knee replacement Right 2013   Dr. Jefm Bryant  . TUBAL LIGATION    . VAGINAL DELIVERY     2   Family History  Problem Relation Age of Onset  . Cancer Mother     breast  . Breast cancer Mother 39  . Cancer Father     lung    Allergies: Penicillin v potassium and Penicillins Current Outpatient Prescriptions on File Prior to Visit  Medication Sig Dispense Refill  . baclofen (LIORESAL) 10 MG tablet     . Calcium-Magnesium-Vitamin D (CALCIUM 500 PO) Take by mouth.    . Cholecalciferol (VITAMIN D) 2000 UNITS CAPS Take 1 capsule by mouth daily.    Marland Kitchen conjugated estrogens (PREMARIN) vaginal cream Place 1 Applicatorful vaginally 2 (two) times a week. 127.5 g 3  . cyclobenzaprine (FLEXERIL) 5 MG tablet Take 1 tablet (5 mg total) by mouth 3 (three) times daily as needed for muscle spasms. 90 tablet 3  . cycloSPORINE (RESTASIS) 0.05 % ophthalmic emulsion Place 1 drop into both eyes 2 (two) times daily. 180 each 3  . doxycycline (VIBRA-TABS) 100 MG tablet Take 1 tablet (100 mg total) by mouth 2 (two) times daily. 20 tablet 0  . fluticasone (FLONASE) 50 MCG/ACT nasal spray Place 2 sprays into both  nostrils daily. 48 g 3  . Hyaluronic Acid-Vitamin C (HYALURONIC ACID PO) Take 2 capsules by mouth daily.    Marland Kitchen ibuprofen (ADVIL,MOTRIN) 200 MG tablet Take 200 mg by mouth every 6 (six) hours as needed for pain.    Marland Kitchen loratadine (CLARITIN) 10 MG tablet Take 10 mg by mouth daily.    . meloxicam (MOBIC) 15 MG tablet     . methylPREDNISolone (MEDROL) 4 MG tablet Take 6 tablets by mouth with breakfast or lunch and decrease by 1 tablet each day until gone. 21 tablet 0  . Multiple Vitamins-Minerals (ZINC PO) Take by mouth.    Marland Kitchen OVER THE COUNTER MEDICATION Take 3 capsules by mouth daily. Instaflex    . OVER THE COUNTER MEDICATION Take 1 capsule by mouth 2 (two) times daily. Tumeric     No current facility-administered medications on file prior to visit.     Social History  Substance Use Topics  . Smoking status: Never Smoker  . Smokeless tobacco: Never Used  . Alcohol use Not on file    Review of Systems  Constitutional: Negative for chills and fever.  Respiratory: Negative for cough and shortness of breath.   Cardiovascular: Negative for chest pain  and palpitations.  Gastrointestinal: Negative for nausea and vomiting.  Endocrine: Negative for cold intolerance and heat intolerance.  Neurological: Negative for headaches.      Objective:    BP (!) 156/76 (BP Location: Left Arm, Patient Position: Sitting, Cuff Size: Large)   Pulse (!) 110   Wt 157 lb 12.8 oz (71.6 kg)   SpO2 97%   BMI 26.26 kg/m  BP Readings from Last 3 Encounters:  07/14/16 (!) 156/76  06/15/16 116/82  01/13/16 114/70   Wt Readings from Last 3 Encounters:  07/14/16 157 lb 12.8 oz (71.6 kg)  06/15/16 157 lb 2 oz (71.3 kg)  01/13/16 162 lb 12.8 oz (73.8 kg)    Physical Exam  Constitutional: She appears well-developed and well-nourished.  Eyes: Conjunctivae are normal.  Neck: Thyroid mass present. No thyromegaly present.    Cardiovascular: Normal rate, regular rhythm, normal heart sounds and normal pulses.     Pulmonary/Chest: Effort normal and breath sounds normal. She has no wheezes. She has no rhonchi. She has no rales.  Neurological: She is alert.  Skin: Skin is warm and dry.  Psychiatric: She has a normal mood and affect. Her speech is normal and behavior is normal. Thought content normal.  Vitals reviewed.      Assessment & Plan:   Problem List Items Addressed This Visit      Endocrine   Thyroid nodule - Primary    Cervical MRI shows right thyroid nodule. I have ordered ultrasound of thyroid an. I went ahead and placed a referral to endocrine to expedite the process after ultrasound has been done. Patient is not having any concurrent dysphagia, unintentional weight loss, or palpitations. Note, patient discussed elevation of blood pressure we jointly agreed is most likely due to patient's anxiety about test results. No chest pain, headache. We will follow.      Relevant Orders   US Soft Tissue Head/Neck   Ambulatory referral to Endocrinology    Other Visit Diagnoses   None.      I am having Ms. Odell maintain her loratadine, Vitamin D, ibuprofen, OVER THE COUNTER MEDICATION, Hyaluronic Acid-Vitamin C (HYALURONIC ACID PO), OVER THE COUNTER MEDICATION, Multiple Vitamins-Minerals (ZINC PO), cyclobenzaprine, meloxicam, baclofen, fluticasone, conjugated estrogens, cycloSPORINE, Calcium-Magnesium-Vitamin D (CALCIUM 500 PO), methylPREDNISolone, and doxycycline.   No orders of the defined types were placed in this encounter.   Return precautions given.   Risks, benefits, and alternatives of the medications and treatment plan prescribed today were discussed, and patient expressed understanding.   Education regarding symptom management and diagnosis given to patient on AVS.  Continue to follow with Mable Paris, FNP for routine health maintenance.   Rupinder June Vroom and I agreed with plan.   Mable Paris, FNP  Total of 25 minutes spent with patient, greater than 50% of  which was spent in discussion of  Thyroid nodules, imaging, and FNA.

## 2016-07-17 ENCOUNTER — Encounter: Payer: Self-pay | Admitting: Family

## 2016-07-18 ENCOUNTER — Telehealth: Payer: Self-pay | Admitting: Family

## 2016-07-18 ENCOUNTER — Telehealth: Payer: Self-pay | Admitting: *Deleted

## 2016-07-18 NOTE — Telephone Encounter (Signed)
Please call patient-  No we did not receive.  Does she want to drop off? I am not in the office on Thurs or Friday so it would have to be tomrrow or Wed.   I did receive her mychart message which I forwarded to you about filling this out.

## 2016-07-18 NOTE — Telephone Encounter (Signed)
Pt called to follow up if we received the [employer] paper work/. Paper work is called attending provider statement. It was faxed over on 08/11 around 5pm. Please advise?  Call pt @ [phone]. Thank you!

## 2016-07-18 NOTE — Telephone Encounter (Signed)
Jill Beau do you have this? thanks

## 2016-07-18 NOTE — Telephone Encounter (Signed)
Patient dropped off a form to be filled out by provider, form is in [employer].

## 2016-07-18 NOTE — Telephone Encounter (Signed)
We have not received this.

## 2016-07-19 DIAGNOSIS — Z7689 Persons encountering health services in other specified circumstances: Secondary | ICD-10-CM

## 2016-07-19 NOTE — Telephone Encounter (Signed)
Form has been signed and has been faxed.

## 2016-07-20 ENCOUNTER — Ambulatory Visit: Payer: Managed Care, Other (non HMO)

## 2016-07-21 ENCOUNTER — Ambulatory Visit
Admission: RE | Admit: 2016-07-21 | Discharge: 2016-07-21 | Disposition: A | Discharge status: Home / Self Care | Payer: Managed Care, Other (non HMO) | Source: Ambulatory Visit | Attending: Family | Admitting: Family

## 2016-07-21 DIAGNOSIS — E042 Nontoxic multinodular goiter: Secondary | ICD-10-CM | POA: Insufficient documentation

## 2016-07-21 DIAGNOSIS — E041 Nontoxic single thyroid nodule: Secondary | ICD-10-CM | POA: Diagnosis present

## 2016-07-22 ENCOUNTER — Telehealth: Payer: Self-pay | Admitting: *Deleted

## 2016-07-22 ENCOUNTER — Ambulatory Visit: Payer: Managed Care, Other (non HMO)

## 2016-07-22 NOTE — Telephone Encounter (Signed)
Patient called and is inquiring a about her U/S results . Patient is requestinng a call back [phone].

## 2016-07-22 NOTE — Telephone Encounter (Signed)
Referral was sent to [facility] for Dr. Gabriel Carina. She is still reviewing her chart and asked for me to call her back mon-tues next week to see when she has been scheduled.

## 2016-07-26 ENCOUNTER — Encounter: Payer: Self-pay | Admitting: Family

## 2016-07-26 DIAGNOSIS — Z0189 Encounter for other specified special examinations: Secondary | ICD-10-CM

## 2016-07-26 NOTE — Telephone Encounter (Signed)
Patient stated she will not need this referral since she is heading to [place] and has appointment scheduled [id number].

## 2016-07-27 ENCOUNTER — Encounter: Payer: Self-pay | Admitting: Family

## 2016-07-28 ENCOUNTER — Encounter: Payer: Self-pay | Admitting: Family

## 2016-07-28 ENCOUNTER — Telehealth: Payer: Self-pay | Admitting: *Deleted

## 2016-07-28 ENCOUNTER — Telehealth: Payer: Self-pay | Admitting: Family

## 2016-07-28 NOTE — Telephone Encounter (Signed)
To my knowledge we have not received said form. Please advise.

## 2016-07-28 NOTE — Telephone Encounter (Signed)
Patient also stated that she need a hard copy of [facility] order to be drawn at [facility].

## 2016-07-28 NOTE — Telephone Encounter (Signed)
Patient Name: ALGIA LEWISON DOB: 15-Feb-1951 Initial Comment She has a black vein that is tender between Achilles and ankle. She is unsure of what to do-- She has been on prednisone but is unsure if it is related for back and neck pain Nurse Assessment Nurse: Ronnald Ramp, RN, Miranda Date/Time (Eastern Time): 07/28/2016 10:24:08 AM Confirm and document reason for call. If symptomatic, describe symptoms. You must click the next button to save text entered. ---Caller states she has discolored vein on the back of her right ankle and there is burning at the site. No known injury. Has the patient traveled out of the country within the last 30 days? ---Not Applicable Does the patient have any new or worsening symptoms? ---Yes Will a triage be completed? ---Yes Related visit to physician within the last 2 weeks? ---No Does the PT have any chronic conditions? (i.e. diabetes, asthma, etc.) ---Yes List chronic conditions. ---Back injury recently, Osteoarthritis Is this a behavioral health or substance abuse call? ---No Guidelines Guideline Title Affirmed Question Affirmed Notes Leg Pain Caused by previously diagnosed varicose veins (same pain, worsened by prolonged standing, bulging veins in legs with wormlike appearance) (all triage questions negative) Final Disposition User Parker, RN, Miranda Disagree/Comply: Leta Baptist

## 2016-07-28 NOTE — Telephone Encounter (Signed)
Please advise 

## 2016-07-28 NOTE — Telephone Encounter (Signed)
Spoke to patient and patient denies being seen at this time.  But if SX worsen patient stated she will call to schedule appointment.

## 2016-07-28 NOTE — Telephone Encounter (Signed)
We received paperwork two weeks ago and has been completed and faxed.  It has been refaxed today.

## 2016-07-28 NOTE — Telephone Encounter (Signed)
She is currently out on short term disability . Needing a statement confirming her short term disability. Needing the attending provider form faxed back to the [facility]. This time when you fax it make sure that you received a confirmation.

## 2016-07-28 NOTE — Telephone Encounter (Signed)
Ann from [employer] has requested to know the progress of the provider statement form for disability  Contact Ann @ [phone] Ext [phone]

## 2016-07-29 ENCOUNTER — Telehealth: Payer: Self-pay | Admitting: Family

## 2016-07-29 NOTE — Telephone Encounter (Signed)
Patient has requested confirmation,due to the dead line being 21-Aug-2023.

## 2016-07-29 NOTE — Telephone Encounter (Signed)
Ms. Qiao called saying she's been speaking to [facility] and was told to call him back with email information regarding her short-term disability. The email address is: [email or web address]  Please give her a call if needed.  Pt's ph# [phone] Thank you.

## 2016-07-29 NOTE — Telephone Encounter (Signed)
Paperwork has been emailed to [email or web address]

## 2016-07-29 NOTE — Telephone Encounter (Signed)
Paperwork has been [employer].

## 2016-07-29 NOTE — Telephone Encounter (Signed)
Informed patient that the Fax number on paperwork has not been working.  Patient will call company and receive an email address we can forward to.

## 2016-08-01 ENCOUNTER — Other Ambulatory Visit: Payer: Self-pay | Admitting: Family

## 2016-08-03 ENCOUNTER — Telehealth: Payer: Self-pay | Admitting: Family

## 2016-08-03 LAB — NICOTINE METABOLITE, SERUM: NICOTINE: NEGATIVE

## 2016-08-03 NOTE — Telephone Encounter (Signed)
I don't see labs done on 8/28, please advise, thanks

## 2016-08-03 NOTE — Telephone Encounter (Signed)
Pt called to see if lab results are in from 08/01/16. Please advise?  Call pt @ [phone]. Thank you!

## 2016-08-04 ENCOUNTER — Encounter: Payer: Self-pay | Admitting: Family

## 2016-08-04 NOTE — Telephone Encounter (Signed)
Left message for patient to return call back.  

## 2016-08-04 NOTE — Telephone Encounter (Signed)
Nicotine lab 8/22 ordered for patient at her request however that was not done. No other labs have been ordered from our office.   Please let patient know.

## 2016-08-09 NOTE — Telephone Encounter (Signed)
Left message for patient to return call back.  

## 2016-08-09 NOTE — Telephone Encounter (Signed)
Patient has re called [facility] for results to be faxed here.

## 2016-08-11 NOTE — Telephone Encounter (Signed)
Patient is having [facility] refax results to [place]. Since we have not received the results.

## 2016-08-15 ENCOUNTER — Telehealth: Payer: Self-pay | Admitting: Family

## 2016-08-15 ENCOUNTER — Encounter: Payer: Self-pay | Admitting: Family

## 2016-08-15 NOTE — Telephone Encounter (Signed)
Pt dropped of paper work from the [employer]  to be completed by [employer]. Paper work is up front in [employer], [employer] (life sciences).

## 2016-08-15 NOTE — Telephone Encounter (Signed)
Paperwork has been placed in folder on [place] desk.

## 2016-08-16 NOTE — Telephone Encounter (Signed)
Patient form has been faxed today.  Patient will Follow Up on paperwork. If company does not receive fax. we will Email.

## 2016-08-19 ENCOUNTER — Encounter: Payer: Self-pay | Admitting: Family

## 2016-08-19 DIAGNOSIS — E042 Nontoxic multinodular goiter: Secondary | ICD-10-CM | POA: Insufficient documentation

## 2016-08-19 NOTE — Telephone Encounter (Signed)
Patient stated the fax was not received, however the email address is : [email or web address]

## 2016-08-19 NOTE — Telephone Encounter (Signed)
Its has been sent via email.

## 2016-08-24 ENCOUNTER — Encounter: Payer: Self-pay | Admitting: Family

## 2016-08-29 ENCOUNTER — Telehealth: Payer: Self-pay | Admitting: *Deleted

## 2016-08-29 NOTE — Telephone Encounter (Signed)
Ann from [employer] has requested confirmation of surgery, and operative report , if operative report is not available Lelon Frohlich will need to know the surgeon and phone number Contact [phone] Ext [phone]

## 2016-08-29 NOTE — Telephone Encounter (Signed)
Have you heard or know of the surgeon? If not I will call patient to ask. Please advise.

## 2016-08-29 NOTE — Telephone Encounter (Signed)
Please advise 

## 2016-08-30 NOTE — Telephone Encounter (Signed)
Patient faxed paperwork to ANN.  Surgeons name was Dr.Sanziana Roman. Contact # [phone]

## 2016-08-30 NOTE — Telephone Encounter (Signed)
Sorry, no idea.   Would you mind calling patient?

## 2016-09-13 ENCOUNTER — Ambulatory Visit (INDEPENDENT_AMBULATORY_CARE_PROVIDER_SITE_OTHER): Payer: Managed Care, Other (non HMO) | Admitting: Family

## 2016-09-13 ENCOUNTER — Encounter: Payer: Self-pay | Admitting: Family

## 2016-09-13 VITALS — BP 130/76 | HR 89 | Temp 98.4°F | Ht 65.0 in | Wt 160.0 lb

## 2016-09-13 DIAGNOSIS — D509 Iron deficiency anemia, unspecified: Secondary | ICD-10-CM | POA: Diagnosis not present

## 2016-09-13 DIAGNOSIS — Z13820 Encounter for screening for osteoporosis: Secondary | ICD-10-CM

## 2016-09-13 DIAGNOSIS — N952 Postmenopausal atrophic vaginitis: Secondary | ICD-10-CM | POA: Diagnosis not present

## 2016-09-13 DIAGNOSIS — E041 Nontoxic single thyroid nodule: Secondary | ICD-10-CM

## 2016-09-13 DIAGNOSIS — M545 Low back pain: Secondary | ICD-10-CM

## 2016-09-13 DIAGNOSIS — G8929 Other chronic pain: Secondary | ICD-10-CM

## 2016-09-13 NOTE — Assessment & Plan Note (Signed)
Stable on baclofen and Mobic. [facility]. Advised patient to start Prilosec since she's taking the Mobic daily.

## 2016-09-13 NOTE — Assessment & Plan Note (Signed)
Pleased to see benign finding of thyroid. Patient and I discussed her routine surveillance of TSH and annual thyroid ultrasound over the next couple of years to ensure adenoma  does not return.

## 2016-09-13 NOTE — Progress Notes (Signed)
Pre visit review using our clinic review tool, if applicable. No additional management support is needed unless otherwise documented below in the visit note. 

## 2016-09-13 NOTE — Patient Instructions (Addendum)
Start prilosec daily since on mobic.   Consider progesterone to oppose estrogen.   Follow up with physical.

## 2016-09-13 NOTE — Progress Notes (Signed)
Subjective:    Patient ID: Jill Michael, female    DOB: 09/01/1951, 65 y.o.   MRN: [id number]  CC: Jill Michael is a 65 y.o. female who presents today for follow up.   HPI: Patient here for follow-up. 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 She is feeling well. She does not follow with endocrine and no mention of surveillance. Pending TSH.   Chronic cervical and lumbar back pain- Stable. Baclofen and Mobic works well. Takes both everyday. Follows with Dr. Patrice Michael / Jill Michael at [facility].   Atrophic vaginitis- On premarin cream. Had been on Vagifem until cost became high. Not using twice a week. Has uterus. No h/o GYN cancer.       HISTORY:  Past Medical History:  Diagnosis Date  . Allergy    hay fever  . Arthritis   . Arthritis    right knee, followed by Jill Michael  . Chicken pox   . Low back pain    recurrent  . Scoliosis   . UTI (urinary tract infection)    Past Surgical History:  Procedure Laterality Date  . BREAST BIOPSY Right 2000 and 2005   fibroadenoma right breast x2  . NASAL SEPTUM SURGERY    . partial knee replacement Right 2013   Dr. Jefm Michael  . semithyroidectomy of right side  08/2106  . TUBAL LIGATION    . VAGINAL DELIVERY     2   Family History  Problem Relation Age of Onset  . Cancer Mother     breast  . Breast cancer Mother 63  . Cancer Father     lung    Allergies: Penicillin v potassium and Penicillins Current Outpatient Prescriptions on File Prior to Visit  Medication Sig Dispense Refill  . baclofen (LIORESAL) 10 MG tablet     . Calcium-Magnesium-Vitamin D (CALCIUM 500 PO) Take by mouth.    . Cholecalciferol (VITAMIN D) 2000 UNITS CAPS Take 1 capsule by mouth daily.    Marland Kitchen conjugated estrogens (PREMARIN) vaginal cream Place 1 Applicatorful vaginally 2 (two) times a week. 127.5 g 3  . cycloSPORINE (RESTASIS) 0.05 % ophthalmic emulsion Place 1  drop into both eyes 2 (two) times daily. 180 each 3  . fluticasone (FLONASE) 50 MCG/ACT nasal spray Place 2 sprays into both nostrils daily. 48 g 3  . Hyaluronic Acid-Vitamin C (HYALURONIC ACID PO) Take 2 capsules by mouth daily.    Marland Kitchen ibuprofen (ADVIL,MOTRIN) 200 MG tablet Take 200 mg by mouth every 6 (six) hours as needed for pain.    Marland Kitchen loratadine (CLARITIN) 10 MG tablet Take 10 mg by mouth daily.    . meloxicam (MOBIC) 15 MG tablet     . Multiple Vitamins-Minerals (ZINC PO) Take by mouth.    Marland Kitchen OVER THE COUNTER MEDICATION Take 3 capsules by mouth daily. Instaflex    . OVER THE COUNTER MEDICATION Take 1 capsule by mouth 2 (two) times daily. Tumeric     No current facility-administered medications on file prior to visit.     Social History  Substance Use Topics  . Smoking status: Never Smoker  . Smokeless tobacco: Never Used  . Alcohol use Not on file    Review of Systems  Constitutional: Negative for chills and fever.  Respiratory: Negative for cough.   Cardiovascular: Negative for chest pain and palpitations.  Gastrointestinal: Negative for nausea and vomiting.  Endocrine: Negative for cold intolerance and heat  intolerance.      Objective:    BP 130/76   Pulse 89   Temp 98.4 F (36.9 C) (Oral)   Ht 5\' 5"  (1.651 m)   Wt 160 lb (72.6 kg)   SpO2 96%   BMI 26.63 kg/m  BP Readings from Last 3 Encounters:  09/13/16 130/76  07/14/16 (!) 156/76  06/15/16 116/82   Wt Readings from Last 3 Encounters:  09/13/16 160 lb (72.6 kg)  07/14/16 157 lb 12.8 oz (71.6 kg)  06/15/16 157 lb 2 oz (71.3 kg)    Physical Exam  Constitutional: She appears well-developed and well-nourished.  Eyes: Conjunctivae are normal.  Neck: No thyroid mass and no thyromegaly present.    Well approximated incision noted on diagram. No drainage, erythema.  Cardiovascular: Normal rate, regular rhythm, normal heart sounds and normal pulses.   Pulmonary/Chest: Effort normal and breath sounds normal. She  has no wheezes. She has no rhonchi. She has no rales.  Lymphadenopathy:       Head (right side): No submental, no submandibular, no tonsillar, no preauricular, no posterior auricular and no occipital adenopathy present.       Head (left side): No submental, no submandibular, no tonsillar, no preauricular, no posterior auricular and no occipital adenopathy present.    She has no cervical adenopathy.  Neurological: She is alert.  Skin: Skin is warm and dry.  Psychiatric: She has a normal mood and affect. Her speech is normal and behavior is normal. Thought content normal.  Vitals reviewed.      Assessment & Plan:   Problem List Items Addressed This Visit      Endocrine   Thyroid nodule - Primary    Pleased to see benign finding of thyroid. Patient and I discussed her routine surveillance of TSH and annual thyroid ultrasound over the next couple of years to ensure adenoma  does not return.        Genitourinary   Atrophic vaginitis    Patient discussed risks of vaginal estrogen. Patient is not on opposing progesterone at this time; patient and I discussed risk of this as she has an intact uterus. Patient will consider my recommendation of adding Prometrium daily; we will further discuss at CPE.        Other   Chronic low back pain    Stable on baclofen and Mobic. Follows the spine center. Advised patient to start Prilosec since she's taking the Mobic daily.       Other Visit Diagnoses    Screening for osteoporosis       Relevant Orders   DG Bone Density   B12 and Folate Panel   Iron deficiency anemia, unspecified iron deficiency anemia type       Relevant Orders   IBC panel   CBC with Differential/Platelet       I have discontinued Jill Michael cyclobenzaprine, methylPREDNISolone, and doxycycline. I am also having her maintain her loratadine, Vitamin D, ibuprofen, OVER THE COUNTER MEDICATION, Hyaluronic Acid-Vitamin C (HYALURONIC ACID PO), OVER THE COUNTER MEDICATION,  Multiple Vitamins-Minerals (ZINC PO), meloxicam, baclofen, fluticasone, conjugated estrogens, cycloSPORINE, and Calcium-Magnesium-Vitamin D (CALCIUM 500 PO).   No orders of the defined types were placed in this encounter.   Return precautions given.   Risks, benefits, and alternatives of the medications and treatment plan prescribed today were discussed, and patient expressed understanding.   Education regarding symptom management and diagnosis given to patient on AVS.  Continue to follow with Mable Paris, FNP for routine health  maintenance.   Sita June Loeffler and I agreed with plan.   Mable Paris, FNP

## 2016-09-13 NOTE — Assessment & Plan Note (Signed)
Patient discussed risks of vaginal estrogen. Patient is not on opposing progesterone at this time; patient and I discussed risk of this as she has an intact uterus. Patient will consider my recommendation of adding Prometrium daily; we will further discuss at CPE.

## 2016-09-14 ENCOUNTER — Encounter: Payer: Self-pay | Admitting: Family

## 2016-09-15 ENCOUNTER — Encounter: Payer: Self-pay | Admitting: Family

## 2016-09-21 ENCOUNTER — Other Ambulatory Visit: Payer: Self-pay | Admitting: Orthopaedic Surgery

## 2016-09-21 DIAGNOSIS — M4150 Other secondary scoliosis, site unspecified: Secondary | ICD-10-CM

## 2016-10-05 ENCOUNTER — Encounter: Payer: Self-pay | Admitting: Family

## 2016-10-06 ENCOUNTER — Encounter: Payer: Self-pay | Admitting: Family

## 2016-10-07 ENCOUNTER — Telehealth: Payer: Self-pay | Admitting: *Deleted

## 2016-10-07 ENCOUNTER — Telehealth: Payer: Self-pay

## 2016-10-07 ENCOUNTER — Other Ambulatory Visit: Payer: Self-pay | Admitting: Family

## 2016-10-07 ENCOUNTER — Other Ambulatory Visit: Payer: Self-pay

## 2016-10-07 DIAGNOSIS — D509 Iron deficiency anemia, unspecified: Secondary | ICD-10-CM

## 2016-10-07 DIAGNOSIS — Z1231 Encounter for screening mammogram for malignant neoplasm of breast: Secondary | ICD-10-CM

## 2016-10-07 DIAGNOSIS — Z13820 Encounter for screening for osteoporosis: Secondary | ICD-10-CM

## 2016-10-07 NOTE — Progress Notes (Signed)
Order(s) created erroneously. Erroneous order ID: [id number]  Order moved by: Willy Eddy  Order move date/time: 10/07/2016 10:30 AM  Source Patient: [id number]  Source Contact: 09/13/2016  Destination Patient: [id number]  Destination Contact: 02/19/2013

## 2016-10-07 NOTE — Progress Notes (Signed)
Order(s) created erroneously. Erroneous order ID: [id number]  Order moved by: Willy Eddy  Order move date/time: 10/07/2016 10:30 AM  Source Patient: [id number]  Source Contact: 09/13/2016  Destination Patient: [id number]  Destination Contact: 02/19/2013

## 2016-10-07 NOTE — Progress Notes (Signed)
Order(s) created erroneously. Erroneous order ID: [id number]  Order moved by: Willy Eddy  Order move date/time: 10/07/2016 10:31 AM  Source Patient: [id number]  Source Contact: 09/13/2016  Destination Patient: [id number]  Destination Contact: 02/19/2013

## 2016-10-07 NOTE — Telephone Encounter (Signed)
Labs ordered for patient to have drawn at [facility].

## 2016-10-07 NOTE — Telephone Encounter (Signed)
I have sent a message to Epic to have labs & charges removed from 09/13/16.

## 2016-10-11 ENCOUNTER — Ambulatory Visit: Payer: Managed Care, Other (non HMO)

## 2016-10-13 LAB — CBC WITH DIFFERENTIAL/PLATELET
BASOS: 1 %
Basophils Absolute: 0.1 10*3/uL (ref 0.0–0.2)
EOS (ABSOLUTE): 0.2 10*3/uL (ref 0.0–0.4)
EOS: 2 %
Hematocrit: 42.2 % (ref 34.0–46.6)
Hemoglobin: 14.3 g/dL (ref 11.1–15.9)
IMMATURE GRANULOCYTES: 0 %
Immature Grans (Abs): 0 10*3/uL (ref 0.0–0.1)
Lymphocytes Absolute: 1.6 10*3/uL (ref 0.7–3.1)
Lymphs: 22 %
MCH: 30.9 pg (ref 26.6–33.0)
MCHC: 33.9 g/dL (ref 31.5–35.7)
MCV: 91 fL (ref 79–97)
MONOS ABS: 0.7 10*3/uL (ref 0.1–0.9)
Monocytes: 9 %
NEUTROS PCT: 66 %
Neutrophils Absolute: 4.8 10*3/uL (ref 1.4–7.0)
Platelets: 262 10*3/uL (ref 150–379)
RBC: 4.63 x10E6/uL (ref 3.77–5.28)
RDW: 13.4 % (ref 12.3–15.4)
WBC: 7.4 10*3/uL (ref 3.4–10.8)

## 2016-10-13 LAB — IRON AND TIBC
Iron Saturation: 26 % (ref 15–55)
Iron: 70 ug/dL (ref 27–139)
TIBC: 266 ug/dL (ref 250–450)
UIBC: 196 ug/dL (ref 118–369)

## 2016-10-13 LAB — B12 AND FOLATE PANEL
Folate: 11.2 ng/mL (ref >3.0)
Vitamin B-12: 767 pg/mL (ref 211–946)

## 2016-10-23 ENCOUNTER — Ambulatory Visit
Admission: RE | Admit: 2016-10-23 | Discharge: 2016-10-23 | Disposition: A | Discharge status: Home / Self Care | Payer: Managed Care, Other (non HMO) | Source: Ambulatory Visit | Attending: Orthopaedic Surgery | Admitting: Orthopaedic Surgery

## 2016-10-23 DIAGNOSIS — M4150 Other secondary scoliosis, site unspecified: Secondary | ICD-10-CM

## 2016-10-24 ENCOUNTER — Other Ambulatory Visit: Payer: Managed Care, Other (non HMO)

## 2016-11-08 NOTE — Telephone Encounter (Signed)
opened in error

## 2016-11-16 ENCOUNTER — Encounter: Payer: Self-pay | Admitting: Family

## 2016-11-16 ENCOUNTER — Ambulatory Visit: Admission: RE | Admit: 2016-11-16 | Payer: Managed Care, Other (non HMO) | Source: Ambulatory Visit

## 2016-12-19 ENCOUNTER — Encounter: Payer: Self-pay | Admitting: Family

## 2016-12-19 ENCOUNTER — Ambulatory Visit (INDEPENDENT_AMBULATORY_CARE_PROVIDER_SITE_OTHER): Payer: Managed Care, Other (non HMO) | Admitting: Family

## 2016-12-19 VITALS — BP 136/84 | HR 104 | Temp 98.0°F | Ht 65.0 in | Wt 160.0 lb

## 2016-12-19 DIAGNOSIS — Z0000 Encounter for general adult medical examination without abnormal findings: Secondary | ICD-10-CM

## 2016-12-19 DIAGNOSIS — E041 Nontoxic single thyroid nodule: Secondary | ICD-10-CM | POA: Diagnosis not present

## 2016-12-19 DIAGNOSIS — N952 Postmenopausal atrophic vaginitis: Secondary | ICD-10-CM

## 2016-12-19 MED ORDER — FLURANDRENOLIDE 0.05 % EX LOTN: TOPICAL_LOTION | Freq: Every day | CUTANEOUS | 1 refills | Status: DC | PRN

## 2016-12-19 MED ORDER — FLUTICASONE PROPIONATE 50 MCG/ACT NA SUSP: 2.0000 | Freq: Every day | NASAL | 3 refills | Status: DC

## 2016-12-19 MED ORDER — CYCLOSPORINE 0.05 % OP EMUL: 1.0000 [drp] | Freq: Two times a day (BID) | OPHTHALMIC | 3 refills | Status: AC

## 2016-12-19 MED ORDER — ESTRADIOL 0.1 MG/GM VA CREA: TOPICAL_CREAM | VAGINAL | 1 refills | Status: DC

## 2016-12-19 NOTE — Assessment & Plan Note (Signed)
Discussed risks of vaginal estradiol. Patient using very sparingly. Change medication to generic for better insurance coverage.

## 2016-12-19 NOTE — Progress Notes (Signed)
Subjective:    Patient ID: Jill Michael, female    DOB: December 16, 1950, 66 y.o.   MRN: [id number]  CC: Jill Michael is a 66 y.o. female who presents today for physical exam.    HPI: Has since started synthroid 25 mcg. Following with [facility].  No changes to weight, cold/heat intolerance.   Using premarin very sparingly, approx once per weeks. Using for vaginal dryness.   Taking mobic daily and started to take pepcid AC.        Colorectal Cancer Screening: UTD, due in 4 years. No polpys. On 5 year repeat.  Breast Cancer Screening: Mammogram scheduled Cervical Cancer Screening: UTD 2016, Negative for malignancy and HPV. Bone Health screening/DEXA for 65+: 2015 osteopenia. Told cannot have due to insurance until 65 yo. Will due 05/3017.  Lung Cancer Screening: Doesn't have 30 year pack year history and age > 59 years.       Tetanus - UTD Labs: Screening labs today. Exercise: Gets regular exercise.  Alcohol use: Occasional Smoking/tobacco use: Nonsmoker.  Regular dental exams: UTD Wears seat belt: Yes. Skin: follows with dermatology for annual skin. Diagnosed with roseasa and eczema. Asking for generic rx for prn cordan.   HISTORY:  Past Medical History:  Diagnosis Date  . Allergy    hay fever  . Arthritis   . Arthritis    right knee, followed by Jill Michael  . Chicken pox   . Low back pain    recurrent  . Scoliosis   . UTI (urinary tract infection)     Past Surgical History:  Procedure Laterality Date  . BREAST BIOPSY Right 2000 and 2005   fibroadenoma right breast x2  . NASAL SEPTUM SURGERY    . partial knee replacement Right 2013   Dr. Jefm Michael  . semithyroidectomy of right side  08/2106  . TUBAL LIGATION    . VAGINAL DELIVERY     2   Family History  Problem Relation Age of Onset  . Cancer Mother     breast  . Breast cancer Mother 7  . Cancer Father     lung      ALLERGIES: Penicillin v potassium and Penicillins  Current Outpatient  Prescriptions on File Prior to Visit  Medication Sig Dispense Refill  . baclofen (LIORESAL) 10 MG tablet     . Calcium-Magnesium-Vitamin D (CALCIUM 500 PO) Take by mouth.    . Cholecalciferol (VITAMIN D) 2000 UNITS CAPS Take 1 capsule by mouth daily.    Marland Kitchen Hyaluronic Acid-Vitamin C (HYALURONIC ACID PO) Take 2 capsules by mouth daily.    Marland Kitchen loratadine (CLARITIN) 10 MG tablet Take 10 mg by mouth daily.    . meloxicam (MOBIC) 15 MG tablet     . Multiple Vitamins-Minerals (ZINC PO) Take by mouth.    Marland Kitchen OVER THE COUNTER MEDICATION Take 3 capsules by mouth daily. Instaflex    . OVER THE COUNTER MEDICATION Take 1 capsule by mouth 2 (two) times daily. Tumeric     No current facility-administered medications on file prior to visit.     Social History  Substance Use Topics  . Smoking status: Never Smoker  . Smokeless tobacco: Never Used  . Alcohol use Yes     Comment: occasional    Review of Systems  Constitutional: Negative for chills, fever and unexpected weight change.  HENT: Negative for congestion.   Respiratory: Negative for cough.   Cardiovascular: Negative for chest pain, palpitations and leg swelling.  Gastrointestinal: Negative for  nausea and vomiting.  Genitourinary: Negative for vaginal bleeding, vaginal discharge and vaginal pain.  Musculoskeletal: Negative for arthralgias and myalgias.  Skin: Negative for rash.  Neurological: Negative for headaches.  Hematological: Negative for adenopathy.  Psychiatric/Behavioral: Negative for confusion.      Objective:    BP 136/84   Pulse (!) 104   Temp 98 F (36.7 C) (Oral)   Ht 5\' 5"  (1.651 m)   Wt 160 lb (72.6 kg)   SpO2 97%   BMI 26.63 kg/m   BP Readings from Last 3 Encounters:  12/19/16 136/84  09/13/16 130/76  07/14/16 (!) 156/76   Wt Readings from Last 3 Encounters:  12/19/16 160 lb (72.6 kg)  09/13/16 160 lb (72.6 kg)  07/14/16 157 lb 12.8 oz (71.6 kg)    Physical Exam  Constitutional: She appears well-developed  and well-nourished.  Eyes: Conjunctivae are normal.  Neck: No thyroid mass and no thyromegaly present.  Cardiovascular: Normal rate, regular rhythm, normal heart sounds and normal pulses.   Pulmonary/Chest: Effort normal and breath sounds normal. She has no wheezes. She has no rhonchi. She has no rales. Right breast exhibits no inverted nipple, no mass, no nipple discharge, no skin change and no tenderness. Left breast exhibits no inverted nipple, no mass, no nipple discharge, no skin change and no tenderness. Breasts are symmetrical.  CBE performed.   Lymphadenopathy:       Head (right side): No submental, no submandibular, no tonsillar, no preauricular, no posterior auricular and no occipital adenopathy present.       Head (left side): No submental, no submandibular, no tonsillar, no preauricular, no posterior auricular and no occipital adenopathy present.    She has no cervical adenopathy.       Right cervical: No superficial cervical, no deep cervical and no posterior cervical adenopathy present.      Left cervical: No superficial cervical, no deep cervical and no posterior cervical adenopathy present.    She has no axillary adenopathy.  Neurological: She is alert.  Skin: Skin is warm and dry.  Psychiatric: She has a normal mood and affect. Her speech is normal and behavior is normal. Thought content normal.  Vitals reviewed.      Assessment & Plan:   Problem List Items Addressed This Visit      Endocrine   Thyroid nodule    Clinically asymptomatic. Benign thyroid exam. Patient and I discussed we will do annual thyroid ultrasound in her birthday month of [birth year]. Pending tsh.       Relevant Medications   levothyroxine (SYNTHROID, LEVOTHROID) 25 MCG tablet     Genitourinary   Atrophic vaginitis    Discussed risks of vaginal estradiol. Patient using very sparingly. Change medication to generic for better insurance coverage.      Relevant Medications   estradiol (ESTRACE) 0.1  MG/GM vaginal cream   flurandrenolide (CORDRAN) 0.05 % lotion     Other   Routine general medical examination at a health care facility - Primary    Up-to-date on her colonoscopy. CBE done today. She scheduled her mammogram for later this week. She is also up-to-date on her Pap smear. We will defer DEXA until June when she is 66 yo. Encouraged continued exercise. Screening labs ordered Immunizations up-to-date.      Relevant Medications   fluticasone (FLONASE) 50 MCG/ACT nasal spray   cycloSPORINE (RESTASIS) 0.05 % ophthalmic emulsion   flurandrenolide (CORDRAN) 0.05 % lotion   Other Relevant Orders   CBC with Differential/Platelet  Comprehensive metabolic panel   Hemoglobin A1c   Lipid panel   TSH   VITAMIN D 25 Hydroxy (Vit-D Deficiency, Fractures)       I have discontinued Ms. Heath ibuprofen and conjugated estrogens. I am also having her start on estradiol and flurandrenolide. Additionally, I am having her maintain her loratadine, Vitamin D, OVER THE COUNTER MEDICATION, Hyaluronic Acid-Vitamin C (HYALURONIC ACID PO), OVER THE COUNTER MEDICATION, Multiple Vitamins-Minerals (ZINC PO), meloxicam, baclofen, Calcium-Magnesium-Vitamin D (CALCIUM 500 PO), levothyroxine, fluticasone, and cycloSPORINE.   Meds ordered this encounter  Medications  . levothyroxine (SYNTHROID, LEVOTHROID) 25 MCG tablet    Sig: Take 25 mcg by mouth every morning.    Refill:  11  . fluticasone (FLONASE) 50 MCG/ACT nasal spray    Sig: Place 2 sprays into both nostrils daily.    Dispense:  48 g    Refill:  3    Please give 3 bottles with 3 refills    Order Specific Question:   Supervising Provider    Answer:   Jill Michael [[id number]]  . cycloSPORINE (RESTASIS) 0.05 % ophthalmic emulsion    Sig: Place 1 drop into both eyes 2 (two) times daily.    Dispense:  180 each    Refill:  3    240 vials, for total of 8 boxes    Order Specific Question:   Supervising Provider    Answer:   Jill Michael, Jill Michael [[id number]]   . estradiol (ESTRACE) 0.1 MG/GM vaginal cream    Sig: 0.5 g intravaginally 1-3 times per week.    Dispense:  42.5 g    Refill:  1    Order Specific Question:   Supervising Provider    Answer:   Jill Michael, Jill Michael [[id number]]  . flurandrenolide (CORDRAN) 0.05 % lotion    Sig: Apply topically daily as needed. Do not use on face.    Dispense:  60 mL    Refill:  1    Order Specific Question:   Supervising Provider    Answer:   Jill Michael [[id number]]    Return precautions given.   Risks, benefits, and alternatives of the medications and treatment plan prescribed today were discussed, and patient expressed understanding.   Education regarding symptom management and diagnosis given to patient on AVS.   Continue to follow with Jill Paris, Jill Michael for routine health maintenance.   Jill Michael and I agreed with plan.   Jill Paris, Jill Michael

## 2016-12-19 NOTE — Assessment & Plan Note (Signed)
Up-to-date on her colonoscopy. CBE done today. She scheduled her mammogram for later this week. She is also up-to-date on her Pap smear. We will defer DEXA until June when she is 66 yo. Encouraged continued exercise. Screening labs ordered Immunizations up-to-date.

## 2016-12-19 NOTE — Patient Instructions (Signed)
Start omeprazole '20mg'$  daily since on Mobic daily  Pleasure seeing you   Health Maintenance for Postmenopausal Women Introduction Menopause is a normal process in which your reproductive ability comes to an end. This process happens gradually over a span of months to years, usually between the ages of 46 and 75. Menopause is complete when you have missed 12 consecutive menstrual periods. It is important to talk with your health care provider about some of the most common conditions that affect postmenopausal women, such as heart disease, cancer, and bone loss (osteoporosis). Adopting a healthy lifestyle and getting preventive care can help to promote your health and wellness. Those actions can also lower your chances of developing some of these common conditions. What should I know about menopause? During menopause, you may experience a number of symptoms, such as:  Moderate-to-severe hot flashes.  Night sweats.  Decrease in sex drive.  Mood swings.  Headaches.  Tiredness.  Irritability.  Memory problems.  Insomnia. Choosing to treat or not to treat menopausal changes is an individual decision that you make with your health care provider. What should I know about hormone replacement therapy and supplements? Hormone therapy products are effective for treating symptoms that are associated with menopause, such as hot flashes and night sweats. Hormone replacement carries certain risks, especially as you become older. If you are thinking about using estrogen or estrogen with progestin treatments, discuss the benefits and risks with your health care provider. What should I know about heart disease and stroke? Heart disease, heart attack, and stroke become more likely as you age. This may be due, in part, to the hormonal changes that your body experiences during menopause. These can affect how your body processes dietary fats, triglycerides, and cholesterol. Heart attack and stroke are both  medical emergencies. There are many things that you can do to help prevent heart disease and stroke:  Have your blood pressure checked at least every 1-2 years. High blood pressure causes heart disease and increases the risk of stroke.  If you are 53-45 years old, ask your health care provider if you should take aspirin to prevent a heart attack or a stroke.  Do not use any tobacco products, including cigarettes, chewing tobacco, or electronic cigarettes. If you need help quitting, ask your health care provider.  It is important to eat a healthy diet and maintain a healthy weight.  Be sure to include plenty of vegetables, fruits, low-fat dairy products, and lean protein.  Avoid eating foods that are high in solid fats, added sugars, or salt (sodium).  Get regular exercise. This is one of the most important things that you can do for your health.  Try to exercise for at least 150 minutes each week. The type of exercise that you do should increase your heart rate and make you sweat. This is known as moderate-intensity exercise.  Try to do strengthening exercises at least twice each week. Do these in addition to the moderate-intensity exercise.  Know your numbers.Ask your health care provider to check your cholesterol and your blood glucose. Continue to have your blood tested as directed by your health care provider. What should I know about cancer screening? There are several types of cancer. Take the following steps to reduce your risk and to catch any cancer development as early as possible. Breast Cancer  Practice breast self-awareness.  This means understanding how your breasts normally appear and feel.  It also means doing regular breast self-exams. Let your health care provider  know about any changes, no matter how small.  If you are 62 or older, have a clinician do a breast exam (clinical breast exam or CBE) every year. Depending on your age, family history, and medical history,  it may be recommended that you also have a yearly breast X-ray (mammogram).  If you have a family history of breast cancer, talk with your health care provider about genetic screening.  If you are at high risk for breast cancer, talk with your health care provider about having an MRI and a mammogram every year.  Breast cancer (BRCA) gene test is recommended for women who have family members with BRCA-related cancers. Results of the assessment will determine the need for genetic counseling and BRCA1 and for BRCA2 testing. BRCA-related cancers include these types:  Breast. This occurs in males or females.  Ovarian.  Tubal. This may also be called fallopian tube cancer.  Cancer of the abdominal or pelvic lining (peritoneal cancer).  Prostate.  Pancreatic. Cervical, Uterine, and Ovarian Cancer  Your health care provider may recommend that you be screened regularly for cancer of the pelvic organs. These include your ovaries, uterus, and vagina. This screening involves a pelvic exam, which includes checking for microscopic changes to the surface of your cervix (Pap test).  For women ages 21-65, health care providers may recommend a pelvic exam and a Pap test every three years. For women ages 70-65, they may recommend the Pap test and pelvic exam, combined with testing for human papilloma virus (HPV), every five years. Some types of HPV increase your risk of cervical cancer. Testing for HPV may also be done on women of any age who have unclear Pap test results.  Other health care providers may not recommend any screening for nonpregnant women who are considered low risk for pelvic cancer and have no symptoms. Ask your health care provider if a screening pelvic exam is right for you.  If you have had past treatment for cervical cancer or a condition that could lead to cancer, you need Pap tests and screening for cancer for at least 20 years after your treatment. If Pap tests have been discontinued  for you, your risk factors (such as having a new sexual partner) need to be reassessed to determine if you should start having screenings again. Some women have medical problems that increase the chance of getting cervical cancer. In these cases, your health care provider may recommend that you have screening and Pap tests more often.  If you have a family history of uterine cancer or ovarian cancer, talk with your health care provider about genetic screening.  If you have vaginal bleeding after reaching menopause, tell your health care provider.  There are currently no reliable tests available to screen for ovarian cancer. Lung Cancer  Lung cancer screening is recommended for adults 28-39 years old who are at high risk for lung cancer because of a history of smoking. A yearly low-dose CT scan of the lungs is recommended if you:  Currently smoke.  Have a history of at least 30 pack-years of smoking and you currently smoke or have quit within the past 15 years. A pack-year is smoking an average of one pack of cigarettes per day for one year. Yearly screening should:  Continue until it has been 15 years since you quit.  Stop if you develop a health problem that would prevent you from having lung cancer treatment. Colorectal Cancer  This type of cancer can be detected and can  often be prevented.  Routine colorectal cancer screening usually begins at age 31 and continues through age 34.  If you have risk factors for colon cancer, your health care provider may recommend that you be screened at an earlier age.  If you have a family history of colorectal cancer, talk with your health care provider about genetic screening.  Your health care provider may also recommend using home test kits to check for hidden blood in your stool.  A small camera at the end of a tube can be used to examine your colon directly (sigmoidoscopy or colonoscopy). This is done to check for the earliest forms of  colorectal cancer.  Direct examination of the colon should be repeated every 5-10 years until age 38. However, if early forms of precancerous polyps or small growths are found or if you have a family history or genetic risk for colorectal cancer, you may need to be screened more often. Skin Cancer  Check your skin from head to toe regularly.  Monitor any moles. Be sure to tell your health care provider:  About any new moles or changes in moles, especially if there is a change in a mole's shape or color.  If you have a mole that is larger than the size of a pencil eraser.  If any of your family members has a history of skin cancer, especially at a young age, talk with your health care provider about genetic screening.  Always use sunscreen. Apply sunscreen liberally and repeatedly throughout the day.  Whenever you are outside, protect yourself by wearing long sleeves, pants, a wide-brimmed hat, and sunglasses. What should I know about osteoporosis? Osteoporosis is a condition in which bone destruction happens more quickly than new bone creation. After menopause, you may be at an increased risk for osteoporosis. To help prevent osteoporosis or the bone fractures that can happen because of osteoporosis, the following is recommended:  If you are 82-32 years old, get at least 1,000 mg of calcium and at least 600 mg of vitamin D per day.  If you are older than age 16 but younger than age 58, get at least 1,200 mg of calcium and at least 600 mg of vitamin D per day.  If you are older than age 80, get at least 1,200 mg of calcium and at least 800 mg of vitamin D per day. Smoking and excessive alcohol intake increase the risk of osteoporosis. Eat foods that are rich in calcium and vitamin D, and do weight-bearing exercises several times each week as directed by your health care provider. What should I know about how menopause affects my mental health? Depression may occur at any age, but it is  more common as you become older. Common symptoms of depression include:  Low or sad mood.  Changes in sleep patterns.  Changes in appetite or eating patterns.  Feeling an overall lack of motivation or enjoyment of activities that you previously enjoyed.  Frequent crying spells. Talk with your health care provider if you think that you are experiencing depression. What should I know about immunizations? It is important that you get and maintain your immunizations. These include:  Tetanus, diphtheria, and pertussis (Tdap) booster vaccine.  Influenza every year before the flu season begins.  Pneumonia vaccine.  Shingles vaccine. Your health care provider may also recommend other immunizations. This information is not intended to replace advice given to you by your health care provider. Make sure you discuss any questions you have with your  health care provider. Document Released: 01/13/2006 Document Revised: 06/10/2016 Document Reviewed: 08/25/2015  2017 [employer]

## 2016-12-19 NOTE — Progress Notes (Signed)
Pre visit review using our clinic review tool, if applicable. No additional management support is needed unless otherwise documented below in the visit note. 

## 2016-12-19 NOTE — Assessment & Plan Note (Signed)
Clinically asymptomatic. Benign thyroid exam. Patient and I discussed we will do annual thyroid ultrasound in her birthday month of [birth year]. Pending tsh.

## 2016-12-23 ENCOUNTER — Ambulatory Visit
Admission: RE | Admit: 2016-12-23 | Discharge: 2016-12-23 | Disposition: A | Discharge status: Home / Self Care | Payer: Managed Care, Other (non HMO) | Source: Ambulatory Visit | Attending: Family | Admitting: Family

## 2016-12-23 DIAGNOSIS — Z1231 Encounter for screening mammogram for malignant neoplasm of breast: Secondary | ICD-10-CM | POA: Diagnosis present

## 2017-01-03 ENCOUNTER — Ambulatory Visit: Payer: Managed Care, Other (non HMO) | Admitting: Family

## 2017-01-10 ENCOUNTER — Ambulatory Visit (INDEPENDENT_AMBULATORY_CARE_PROVIDER_SITE_OTHER): Payer: Managed Care, Other (non HMO) | Admitting: Family

## 2017-01-10 ENCOUNTER — Encounter: Payer: Self-pay | Admitting: Family

## 2017-01-10 VITALS — BP 130/80 | HR 106 | Temp 98.1°F | Ht 65.0 in | Wt 160.4 lb

## 2017-01-10 DIAGNOSIS — M7918 Myalgia, other site: Secondary | ICD-10-CM | POA: Insufficient documentation

## 2017-01-10 DIAGNOSIS — Z0130 Encounter for examination of blood pressure without abnormal findings: Secondary | ICD-10-CM | POA: Insufficient documentation

## 2017-01-10 NOTE — Progress Notes (Signed)
Pre visit review using our clinic review tool, if applicable. No additional management support is needed unless otherwise documented below in the visit note. 

## 2017-01-10 NOTE — Patient Instructions (Signed)
Pleasure seeing you as always. Keep an eye on your blood pressure as discussed. Focus on low salt and continued exercise.

## 2017-01-10 NOTE — Progress Notes (Signed)
Subjective:    Patient ID: Jill Michael, female    DOB: 03-Dec-1951, 66 y.o.   MRN: [id number]  CC: Jill Michael is a 66 y.o. female who presents today for follow up.   HPI: Here for concern of elevations in BP. Prior to procedure last week for spine injection, BP was 160/90, after came down to 130/80. Denies exertional chest pain or pressure, numbness or tingling radiating to left arm or jaw, palpitations, dizziness, frequent headaches, changes in vision, or shortness of breath.  .   Concerned with mobic and increased BP. Does well on mobic which helps knee pain.   Continues to do dry needling and PT for chronic neck pain with some improvement. Exercise program.     Started on amitriptyline at bedtime to help with sleep from Jill Michael, Jill Michael. Also taking baclofen at bedtime. Diagnosed with myofascial pain syndrome in bilateral biceps.     HISTORY:  Past Medical History:  Diagnosis Date  . Allergy    hay fever  . Arthritis   . Arthritis    right knee, followed by Jill Michael  . Chicken pox   . Low back pain    recurrent  . Scoliosis   . UTI (urinary tract infection)    Past Surgical History:  Procedure Laterality Date  . BREAST BIOPSY Right 2000 and 2005   fibroadenoma right breast x2  . NASAL SEPTUM SURGERY    . partial knee replacement Right 2013   Dr. Jefm Michael  . semithyroidectomy of right side  08/2106  . TUBAL LIGATION    . VAGINAL DELIVERY     2   Family History  Problem Relation Age of Onset  . Cancer Mother     breast  . Breast cancer Mother 45  . Cancer Father     lung    Allergies: Penicillin v potassium and Penicillins Current Outpatient Prescriptions on File Prior to Visit  Medication Sig Dispense Refill  . baclofen (LIORESAL) 10 MG tablet     . Calcium-Magnesium-Vitamin D (CALCIUM 500 PO) Take by mouth.    . Cholecalciferol (VITAMIN D) 2000 UNITS CAPS Take 1 capsule by mouth daily.    . cycloSPORINE (RESTASIS) 0.05 % ophthalmic  emulsion Place 1 drop into both eyes 2 (two) times daily. 180 each 3  . estradiol (ESTRACE) 0.1 MG/GM vaginal cream 0.5 g intravaginally 1-3 times per week. 42.5 g 1  . flurandrenolide (CORDRAN) 0.05 % lotion Apply topically daily as needed. Do not use on face. 60 mL 1  . fluticasone (FLONASE) 50 MCG/ACT nasal spray Place 2 sprays into both nostrils daily. 48 g 3  . Hyaluronic Acid-Vitamin C (HYALURONIC ACID PO) Take 2 capsules by mouth daily.    Marland Kitchen levothyroxine (SYNTHROID, LEVOTHROID) 25 MCG tablet Take 25 mcg by mouth every morning.  11  . loratadine (CLARITIN) 10 MG tablet Take 10 mg by mouth daily.    . meloxicam (MOBIC) 15 MG tablet     . Multiple Vitamins-Minerals (ZINC PO) Take by mouth.    Marland Kitchen OVER THE COUNTER MEDICATION Take 3 capsules by mouth daily. Instaflex    . OVER THE COUNTER MEDICATION Take 1 capsule by mouth 2 (two) times daily. Tumeric     No current facility-administered medications on file prior to visit.     Social History  Substance Use Topics  . Smoking status: Never Smoker  . Smokeless tobacco: Never Used  . Alcohol use Yes     Comment: occasional  Review of Systems  Constitutional: Negative for chills and fever.  Respiratory: Negative for cough.   Cardiovascular: Negative for chest pain and palpitations.  Gastrointestinal: Negative for nausea and vomiting.      Objective:    BP 130/80   Pulse (!) 106   Temp 98.1 F (36.7 C) (Oral)   Ht 5\' 5"  (1.651 m)   Wt 160 lb 6.4 oz (72.8 kg)   SpO2 98%   BMI 26.69 kg/m  BP Readings from Last 3 Encounters:  01/10/17 130/80  12/19/16 136/84  09/13/16 130/76   Wt Readings from Last 3 Encounters:  01/10/17 160 lb 6.4 oz (72.8 kg)  12/19/16 160 lb (72.6 kg)  09/13/16 160 lb (72.6 kg)    Physical Exam  Constitutional: She appears well-developed and well-nourished.  Eyes: Conjunctivae are normal.  Cardiovascular: Normal rate, regular rhythm, normal heart sounds and normal pulses.   Pulmonary/Chest:  Effort normal and breath sounds normal. She has no wheezes. She has no rhonchi. She has no rales.  Neurological: She is alert.  Skin: Skin is warm and dry.  Psychiatric: She has a normal mood and affect. Her speech is normal and behavior is normal. Thought content normal.  Vitals reviewed.      Assessment & Plan:   Problem List Items Addressed This Visit      Other   Blood pressure check - Primary    Pre hypertension. Patient had been concerned about periodic elevation in her blood pressure . Education provided on pre-hypertension and exercise, low salt. Patient and I jointly decided to not change medication therapy at this time including discontinuing mobic; we were concerned that blood pressure may increase as her pain becomes uncontrolled. We jointly agreed  likely her pain syndrome is contributing the blood pressure elevation this time. We'll continue to closely monitor patient will follow-up in 3 months.          I am having Jill Michael maintain her loratadine, Vitamin D, OVER THE COUNTER MEDICATION, Hyaluronic Acid-Vitamin C (HYALURONIC ACID PO), OVER THE COUNTER MEDICATION, Multiple Vitamins-Minerals (ZINC PO), meloxicam, baclofen, Calcium-Magnesium-Vitamin D (CALCIUM 500 PO), levothyroxine, fluticasone, cycloSPORINE, estradiol, flurandrenolide, and amitriptyline.   Meds ordered this encounter  Medications  . amitriptyline (ELAVIL) 10 MG tablet    Sig: TAKE 1-3 TABLETS BY MOUTH AT BEDTIME    Refill:  0    Return precautions given.   Risks, benefits, and alternatives of the medications and treatment plan prescribed today were discussed, and patient expressed understanding.   Education regarding symptom management and diagnosis given to patient on AVS.  Continue to follow with Jill Paris, FNP for routine health maintenance.   Jill Michael and I agreed with plan.   Jill Paris, FNP

## 2017-01-10 NOTE — Assessment & Plan Note (Signed)
Pre hypertension. Patient had been concerned about periodic elevation in her blood pressure . Education provided on pre-hypertension and exercise, low salt. Patient and I jointly decided to not change medication therapy at this time including discontinuing mobic; we were concerned that blood pressure may increase as her pain becomes uncontrolled. We jointly agreed  likely her pain syndrome is contributing the blood pressure elevation this time. We'll continue to closely monitor patient will follow-up in 3 months.

## 2017-01-17 ENCOUNTER — Other Ambulatory Visit: Payer: Self-pay | Admitting: Family

## 2017-01-17 ENCOUNTER — Telehealth: Payer: Self-pay | Admitting: Family

## 2017-01-17 NOTE — Telephone Encounter (Signed)
[facility] patient needs to pick up another lab order to go to them.

## 2017-01-17 NOTE — Telephone Encounter (Signed)
Pt was seen last month by Arnett. Arnett ordered lab work for pt. Pt wanted it sent to [facility]. They did not receive orders. Please send to [facility] again. Pt's phone number is [phone].

## 2017-01-17 NOTE — Telephone Encounter (Signed)
Pt d back and stated that there was no duplicate order. It just never transmitted over. They did her labs based on the hard copy that you gave her this morning.

## 2017-01-17 NOTE — Telephone Encounter (Signed)
Patient has received lab orders at front desk.

## 2017-01-18 LAB — COMPREHENSIVE METABOLIC PANEL
ALT: 17 IU/L (ref 0–32)
AST: 21 IU/L (ref 0–40)
Albumin/Globulin Ratio: 1.7 (ref 1.2–2.2)
Albumin: 4 g/dL (ref 3.6–4.8)
Alkaline Phosphatase: 40 IU/L (ref 39–117)
BUN/Creatinine Ratio: 26 (ref 12–28)
BUN: 17 mg/dL (ref 8–27)
Bilirubin Total: 0.6 mg/dL (ref 0.0–1.2)
CALCIUM: 8.7 mg/dL (ref 8.7–10.3)
CO2: 28 mmol/L (ref 18–29)
CREATININE: 0.65 mg/dL (ref 0.57–1.00)
Chloride: 102 mmol/L (ref 96–106)
GFR, EST AFRICAN AMERICAN: 109 mL/min/{1.73_m2} (ref >59)
GFR, EST NON AFRICAN AMERICAN: 94 mL/min/{1.73_m2} (ref >59)
GLOBULIN, TOTAL: 2.4 g/dL (ref 1.5–4.5)
Glucose: 86 mg/dL (ref 65–99)
Potassium: 4.5 mmol/L (ref 3.5–5.2)
SODIUM: 141 mmol/L (ref 134–144)
TOTAL PROTEIN: 6.4 g/dL (ref 6.0–8.5)

## 2017-01-18 LAB — HEMOGLOBIN A1C
Est. average glucose Bld gHb Est-mCnc: 103 mg/dL
HEMOGLOBIN A1C: 5.2 % (ref 4.8–5.6)

## 2017-01-18 LAB — VITAMIN D 25 HYDROXY (VIT D DEFICIENCY, FRACTURES): Vit D, 25-Hydroxy: 51.5 ng/mL (ref 30.0–100.0)

## 2017-01-18 LAB — LIPID PANEL
CHOL/HDL RATIO: 3.5 ratio (ref 0.0–4.4)
Cholesterol, Total: 190 mg/dL (ref 100–199)
HDL: 55 mg/dL (ref >39)
LDL CALC: 120 mg/dL — AB (ref 0–99)
TRIGLYCERIDES: 76 mg/dL (ref 0–149)
VLDL Cholesterol Cal: 15 mg/dL (ref 5–40)

## 2017-01-18 LAB — TSH: TSH: 3.96 u[IU]/mL (ref 0.450–4.500)

## 2017-01-18 NOTE — Telephone Encounter (Signed)
Patient has been informed and stated she will be here this morning.

## 2017-04-17 ENCOUNTER — Encounter: Payer: Self-pay | Admitting: Family

## 2017-04-17 ENCOUNTER — Ambulatory Visit (INDEPENDENT_AMBULATORY_CARE_PROVIDER_SITE_OTHER): Payer: Managed Care, Other (non HMO) | Admitting: Family

## 2017-04-17 VITALS — BP 146/84 | HR 111 | Temp 98.2°F | Ht 65.0 in | Wt 160.4 lb

## 2017-04-17 DIAGNOSIS — M7918 Myalgia, other site: Secondary | ICD-10-CM

## 2017-04-17 DIAGNOSIS — M791 Myalgia: Secondary | ICD-10-CM

## 2017-04-17 DIAGNOSIS — H6501 Acute serous otitis media, right ear: Secondary | ICD-10-CM

## 2017-04-17 MED ORDER — PREDNISONE 10 MG PO TABS: ORAL_TABLET | ORAL | 0 refills | Status: DC

## 2017-04-17 NOTE — Patient Instructions (Addendum)
Stop sudafed as has elevated your blood pressure- call me later today and let me know HR, BP.  Trial prednisone  Let me know if not better

## 2017-04-17 NOTE — Progress Notes (Signed)
Subjective:    Patient ID: Jill Michael, female    DOB: 28-Dec-1950, 66 y.o.   MRN: [id number]  CC: Jill Michael is a 66 y.o. female who presents today for an acute visit.    HPI: CC: right ear pain x one month, worsening pressure over past week.    Started to notice 'stuffiness' and post nasal drainage on right side.  No drainage from ear.   Has noticed that 'balance is a little off'. No falls, syncope.   On sudafed with relief.   Has seasonal allergies.   Elevated BP- has improved with reduction in caffeine and excercie. Denies exertional chest pain or pressure, numbness or tingling radiating to left arm or jaw, palpitations, dizziness, frequent headaches, changes in vision, or shortness of breath.      HISTORY:  Past Medical History:  Diagnosis Date  . Allergy    hay fever  . Arthritis   . Arthritis    right knee, followed by Jefm Bryant  . Chicken pox   . Low back pain    recurrent  . Scoliosis   . UTI (urinary tract infection)    Past Surgical History:  Procedure Laterality Date  . BREAST BIOPSY Right 2000 and 2005   fibroadenoma right breast x2  . NASAL SEPTUM SURGERY    . partial knee replacement Right 2013   Dr. Jefm Bryant  . semithyroidectomy of right side  08/2106  . TUBAL LIGATION    . VAGINAL DELIVERY     2   Family History  Problem Relation Age of Onset  . Cancer Mother        breast  . Breast cancer Mother 52  . Cancer Father        lung    Allergies: Penicillin v potassium and Penicillins Current Outpatient Prescriptions on File Prior to Visit  Medication Sig Dispense Refill  . amitriptyline (ELAVIL) 10 MG tablet TAKE 1-3 TABLETS BY MOUTH AT BEDTIME  0  . baclofen (LIORESAL) 10 MG tablet     . Calcium-Magnesium-Vitamin D (CALCIUM 500 PO) Take by mouth.    . Cholecalciferol (VITAMIN D) 2000 UNITS CAPS Take 1 capsule by mouth daily.    . cycloSPORINE (RESTASIS) 0.05 % ophthalmic emulsion Place 1 drop into both eyes 2 (two) times daily.  180 each 3  . estradiol (ESTRACE) 0.1 MG/GM vaginal cream 0.5 g intravaginally 1-3 times per week. 42.5 g 1  . flurandrenolide (CORDRAN) 0.05 % lotion Apply topically daily as needed. Do not use on face. 60 mL 1  . fluticasone (FLONASE) 50 MCG/ACT nasal spray Place 2 sprays into both nostrils daily. 48 g 3  . Hyaluronic Acid-Vitamin C (HYALURONIC ACID PO) Take 2 capsules by mouth daily.    Marland Kitchen levothyroxine (SYNTHROID, LEVOTHROID) 25 MCG tablet Take 25 mcg by mouth every morning.  11  . loratadine (CLARITIN) 10 MG tablet Take 10 mg by mouth daily.    . meloxicam (MOBIC) 15 MG tablet     . Multiple Vitamins-Minerals (ZINC PO) Take by mouth.    Marland Kitchen OVER THE COUNTER MEDICATION Take 3 capsules by mouth daily. Instaflex    . OVER THE COUNTER MEDICATION Take 1 capsule by mouth 2 (two) times daily. Tumeric     No current facility-administered medications on file prior to visit.     Social History  Substance Use Topics  . Smoking status: Never Smoker  . Smokeless tobacco: Never Used  . Alcohol use Yes     Comment:  occasional    Review of Systems  Constitutional: Negative for chills and fever.  HENT: Positive for ear pain and postnasal drip. Negative for ear discharge and sore throat.   Respiratory: Negative for cough.   Cardiovascular: Negative for chest pain and palpitations.  Gastrointestinal: Negative for nausea and vomiting.  Neurological: Negative for dizziness, syncope and light-headedness.      Objective:    BP (!) 146/84   Pulse (!) 111   Temp 98.2 F (36.8 C) (Oral)   Ht 5\' 5"  (1.651 m)   Wt 160 lb 6.4 oz (72.8 kg)   SpO2 97%   BMI 26.69 kg/m    Physical Exam  Constitutional: She appears well-developed and well-nourished.  HENT:  Head: Normocephalic and atraumatic.  Right Ear: Hearing, tympanic membrane, external ear and ear canal normal. No drainage, swelling or tenderness. No foreign bodies. Tympanic membrane is not erythematous and not bulging. No middle ear effusion.  No decreased hearing is noted.  Left Ear: Hearing, tympanic membrane, external ear and ear canal normal. No drainage, swelling or tenderness. No foreign bodies. Tympanic membrane is not erythematous and not bulging.  No middle ear effusion. No decreased hearing is noted.  Nose: Nose normal. No rhinorrhea. Right sinus exhibits no maxillary sinus tenderness and no frontal sinus tenderness. Left sinus exhibits no maxillary sinus tenderness and no frontal sinus tenderness.  Mouth/Throat: Uvula is midline, oropharynx is clear and moist and mucous membranes are normal. No oropharyngeal exudate, posterior oropharyngeal edema, posterior oropharyngeal erythema or tonsillar abscesses.  Eyes: Conjunctivae, EOM and lids are normal. Pupils are equal, round, and reactive to light. Lids are everted and swept, no foreign bodies found.  Normal fundus bilaterally   Cardiovascular: Normal rate, regular rhythm, normal heart sounds and normal pulses.   Pulmonary/Chest: Effort normal and breath sounds normal. She has no wheezes. She has no rhonchi. She has no rales.  Lymphadenopathy:       Head (right side): No submental, no submandibular, no tonsillar, no preauricular, no posterior auricular and no occipital adenopathy present.       Head (left side): No submental, no submandibular, no tonsillar, no preauricular, no posterior auricular and no occipital adenopathy present.    She has no cervical adenopathy.       Right cervical: No superficial cervical, no deep cervical and no posterior cervical adenopathy present.      Left cervical: No superficial cervical, no deep cervical and no posterior cervical adenopathy present.  Neurological: She is alert. She has normal strength. No cranial nerve deficit or sensory deficit. She displays a negative Romberg sign.  Reflex Scores:      Bicep reflexes are 2+ on the right side and 2+ on the left side.      Patellar reflexes are 2+ on the right side and 2+ on the left side. Grip  equal and strong bilateral upper extremities. Gait strong and steady. Able to perform  finger-to-nose without difficulty.   Skin: Skin is warm and dry.  Psychiatric: She has a normal mood and affect. Her speech is normal and behavior is normal. Thought content normal.  Vitals reviewed.      Assessment & Plan:  1. Myofascial pain dysfunction syndrome Improving with therapy, new referral as requested. - Ambulatory referral to Physical Therapy  2. Right acute serous otitis media, recurrence not specified Afebrile. Reassured by normal neurologic exam. Symptoms most consistent with serous otitis media. Trial of prednisone. Will let me know if note better  NOTE: HR  and BP elevated today. Stop sudafed. Patient will let me know today what her HR, BP is.   - predniSONE (DELTASONE) 10 MG tablet; Take 40 mg by mouth on day 1, then taper 10 mg daily until gone  Dispense: 10 tablet; Refill: 0     I am having Ms. Dearing maintain her loratadine, Vitamin D, OVER THE COUNTER MEDICATION, Hyaluronic Acid-Vitamin C (HYALURONIC ACID PO), OVER THE COUNTER MEDICATION, Multiple Vitamins-Minerals (ZINC PO), meloxicam, baclofen, Calcium-Magnesium-Vitamin D (CALCIUM 500 PO), levothyroxine, fluticasone, cycloSPORINE, estradiol, flurandrenolide, and amitriptyline.   No orders of the defined types were placed in this encounter.   Return precautions given.   Risks, benefits, and alternatives of the medications and treatment plan prescribed today were discussed, and patient expressed understanding.   Education regarding symptom management and diagnosis given to patient on AVS.  Continue to follow with Burnard Hawthorne, FNP for routine health maintenance.   Toneisha June Oyen and I agreed with plan.   Mable Paris, FNP

## 2017-04-17 NOTE — Progress Notes (Signed)
Pre visit review using our clinic review tool, if applicable. No additional management support is needed unless otherwise documented below in the visit note. 

## 2017-04-23 ENCOUNTER — Encounter: Payer: Self-pay | Admitting: Family

## 2017-04-24 ENCOUNTER — Other Ambulatory Visit: Payer: Self-pay | Admitting: Family

## 2017-04-24 DIAGNOSIS — H66002 Acute suppurative otitis media without spontaneous rupture of ear drum, left ear: Secondary | ICD-10-CM

## 2017-04-24 MED ORDER — CEFACLOR ER 500 MG PO TB12: 500.0000 mg | ORAL_TABLET | Freq: Two times a day (BID) | ORAL | 0 refills | Status: DC

## 2017-06-23 ENCOUNTER — Encounter: Payer: Self-pay | Admitting: Family

## 2017-06-23 DIAGNOSIS — Z0000 Encounter for general adult medical examination without abnormal findings: Secondary | ICD-10-CM

## 2017-06-23 MED ORDER — FLUTICASONE PROPIONATE 50 MCG/ACT NA SUSP: 2.0000 | Freq: Every day | NASAL | 3 refills | Status: DC

## 2017-06-23 NOTE — Telephone Encounter (Signed)
Order has been placed in provider box for signature.  Will be faxed once signed.

## 2017-06-28 ENCOUNTER — Encounter: Payer: Self-pay | Admitting: Family

## 2017-06-28 DIAGNOSIS — Z0000 Encounter for general adult medical examination without abnormal findings: Secondary | ICD-10-CM

## 2017-06-29 MED ORDER — FLUTICASONE PROPIONATE 50 MCG/ACT NA SUSP: 2.0000 | Freq: Every day | NASAL | 3 refills | Status: DC

## 2017-08-28 ENCOUNTER — Telehealth: Payer: Self-pay | Admitting: *Deleted

## 2017-08-28 DIAGNOSIS — Z13820 Encounter for screening for osteoporosis: Secondary | ICD-10-CM

## 2017-08-28 DIAGNOSIS — E041 Nontoxic single thyroid nodule: Secondary | ICD-10-CM

## 2017-08-28 NOTE — Telephone Encounter (Signed)
Please advise 

## 2017-08-28 NOTE — Telephone Encounter (Signed)
Pt has requested orders for her ultrasound to have her thyroid re-checked and a order for her bone density

## 2017-08-29 NOTE — Telephone Encounter (Signed)
Ordered  Let pt know

## 2017-08-29 NOTE — Telephone Encounter (Signed)
Patient has been informed.

## 2017-09-05 ENCOUNTER — Ambulatory Visit
Admission: RE | Admit: 2017-09-05 | Discharge: 2017-09-05 | Disposition: A | Discharge status: Home / Self Care | Payer: Medicare Other | Source: Ambulatory Visit | Attending: Family | Admitting: Family

## 2017-09-05 DIAGNOSIS — Z9889 Other specified postprocedural states: Secondary | ICD-10-CM | POA: Diagnosis not present

## 2017-09-05 DIAGNOSIS — E041 Nontoxic single thyroid nodule: Secondary | ICD-10-CM | POA: Diagnosis not present

## 2017-09-06 ENCOUNTER — Encounter: Payer: Self-pay | Admitting: Family

## 2017-10-02 ENCOUNTER — Ambulatory Visit
Admission: RE | Admit: 2017-10-02 | Discharge: 2017-10-02 | Disposition: A | Discharge status: Home / Self Care | Payer: Medicare Other | Source: Ambulatory Visit | Attending: Family | Admitting: Family

## 2017-10-02 DIAGNOSIS — M85852 Other specified disorders of bone density and structure, left thigh: Secondary | ICD-10-CM | POA: Diagnosis not present

## 2017-10-02 DIAGNOSIS — M8588 Other specified disorders of bone density and structure, other site: Secondary | ICD-10-CM | POA: Insufficient documentation

## 2017-10-02 DIAGNOSIS — Z780 Asymptomatic menopausal state: Secondary | ICD-10-CM | POA: Diagnosis not present

## 2017-10-02 DIAGNOSIS — Z13820 Encounter for screening for osteoporosis: Secondary | ICD-10-CM | POA: Insufficient documentation

## 2017-10-11 ENCOUNTER — Ambulatory Visit (INDEPENDENT_AMBULATORY_CARE_PROVIDER_SITE_OTHER): Payer: Medicare Other | Admitting: Family

## 2017-10-11 ENCOUNTER — Encounter: Payer: Self-pay | Admitting: Family

## 2017-10-11 VITALS — BP 124/80 | HR 105 | Temp 98.1°F | Ht 65.75 in | Wt 158.4 lb

## 2017-10-11 DIAGNOSIS — Z1231 Encounter for screening mammogram for malignant neoplasm of breast: Secondary | ICD-10-CM

## 2017-10-11 DIAGNOSIS — M8580 Other specified disorders of bone density and structure, unspecified site: Secondary | ICD-10-CM | POA: Diagnosis not present

## 2017-10-11 DIAGNOSIS — Z0130 Encounter for examination of blood pressure without abnormal findings: Secondary | ICD-10-CM | POA: Diagnosis not present

## 2017-10-11 DIAGNOSIS — E041 Nontoxic single thyroid nodule: Secondary | ICD-10-CM | POA: Diagnosis not present

## 2017-10-11 DIAGNOSIS — Z136 Encounter for screening for cardiovascular disorders: Secondary | ICD-10-CM

## 2017-10-11 DIAGNOSIS — E559 Vitamin D deficiency, unspecified: Secondary | ICD-10-CM

## 2017-10-11 DIAGNOSIS — Z1239 Encounter for other screening for malignant neoplasm of breast: Secondary | ICD-10-CM

## 2017-10-11 MED ORDER — LEVOTHYROXINE SODIUM 25 MCG PO TABS: 25.0000 ug | ORAL_TABLET | Freq: Every morning | ORAL | 11 refills | Status: DC

## 2017-10-11 NOTE — Progress Notes (Signed)
Subjective:    Patient ID: Jill Michael, female    DOB: 05-10-1951, 66 y.o.   MRN: [id number]  CC: Jill Michael is a 66 y.o. female who presents today for follow up.   HPI: Concerned for bone density results.   Started walking program walking three days per week for 10 minutes. H/o chronic low back pain, scoliosis.  She would like to be more proactive with osteopenia. On calcium and vitamin D.   Has had radiofrequency ablation and steroid injection into the sacrum this past year.   She suspect blood pressures elevated due to anxiety. She's been quite worried about osteopenia and this appointment today. .Denies exertional chest pain or pressure, numbness or tingling radiating to left arm or jaw, palpitations, dizziness, frequent headaches, changes in vision, or shortness of breath.    Would like fasting labs done today.     HISTORY:  Past Medical History:  Diagnosis Date  . Allergy    hay fever  . Arthritis   . Arthritis    right knee, followed by Jefm Bryant  . Chicken pox   . Low back pain    recurrent  . Scoliosis   . UTI (urinary tract infection)    Past Surgical History:  Procedure Laterality Date  . BREAST BIOPSY Right 2000 and 2005   fibroadenoma right breast x2  . NASAL SEPTUM SURGERY    . partial knee replacement Right 2013   Dr. Jefm Bryant  . semithyroidectomy of right side  08/2106  . TUBAL LIGATION    . VAGINAL DELIVERY     2   Family History  Problem Relation Age of Onset  . Cancer Mother        breast  . Breast cancer Mother 64  . Cancer Father        lung    Allergies: Penicillin v potassium and Penicillins Current Outpatient Medications on File Prior to Visit  Medication Sig Dispense Refill  . Calcium-Magnesium-Vitamin D (CALCIUM 500 PO) Take by mouth.    . cefaclor (CECLOR CD) 500 MG 12 hr tablet Take 1 tablet (500 mg total) by mouth 2 (two) times daily. 14 tablet 0  . Cholecalciferol (VITAMIN D) 2000 UNITS CAPS Take 1 capsule by mouth  daily.    . cycloSPORINE (RESTASIS) 0.05 % ophthalmic emulsion Place 1 drop into both eyes 2 (two) times daily. 180 each 3  . estradiol (ESTRACE) 0.1 MG/GM vaginal cream 0.5 g intravaginally 1-3 times per week. 42.5 g 1  . fexofenadine (ALLEGRA) 30 MG tablet Take 30 mg 2 (two) times daily by mouth.    . flurandrenolide (CORDRAN) 0.05 % lotion Apply topically daily as needed. Do not use on face. 60 mL 1  . fluticasone (FLONASE) 50 MCG/ACT nasal spray Place 2 sprays into both nostrils daily. 16 g 3  . Hyaluronic Acid-Vitamin C (HYALURONIC ACID PO) Take 2 capsules by mouth daily.    . meloxicam (MOBIC) 15 MG tablet     . Multiple Vitamins-Minerals (ZINC PO) Take by mouth.    Marland Kitchen OVER THE COUNTER MEDICATION Take 3 capsules by mouth daily. Instaflex    . OVER THE COUNTER MEDICATION Take 1 capsule by mouth 2 (two) times daily. Tumeric    . predniSONE (DELTASONE) 10 MG tablet Take 40 mg by mouth on day 1, then taper 10 mg daily until gone 10 tablet 0   No current facility-administered medications on file prior to visit.     Social History   Tobacco  Use  . Smoking status: Never Smoker  . Smokeless tobacco: Never Used  Substance Use Topics  . Alcohol use: Yes    Comment: occasional  . Drug use: Yes    Review of Systems  Constitutional: Negative for chills and fever.  Respiratory: Negative for cough.   Cardiovascular: Negative for chest pain and palpitations.  Gastrointestinal: Negative for nausea and vomiting.      Objective:    BP 124/80   Pulse (!) 105   Temp 98.1 F (36.7 C) (Oral)   Ht 5' 5.75" (1.67 m)   Wt 158 lb 6.4 oz (71.8 kg)   SpO2 96%   BMI 25.76 kg/m  BP Readings from Last 3 Encounters:  10/11/17 124/80  04/17/17 (!) 146/84  01/10/17 130/80   Wt Readings from Last 3 Encounters:  10/11/17 158 lb 6.4 oz (71.8 kg)  04/17/17 160 lb 6.4 oz (72.8 kg)  01/10/17 160 lb 6.4 oz (72.8 kg)    Physical Exam  Constitutional: She appears well-developed and well-nourished.    Eyes: Conjunctivae are normal.  Cardiovascular: Normal rate, regular rhythm, normal heart sounds and normal pulses.  Pulmonary/Chest: Effort normal and breath sounds normal. She has no wheezes. She has no rhonchi. She has no rales.  Neurological: She is alert.  Skin: Skin is warm and dry.  Psychiatric: She has a normal mood and affect. Her speech is normal and behavior is normal. Thought content normal.  Vitals reviewed.      Assessment & Plan:   Problem List Items Addressed This Visit      Endocrine   Thyroid nodule    Asymptomatic. Pending thyroid studies      Relevant Medications   levothyroxine (SYNTHROID, LEVOTHROID) 25 MCG tablet   Other Relevant Orders   T3, free   T4, free   TSH     Musculoskeletal and Integument   Osteopenia - Primary    Long discussion with patient about worsening of osteopenia noted of the spine. At this time, she does not meet guidelines based on FRAX score for treatment. However she's had long history of scoliosis, steroid use, we jointly agreed it was appropriate to consult for second opinion regarding appropriateness of starting a medication such as prolia Referral placed to rheumatology. We'll follow      Relevant Orders   Ambulatory referral to Rheumatology   VITAMIN D 25 Hydroxy (Vit-D Deficiency, Fractures)     Other   Blood pressure check    Slightly elevated today however decreasing. Patient and I jointly agreed likely anxiety in doctors offices contributory. Patient will monitor at home      Relevant Orders   CBC with Differential/Platelet   Comprehensive metabolic panel    Other Visit Diagnoses    Screening for breast cancer       Relevant Orders   MM SCREENING BREAST TOMO BILATERAL   Screening for cardiovascular condition       Relevant Orders   Lipid panel   Vitamin D deficiency       Relevant Orders   VITAMIN D 25 Hydroxy (Vit-D Deficiency, Fractures)       I have discontinued [name]. [name]'s loratadine,  baclofen, and amitriptyline. I have also changed her levothyroxine. Additionally, I am having her maintain her Vitamin D, OVER THE COUNTER MEDICATION, Hyaluronic Acid-Vitamin C (HYALURONIC ACID PO), OVER THE COUNTER MEDICATION, Multiple Vitamins-Minerals (ZINC PO), meloxicam, Calcium-Magnesium-Vitamin D (CALCIUM 500 PO), cycloSPORINE, estradiol, flurandrenolide, predniSONE, cefaclor, fluticasone, and fexofenadine.   Meds ordered this  encounter  Medications  . fexofenadine (ALLEGRA) 30 MG tablet    Sig: Take 30 mg 2 (two) times daily by mouth.  . levothyroxine (SYNTHROID, LEVOTHROID) 25 MCG tablet    Sig: Take 1 tablet (25 mcg total) every morning by mouth.    Dispense:  30 tablet    Refill:  11    Order Specific Question:   Supervising Provider    Answer:   Crecencio Mc [[id number]]    Return precautions given.   Risks, benefits, and alternatives of the medications and treatment plan prescribed today were discussed, and patient expressed understanding.   Education regarding symptom management and diagnosis given to patient on AVS.  Continue to follow with Burnard Hawthorne, FNP for routine health maintenance.   Kennidy June Hakanson and I agreed with plan.   Mable Paris, FNP

## 2017-10-11 NOTE — Patient Instructions (Signed)
Fasting labs  We placed a referral for mammogram this year. I asked that you call one the below locations and schedule this when it is convenient for you.   As discussed, I would like you to ask for 3D mammogram over the traditional 2D mammogram as new evidence suggest 3D is superior.   Please note that NOT all insurance companies cover 3D and you may have to pay a higher copay. You may call your insurance company to further clarify your benefits.   Options for [place]  [place], [place]  * [facility] [place], [place] * Note if you ask for 3D mammogram at this location, you must request [place], [place] location*

## 2017-10-11 NOTE — Assessment & Plan Note (Signed)
Long discussion with patient about worsening of osteopenia noted of the spine. At this time, she does not meet guidelines based on FRAX score for treatment. However she's had long history of scoliosis, steroid use, we jointly agreed it was appropriate to consult for second opinion regarding appropriateness of starting a medication such as prolia Referral placed to rheumatology. We'll follow

## 2017-10-11 NOTE — Assessment & Plan Note (Signed)
Asymptomatic. Pending thyroid studies

## 2017-10-11 NOTE — Progress Notes (Signed)
Pre visit review using our clinic review tool, if applicable. No additional management support is needed unless otherwise documented below in the visit note. 

## 2017-10-11 NOTE — Assessment & Plan Note (Signed)
Slightly elevated today however decreasing. Patient and I jointly agreed likely anxiety in doctors offices contributory. Patient will monitor at home

## 2017-10-12 ENCOUNTER — Other Ambulatory Visit (INDEPENDENT_AMBULATORY_CARE_PROVIDER_SITE_OTHER): Payer: Medicare Other

## 2017-10-12 DIAGNOSIS — Z136 Encounter for screening for cardiovascular disorders: Secondary | ICD-10-CM

## 2017-10-12 DIAGNOSIS — E041 Nontoxic single thyroid nodule: Secondary | ICD-10-CM | POA: Diagnosis not present

## 2017-10-12 DIAGNOSIS — E559 Vitamin D deficiency, unspecified: Secondary | ICD-10-CM | POA: Diagnosis not present

## 2017-10-12 DIAGNOSIS — M8580 Other specified disorders of bone density and structure, unspecified site: Secondary | ICD-10-CM

## 2017-10-12 DIAGNOSIS — Z0130 Encounter for examination of blood pressure without abnormal findings: Secondary | ICD-10-CM | POA: Diagnosis not present

## 2017-10-12 LAB — COMPREHENSIVE METABOLIC PANEL
ALT: 19 U/L (ref 0–35)
AST: 19 U/L (ref 0–37)
Albumin: 4 g/dL (ref 3.5–5.2)
Alkaline Phosphatase: 36 U/L — ABNORMAL LOW (ref 39–117)
BUN: 18 mg/dL (ref 6–23)
CHLORIDE: 102 meq/L (ref 96–112)
CO2: 29 mEq/L (ref 19–32)
Calcium: 9.4 mg/dL (ref 8.4–10.5)
Creatinine, Ser: 0.72 mg/dL (ref 0.40–1.20)
GFR: 86.3 mL/min (ref >60.00)
GLUCOSE: 98 mg/dL (ref 70–99)
POTASSIUM: 4.3 meq/L (ref 3.5–5.1)
SODIUM: 136 meq/L (ref 135–145)
Total Bilirubin: 0.7 mg/dL (ref 0.2–1.2)
Total Protein: 6.5 g/dL (ref 6.0–8.3)

## 2017-10-12 LAB — CBC WITH DIFFERENTIAL/PLATELET
BASOS PCT: 1.3 % (ref 0.0–3.0)
Basophils Absolute: 0.1 10*3/uL (ref 0.0–0.1)
EOS PCT: 2 % (ref 0.0–5.0)
Eosinophils Absolute: 0.1 10*3/uL (ref 0.0–0.7)
HCT: 41.9 % (ref 36.0–46.0)
HEMOGLOBIN: 14.3 g/dL (ref 12.0–15.0)
LYMPHS ABS: 1.8 10*3/uL (ref 0.7–4.0)
Lymphocytes Relative: 30.7 % (ref 12.0–46.0)
MCHC: 34.2 g/dL (ref 30.0–36.0)
MCV: 94 fl (ref 78.0–100.0)
MONO ABS: 0.6 10*3/uL (ref 0.1–1.0)
MONOS PCT: 9.4 % (ref 3.0–12.0)
Neutro Abs: 3.3 10*3/uL (ref 1.4–7.7)
Neutrophils Relative %: 56.6 % (ref 43.0–77.0)
Platelets: 255 10*3/uL (ref 150.0–400.0)
RBC: 4.45 Mil/uL (ref 3.87–5.11)
RDW: 12 % (ref 11.5–15.5)
WBC: 5.9 10*3/uL (ref 4.0–10.5)

## 2017-10-12 LAB — T4, FREE: Free T4: 0.98 ng/dL (ref 0.60–1.60)

## 2017-10-12 LAB — TSH: TSH: 4.05 u[IU]/mL (ref 0.35–4.50)

## 2017-10-12 LAB — LIPID PANEL
CHOL/HDL RATIO: 4
Cholesterol: 176 mg/dL (ref 0–200)
HDL: 47.3 mg/dL (ref >39.00)
LDL CALC: 115 mg/dL — AB (ref 0–99)
NONHDL: 128.8
Triglycerides: 67 mg/dL (ref 0.0–149.0)
VLDL: 13.4 mg/dL (ref 0.0–40.0)

## 2017-10-12 LAB — VITAMIN D 25 HYDROXY (VIT D DEFICIENCY, FRACTURES): VITD: 44.87 ng/mL (ref 30.00–100.00)

## 2017-10-12 LAB — T3, FREE: T3, Free: 3.3 pg/mL (ref 2.3–4.2)

## 2017-11-15 ENCOUNTER — Encounter: Payer: Medicare Other | Admitting: Internal Medicine

## 2017-11-15 ENCOUNTER — Encounter: Payer: Managed Care, Other (non HMO) | Admitting: Family

## 2017-11-17 ENCOUNTER — Ambulatory Visit (INDEPENDENT_AMBULATORY_CARE_PROVIDER_SITE_OTHER): Payer: Medicare Other | Admitting: Internal Medicine

## 2017-11-17 ENCOUNTER — Other Ambulatory Visit (HOSPITAL_COMMUNITY)
Admission: RE | Admit: 2017-11-17 | Discharge: 2017-11-17 | Disposition: A | Discharge status: Home / Self Care | Payer: Medicare Other | Source: Ambulatory Visit | Attending: Internal Medicine | Admitting: Internal Medicine

## 2017-11-17 ENCOUNTER — Encounter: Payer: Self-pay | Admitting: Internal Medicine

## 2017-11-17 VITALS — BP 140/88 | HR 111 | Temp 98.4°F | Ht 65.75 in | Wt 159.2 lb

## 2017-11-17 DIAGNOSIS — Z01419 Encounter for gynecological examination (general) (routine) without abnormal findings: Secondary | ICD-10-CM | POA: Diagnosis not present

## 2017-11-17 DIAGNOSIS — M8588 Other specified disorders of bone density and structure, other site: Secondary | ICD-10-CM | POA: Diagnosis not present

## 2017-11-17 DIAGNOSIS — Z124 Encounter for screening for malignant neoplasm of cervix: Secondary | ICD-10-CM | POA: Insufficient documentation

## 2017-11-17 DIAGNOSIS — I10 Essential (primary) hypertension: Secondary | ICD-10-CM | POA: Insufficient documentation

## 2017-11-17 DIAGNOSIS — Z23 Encounter for immunization: Secondary | ICD-10-CM

## 2017-11-17 DIAGNOSIS — R030 Elevated blood-pressure reading, without diagnosis of hypertension: Secondary | ICD-10-CM | POA: Diagnosis not present

## 2017-11-17 DIAGNOSIS — F419 Anxiety disorder, unspecified: Secondary | ICD-10-CM | POA: Diagnosis not present

## 2017-11-17 DIAGNOSIS — Z0000 Encounter for general adult medical examination without abnormal findings: Secondary | ICD-10-CM

## 2017-11-17 NOTE — Patient Instructions (Addendum)
Follow up in 3-6 months sooner if needed  Will refer to Endocrine to discuss osteopenia  We gave you prevnar today  Atrophic Vaginitis Atrophic vaginitis is a condition in which the tissues that line the vagina become dry and thin. This condition is most common in women who have stopped having regular menstrual periods (menopause). This usually starts when a woman is 65-65 years old. Estrogen helps to keep the vagina moist. It stimulates the vagina to produce a clear fluid that lubricates the vagina for sexual intercourse. This fluid also protects the vagina from infection. Lack of estrogen can cause the lining of the vagina to get thinner and dryer. The vagina may also shrink in size. It may become less elastic. Atrophic vaginitis tends to get worse over time as a woman's estrogen level drops. What are the causes? This condition is caused by the normal drop in estrogen that happens around the time of menopause. What increases the risk? Certain conditions or situations may lower a woman's estrogen level, which increases her risk of atrophic vaginitis. These include:  Taking medicine that blocks estrogen.  Having ovaries removed surgically.  Being treated for cancer with X-ray treatment (radiation) or medicines (chemotherapy).  Exercising very hard and often.  Having an eating disorder (anorexia).  Giving birth or breastfeeding.  Being over the age of 35.  Smoking.  What are the signs or symptoms? Symptoms of this condition include:  Pain, soreness, or bleeding during sexual intercourse (dyspareunia).  Vaginal burning, irritation, or itching.  Pain or bleeding during a vaginal examination using a speculum (pelvic exam).  Loss of interest in sexual activity.  Having burning pain when passing urine.  Vaginal discharge that is brown or yellow.  In some cases, there are no symptoms. How is this diagnosed? This condition is diagnosed with a medical history and physical exam.  This will include a pelvic exam that checks whether the inside of your vagina appears pale, thin, or dry. Rarely, you may also have other tests, including:  A urine test.  A test that checks the acid balance in your vaginal fluid (acid balance test).  How is this treated? Treatment for this condition may depend on the severity of your symptoms. Treatment may include:  Using an over-the-counter vaginal lubricant before you have sexual intercourse.  Using a long-acting vaginal moisturizer.  Using low-dose vaginal estrogen for moderate to severe symptoms that do not respond to other treatments. Options include creams, tablets, and inserts (vaginal rings). Before using vaginal estrogen, tell your health care provider if you have a history of: ? Breast cancer. ? Endometrial cancer. ? Blood clots.  Taking medicines. You may be able to take a daily pill for dyspareunia. Discuss all of the risks of this medicine with your health care provider. It is usually not recommended for women who have a family history or personal history of breast cancer.  If your symptoms are very mild and you are not sexually active, you may not need treatment. Follow these instructions at home:  Take medicines only as directed by your health care provider. Do not use herbal or alternative medicines unless your health care provider says that you can.  Use over-the-counter creams, lubricants, or moisturizers for dryness only as directed by your health care provider.  If your atrophic vaginitis is caused by menopause, discuss all of your menopausal symptoms and treatment options with your health care provider.  Do not douche.  Do not use products that can make your vagina dry.  These include: ? Scented feminine sprays. ? Scented tampons. ? Scented soaps.  If it hurts to have sex, talk with your sexual partner. Contact a health care provider if:  Your discharge looks different than normal.  Your vagina has an  unusual smell.  You have new symptoms.  Your symptoms do not improve with treatment.  Your symptoms get worse. This information is not intended to replace advice given to you by your health care provider. Make sure you discuss any questions you have with your health care provider. Document Released: 04/07/2015 Document Revised: 04/28/2016 Document Reviewed: 11/12/2014 Elsevier Interactive Patient Education  [employer].

## 2017-11-17 NOTE — Progress Notes (Signed)
Chief Complaint  Patient presents with  . Annual Exam   Pt presents for pap and wellness  1. BP elevations at the MD visits 140/88 and another provider visit sbp was 150s she is not on medications ed that medications like mobic may elevate BP and also anxiety. Currently not on BP meds  2. Pt c/w osteopenia on DEXA and worsening T score now -2.3 and disc'ed prev with PCP about medications to treat and to prevent from progressing. Pt is concerned as she has chronic back pain and this is where T score is declining.  She reports she is unable to exercise more than 10 min increments due to back pain/jt pain but will try 2-10 minute increments at different times. She is on disability due to back pain and had L spine ablation to help 04/2017. She is on calcium and vitamin D but is considering prolia. She denies h/o bone fracture. She wants to disc with Endocrine will refer though reviewed with pt typically Rx vitamin D/Ca and rec wt bearing exercises. She reports insurance will cover repeat DEXA in 2 years.  3. Since right thyroid surgery she reports BP has been spiking esp with health related visits, she has increased situational anxiety, and GERD but she if currently off PPI    Review of Systems  HENT: Negative for hearing loss.   Eyes:       No vision changes   Respiratory: Negative for shortness of breath.   Cardiovascular: Negative for chest pain.  Gastrointestinal: Positive for heartburn.  Genitourinary:       +vaginal atrophy   Musculoskeletal: Positive for back pain and joint pain.  Skin: Negative for rash.  Psychiatric/Behavioral: The patient is nervous/anxious.    Past Medical History:  Diagnosis Date  . Allergy    hay fever  . Arthritis   . Arthritis    right knee, followed by Jefm Bryant  . Chicken pox   . Low back pain    recurrent  . Scoliosis   . UTI (urinary tract infection)    Past Surgical History:  Procedure Laterality Date  . BREAST BIOPSY Right 2000 and 2005   fibroadenoma right breast x2  . NASAL SEPTUM SURGERY    . partial knee replacement Right 2013   Dr. Jefm Bryant  . semithyroidectomy of right side  08/2106  . TUBAL LIGATION    . VAGINAL DELIVERY     2   Family History  Problem Relation Age of Onset  . Cancer Mother        breast  . Breast cancer Mother 62  . Cancer Father        lung   Social History   Socioeconomic History  . Marital status: Married    Spouse name: Not on file  . Number of children: Not on file  . Years of education: Not on file  . Highest education level: Not on file  Social Needs  . Financial resource strain: Not on file  . Food insecurity - worry: Not on file  . Food insecurity - inability: Not on file  . Transportation needs - medical: Not on file  . Transportation needs - non-medical: Not on file  Occupational History  . Not on file  Tobacco Use  . Smoking status: Never Smoker  . Smokeless tobacco: Never Used  Substance and Sexual Activity  . Alcohol use: Yes    Comment: occasional  . Drug use: Yes  . Sexual activity: Not on file  Other Topics  Concern  . Not on file  Social History Narrative   Lives in [place]. Moved from [place] in [employer], [employer]. Plans to retire 09/25/16. Currently appealing rejection for LT disability (12/2016).          Diet - regular, healthy; swims 4-5x per week   Exercise - none, walked previously   Current Meds  Medication Sig  . Calcium-Magnesium-Vitamin D (CALCIUM 500 PO) Take by mouth.  . Cholecalciferol (VITAMIN D) 2000 UNITS CAPS Take 1 capsule by mouth daily.  . cycloSPORINE (RESTASIS) 0.05 % ophthalmic emulsion Place 1 drop into both eyes 2 (two) times daily.  Marland Kitchen estradiol (ESTRACE) 0.1 MG/GM vaginal cream 0.5 g intravaginally 1-3 times per week.  . fexofenadine (ALLEGRA) 30 MG tablet Take 30 mg 2 (two) times daily by mouth.  . flurandrenolide (CORDRAN) 0.05 % lotion Apply topically daily as needed. Do not use on face.  . fluticasone  (FLONASE) 50 MCG/ACT nasal spray Place 2 sprays into both nostrils daily.  Marland Kitchen Hyaluronic Acid-Vitamin C (HYALURONIC ACID PO) Take 2 capsules by mouth daily.  Marland Kitchen levothyroxine (SYNTHROID, LEVOTHROID) 25 MCG tablet Take 1 tablet (25 mcg total) every morning by mouth.  . meloxicam (MOBIC) 15 MG tablet   . OVER THE COUNTER MEDICATION Take 3 capsules by mouth daily. Instaflex  . OVER THE COUNTER MEDICATION Take 1 capsule by mouth 2 (two) times daily. Tumeric   Allergies  Allergen Reactions  . Penicillin V Potassium Rash  . Penicillins Hives and Rash   Recent Results (from the past 2160 hour(s))  VITAMIN D 25 Hydroxy (Vit-D Deficiency, Fractures)     Status: None   Collection Time: 10/12/17  8:05 AM  Result Value Ref Range   VITD 44.87 30.00 - 100.00 ng/mL  TSH     Status: None   Collection Time: 10/12/17  8:05 AM  Result Value Ref Range   TSH 4.05 0.35 - 4.50 uIU/mL  T4, free     Status: None   Collection Time: 10/12/17  8:05 AM  Result Value Ref Range   Free T4 0.98 0.60 - 1.60 ng/dL    Comment: Specimens from patients who are undergoing biotin therapy and /or ingesting biotin supplements may contain high levels of biotin.  The higher biotin concentration in these specimens interferes with this Free T4 assay.  Specimens that contain high levels  of biotin may cause false high results for this Free T4 assay.  Please interpret results in light of the total clinical presentation of the patient.    T3, free     Status: None   Collection Time: 10/12/17  8:05 AM  Result Value Ref Range   T3, Free 3.3 2.3 - 4.2 pg/mL  Lipid panel     Status: Abnormal   Collection Time: 10/12/17  8:05 AM  Result Value Ref Range   Cholesterol 176 0 - 200 mg/dL    Comment: ATP III Classification       Desirable:  < 200 mg/dL               Borderline High:  200 - 239 mg/dL          High:  > = 240 mg/dL   Triglycerides 67.0 0.0 - 149.0 mg/dL    Comment: Normal:  <150 mg/dLBorderline High:  150 - 199 mg/dL   HDL  47.30 >39.00 mg/dL   VLDL 13.4 0.0 - 40.0 mg/dL   LDL Cholesterol 115 (H) 0 -  99 mg/dL   Total CHOL/HDL Ratio 4     Comment:                Men          Women1/2 Average Risk     3.4          3.3Average Risk          5.0          4.42X Average Risk          9.6          7.13X Average Risk          15.0          11.0                       NonHDL 128.80     Comment: NOTE:  Non-HDL goal should be 30 mg/dL higher than patient's LDL goal (i.e. LDL goal of < 70 mg/dL, would have non-HDL goal of < 100 mg/dL)  Comprehensive metabolic panel     Status: Abnormal   Collection Time: 10/12/17  8:05 AM  Result Value Ref Range   Sodium 136 135 - 145 mEq/L   Potassium 4.3 3.5 - 5.1 mEq/L   Chloride 102 96 - 112 mEq/L   CO2 29 19 - 32 mEq/L   Glucose, Bld 98 70 - 99 mg/dL   BUN 18 6 - 23 mg/dL   Creatinine, Ser 0.72 0.40 - 1.20 mg/dL   Total Bilirubin 0.7 0.2 - 1.2 mg/dL   Alkaline Phosphatase 36 (L) 39 - 117 U/L   AST 19 0 - 37 U/L   ALT 19 0 - 35 U/L   Total Protein 6.5 6.0 - 8.3 g/dL   Albumin 4.0 3.5 - 5.2 g/dL   Calcium 9.4 8.4 - 10.5 mg/dL   GFR 86.30 >60.00 mL/min  CBC with Differential/Platelet     Status: None   Collection Time: 10/12/17  8:05 AM  Result Value Ref Range   WBC 5.9 4.0 - 10.5 K/uL   RBC 4.45 3.87 - 5.11 Mil/uL   Hemoglobin 14.3 12.0 - 15.0 g/dL   HCT 41.9 36.0 - 46.0 %   MCV 94.0 78.0 - 100.0 fl   MCHC 34.2 30.0 - 36.0 g/dL   RDW 12.0 11.5 - 15.5 %   Platelets 255.0 150.0 - 400.0 K/uL   Neutrophils Relative % 56.6 43.0 - 77.0 %   Lymphocytes Relative 30.7 12.0 - 46.0 %   Monocytes Relative 9.4 3.0 - 12.0 %   Eosinophils Relative 2.0 0.0 - 5.0 %   Basophils Relative 1.3 0.0 - 3.0 %   Neutro Abs 3.3 1.4 - 7.7 K/uL   Lymphs Abs 1.8 0.7 - 4.0 K/uL   Monocytes Absolute 0.6 0.1 - 1.0 K/uL   Eosinophils Absolute 0.1 0.0 - 0.7 K/uL   Basophils Absolute 0.1 0.0 - 0.1 K/uL   Objective  Body mass index is 25.9 kg/m. Wt Readings from Last 3 Encounters:  11/17/17 159 lb 4  oz (72.2 kg)  10/11/17 158 lb 6.4 oz (71.8 kg)  04/17/17 160 lb 6.4 oz (72.8 kg)   Temp Readings from Last 3 Encounters:  11/17/17 98.4 F (36.9 C) (Oral)  10/11/17 98.1 F (36.7 C) (Oral)  04/17/17 98.2 F (36.8 C) (Oral)   BP Readings from Last 3 Encounters:  11/17/17 140/88  10/11/17 124/80  04/17/17 (!) 146/84   Pulse Readings from Last 3 Encounters:  11/17/17 (!) 111  10/11/17 (!) 105  04/17/17 (!) 111   O2 99% room air   Physical Exam  Constitutional: She is oriented to person, place, and time and well-developed, well-nourished, and in no distress.  HENT:  Head: Normocephalic and atraumatic.  Mouth/Throat: Oropharynx is clear and moist and mucous membranes are normal.  Eyes: Conjunctivae are normal. Pupils are equal, round, and reactive to light.  Cardiovascular: Regular rhythm and normal heart sounds. Tachycardia present.  No murmur heard. Neg leg edema b/l  Pulmonary/Chest: Effort normal and breath sounds normal. Right breast exhibits no inverted nipple, no mass, no nipple discharge, no skin change and no tenderness. Left breast exhibits no inverted nipple, no mass, no nipple discharge, no skin change and no tenderness. Breasts are asymmetrical.  Chronic left breast larger than right   Abdominal: Soft. Bowel sounds are normal. There is no tenderness.  Genitourinary: Vagina normal, uterus normal, cervix normal, right adnexa normal, left adnexa normal and vulva normal. No vaginal discharge found.  Genitourinary Comments: Mild bleeding after specimans obtained per pt h/o vaginal atrophy   Neurological: She is alert and oriented to person, place, and time. Gait normal.  Skin: Skin is warm, dry and intact.  Psychiatric: Mood, memory, affect and judgment normal.  Nursing note and vitals reviewed.    Assessment   1. Wellness exam  2. H/o osteopenia DEXA 10/02/17 with worsening T score -2.3. vitamin D3 01/17/17 normal  3. Anxiety with coming to MD visit  4. Elevated BP  esp with MD visits  5. HM   Plan  1.  Breast exam and pap today  Will return to do UA  2.  Reviewed vit D on 2000 IU, calcium rec 600 mg bid  Per pt insurance will cover repeat DEXA in 2 years 10/02/2019 Pt wants consult with Endocrine to disc ostepenia and tx and she has heard about prolia   3. Monitor  4.  Pt to log BP at home and RTC with readings  5.  Had flu 08/24/17  Given prevnar today will need pna 23 in 1 year  Disc shingrix vaccine. Had zostavax prev.  UTD Tdap   Breast exam and pap today asymmetry left breast larger than right but not new.   Mammogram 12/23/16 neg has appt sch 12/25/17   colonoscpy had 02/06/15 normal though GI rec f/u in 5 years.   F/u Derm with Dr. Nicole Kindred   DEXA due at least by 09/2020 per pt insurance will cover in 2020    Provider: Dr. Olivia Mackie McLean-Scocuzza-[facility]

## 2017-11-21 LAB — CYTOLOGY - PAP
DIAGNOSIS: NEGATIVE
HPV (WINDOPATH): NOT DETECTED

## 2017-11-23 ENCOUNTER — Other Ambulatory Visit (INDEPENDENT_AMBULATORY_CARE_PROVIDER_SITE_OTHER): Payer: Medicare Other

## 2017-11-23 ENCOUNTER — Encounter: Payer: Self-pay | Admitting: Internal Medicine

## 2017-11-23 DIAGNOSIS — Z0000 Encounter for general adult medical examination without abnormal findings: Secondary | ICD-10-CM | POA: Diagnosis not present

## 2017-11-23 DIAGNOSIS — Z124 Encounter for screening for malignant neoplasm of cervix: Secondary | ICD-10-CM | POA: Diagnosis not present

## 2017-11-23 LAB — URINALYSIS, ROUTINE W REFLEX MICROSCOPIC
Bilirubin Urine: NEGATIVE
HGB URINE DIPSTICK: NEGATIVE
Ketones, ur: NEGATIVE
Leukocytes, UA: NEGATIVE
NITRITE: NEGATIVE
RBC / HPF: NONE SEEN (ref 0–?)
Specific Gravity, Urine: <=1.005 — AB (ref 1.000–1.030)
Total Protein, Urine: NEGATIVE
URINE GLUCOSE: NEGATIVE
Urobilinogen, UA: 0.2 (ref 0.0–1.0)
WBC UA: NONE SEEN (ref 0–?)
pH: 6.5 (ref 5.0–8.0)

## 2017-12-07 ENCOUNTER — Telehealth: Payer: Self-pay

## 2017-12-07 NOTE — Telephone Encounter (Addendum)
Patient advised of result and verbalized an understanding. She has  previously used vagifem prescribed by Dr Gilford Rile .  Now using premarin cream which was expired. 10/18 and using prn when burning occurred.  Then was prescribed estradiol by Joycelyn Schmid and never used and she just started using now , and when she started using and it would be burn vaginal and felt cramping in uterus.   After pap smear felt camping in uterus for over week.   She has done second dose of estradiol cream some cramping and burning.  No vaginal burning, no dysuria.  Please advise.  Appointment scheduled 12/14/17

## 2017-12-07 NOTE — Telephone Encounter (Signed)
I would not rec using any expired medication Vagifem and Estradiol are same ingredients but different doses  Would hold off on using any of these creams for now These creams do have side effects of vaginal burning, abdominal pain/cramps   I like non hormonal therapy over the counter one name is Replens to restore vaginal moisture if that is the issue after menopause  What kind of soap is she using in the private area?  We can address at f/u but hold on vaginal creams for now

## 2017-12-07 NOTE — Telephone Encounter (Signed)
-----   Message from [name], MD sent at 12/07/2017  7:54 AM EST ----- Pt did not read my chart pap results negative   Jill Michael

## 2017-12-08 NOTE — Telephone Encounter (Signed)
Patient advised of result and verbalized an understanding. She states she will hold off on using vaginal creams until appointment. She uses [employer].

## 2017-12-14 ENCOUNTER — Encounter: Payer: Self-pay | Admitting: Family

## 2017-12-14 ENCOUNTER — Ambulatory Visit (INDEPENDENT_AMBULATORY_CARE_PROVIDER_SITE_OTHER): Payer: Medicare Other | Admitting: Internal Medicine

## 2017-12-14 ENCOUNTER — Encounter: Payer: Self-pay | Admitting: Internal Medicine

## 2017-12-14 VITALS — BP 150/84 | HR 100 | Temp 98.1°F | Ht 60.75 in | Wt 160.2 lb

## 2017-12-14 DIAGNOSIS — N952 Postmenopausal atrophic vaginitis: Secondary | ICD-10-CM

## 2017-12-14 DIAGNOSIS — R030 Elevated blood-pressure reading, without diagnosis of hypertension: Secondary | ICD-10-CM | POA: Diagnosis not present

## 2017-12-14 DIAGNOSIS — M8580 Other specified disorders of bone density and structure, unspecified site: Secondary | ICD-10-CM

## 2017-12-14 MED ORDER — CLOBETASOL PROPIONATE 0.05 % EX CREA: 1.0000 "application " | TOPICAL_CREAM | Freq: Every day | CUTANEOUS | 0 refills | Status: DC

## 2017-12-14 MED ORDER — CLOBETASOL PROP EMOLLIENT BASE 0.05 % EX CREA: 1.0000 "application " | TOPICAL_CREAM | Freq: Every day | CUTANEOUS | 0 refills | Status: DC

## 2017-12-14 NOTE — Patient Instructions (Addendum)
My chart me in 1 week  F/u in 2 months  Take care   Lichen Planus Lichen planus is a skin problem. It causes redness, itching, small bumps, and sores. Areas of the body that are often affected include:  Arms, wrists, legs, or ankles.  Chest, back, or belly (abdomen).  Genital areas such as the vulva and vagina.  Gums and inside of the mouth.  Scalp.  Fingernails or toenails.  Treatment can help to control symptoms. This condition is not passed from one person to another (not contagious). It can last for a long time. Follow these instructions at home:  Take over-the-counter and prescription medicines only as told by your doctor.  Use creams or ointments as told by your doctor.  Do not scratch the affected areas of skin.  Women should keep the vagina as clean and dry as they can. Contact a doctor if:  Your redness, swelling, or pain gets worse.  You have fluid, blood, or pus coming from the affected area.  Your eyes become irritated. This information is not intended to replace advice given to you by your health care provider. Make sure you discuss any questions you have with your health care provider. Document Released: 11/03/2008 Document Revised: 04/28/2016 Document Reviewed: 02/16/2015 Elsevier Interactive Patient Education  [id number].  Lichen Sclerosus Lichen sclerosus is a skin problem. It can happen on any part of the body. It happens most often in the anal or genital areas. It can cause itching and discomfort. Treatment can help to control symptoms. This skin problem is not passed from one person to another (not contagious). The cause is not known. Follow these instructions at home:  Take over-the-counter and prescription medicines only as told by your doctor.  Use creams or ointments as told by your doctor.  Do not scratch the affected areas of skin.  Women should keep the vagina as clean and dry as they can.  Keep all follow-up visits as told by  your doctor. This is important. Contact a doctor if:  Your redness, swelling, or pain gets worse.  You have fluid, blood, or pus coming from the area.  You have new patches (lesions) on your skin.  You have a fever.  You have pain during sex. This information is not intended to replace advice given to you by your health care provider. Make sure you discuss any questions you have with your health care provider. Document Released: 11/03/2008 Document Revised: 04/28/2016 Document Reviewed: 02/16/2015 Elsevier Interactive Patient Education  2018 [place].  Atrophic Vaginitis Atrophic vaginitis is when the tissues that line the vagina become dry and thin. This is caused by a drop in estrogen. Estrogen helps:  To keep the vagina moist.  To make a clear fluid that helps: ? To lubricate the vagina for sex. ? To protect the vagina from infection.  If the lining of the vagina is dry and thin, it may:  Make sex painful. It may also cause bleeding.  Cause a feeling of: ? Burning. ? Irritation. ? Itchiness.  Make an exam of your vagina painful. It may also cause bleeding.  Make you lose interest in sex.  Cause a burning feeling when you pee.  Make your vaginal fluid (discharge) brown or yellow.  For some women, there are no symptoms. This condition is most common in women who do not get their regular menstrual periods anymore (menopause). This often starts when a woman is 66-12 years old. Follow these instructions at home:  Take medicines only as told by your doctor. Do not use any herbal or alternative medicines unless your doctor says it is okay.  Use over-the-counter products for dryness only as told by your doctor. These include: ? Creams. ? Lubricants. ? Moisturizers.  Do not douche.  Do not use products that can make your vagina dry. These include: ? Scented feminine sprays. ? Scented tampons. ? Scented soaps.  If it hurts to have sex, tell your sexual  partner. Contact a doctor if:  Your discharge looks different than normal.  Your vagina has an unusual smell.  You have new symptoms.  Your symptoms do not get better with treatment.  Your symptoms get worse. This information is not intended to replace advice given to you by your health care provider. Make sure you discuss any questions you have with your health care provider. Document Released: 05/09/2008 Document Revised: 04/28/2016 Document Reviewed: 11/12/2014 Elsevier Interactive Patient Education  [employer].

## 2017-12-15 ENCOUNTER — Encounter: Payer: Self-pay | Admitting: Family

## 2017-12-17 ENCOUNTER — Encounter: Payer: Self-pay | Admitting: Internal Medicine

## 2017-12-17 NOTE — Progress Notes (Signed)
Chief Complaint  Patient presents with  . Follow-up   F/u  1. She c/o since pap tried to use premarin expired since 01/2017 and caused ab cramping and vaginal burning advised this can be side effect of topical hormonal tx also she tried to used vagifem with similar sx's x 10 years she reports vaginal dryness and atrophy sx's and has used vaginal ring, vagifem and premarin. She also reports after paps skin tears which cause irritation and itching. She is having some external vaginal itching and internal burning. She tried replens as advised but this burned too. She has also tried qd Estradiol pill 0.1 mg qd 2. Pending appt with Endocrine for declining T score on DEXA though on calcium and vit D she wants to know if she should take vitamin K2 with calcium and vit D to help with bones  3. BP elevated today 150/84 BP has been elevated at other times with MD visits and she will check at home reviewed she may need to be on BP medication I.e oral Norvasc 2.5 mg qd  4. She reports chronic back pain but she has increased her exercise to 14 minutes 1x per day now She has also reduced caffeine intake and cut out diet sodas.   5. 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    Review of Systems  Constitutional: Negative for weight loss.  Respiratory: Negative for shortness of breath.   Cardiovascular: Negative for chest pain.  Genitourinary:       +vaginal atrophy burning interior/exterior and itching  Musculoskeletal: Positive for back pain.       +chronic back pain   Skin:       +vaginal atrophy   Past Medical History:  Diagnosis Date  . Allergy    hay fever  . Arthritis   . Arthritis    right knee, followed by Jefm Bryant  . Chicken pox   . Low back pain    recurrent  . Osteopenia   . Scoliosis   . Scoliosis   . UTI (urinary tract infection)    Past Surgical History:  Procedure Laterality Date  . BREAST BIOPSY Right 2000 and 2005    fibroadenoma right breast x2  . NASAL SEPTUM SURGERY    . partial knee replacement Right 2013   Dr. Jefm Bryant  . semithyroidectomy of right side  08/2106  . TUBAL LIGATION    . VAGINAL DELIVERY     2   Family History  Problem Relation Age of Onset  . Cancer Mother        breast  . Breast cancer Mother 71  . Cancer Father        lung   Social History   Socioeconomic History  . Marital status: Married    Spouse name: Not on file  . Number of children: Not on file  . Years of education: Not on file  . Highest education level: Not on file  Social Needs  . Financial resource strain: Not on file  . Food insecurity - worry: Not on file  . Food insecurity - inability: Not on file  . Transportation needs - medical: Not on file  . Transportation needs - non-medical: Not on file  Occupational History  . Not on file  Tobacco Use  . Smoking status: Never Smoker  . Smokeless tobacco: Never Used  Substance and Sexual Activity  . Alcohol use: Yes    Comment: occasional  . Drug  use: Yes  . Sexual activity: Not on file  Other Topics Concern  . Not on file  Social History Narrative   Lives in [place]. Moved from [place] in [employer], [employer]. Plans to retire 09/25/16. Currently appealing rejection for LT disability (12/2016).          Diet - regular, healthy; swims 4-5x per week   Exercise - none, walked previously   Current Meds  Medication Sig  . Calcium-Magnesium-Vitamin D (CALCIUM 500 PO) Take by mouth.  . Cholecalciferol (VITAMIN D) 2000 UNITS CAPS Take 1 capsule by mouth daily.  . cycloSPORINE (RESTASIS) 0.05 % ophthalmic emulsion Place 1 drop into both eyes 2 (two) times daily.  . fexofenadine (ALLEGRA) 30 MG tablet Take 30 mg 2 (two) times daily by mouth.  . flurandrenolide (CORDRAN) 0.05 % lotion Apply topically daily as needed. Do not use on face.  . fluticasone (FLONASE) 50 MCG/ACT nasal spray Place 2 sprays into both nostrils daily.  Marland Kitchen Hyaluronic  Acid-Vitamin C (HYALURONIC ACID PO) Take 2 capsules by mouth daily.  Marland Kitchen levothyroxine (SYNTHROID, LEVOTHROID) 25 MCG tablet Take 1 tablet (25 mcg total) every morning by mouth.  . meloxicam (MOBIC) 15 MG tablet   . OVER THE COUNTER MEDICATION Take 3 capsules by mouth daily. Instaflex  . OVER THE COUNTER MEDICATION Take 1 capsule by mouth 2 (two) times daily. Tumeric   Allergies  Allergen Reactions  . Penicillin V Potassium Rash  . Penicillins Hives and Rash   Recent Results (from the past 2160 hour(s))  VITAMIN D 25 Hydroxy (Vit-D Deficiency, Fractures)     Status: None   Collection Time: 10/12/17  8:05 AM  Result Value Ref Range   VITD 44.87 30.00 - 100.00 ng/mL  TSH     Status: None   Collection Time: 10/12/17  8:05 AM  Result Value Ref Range   TSH 4.05 0.35 - 4.50 uIU/mL  T4, free     Status: None   Collection Time: 10/12/17  8:05 AM  Result Value Ref Range   Free T4 0.98 0.60 - 1.60 ng/dL    Comment: Specimens from patients who are undergoing biotin therapy and /or ingesting biotin supplements may contain high levels of biotin.  The higher biotin concentration in these specimens interferes with this Free T4 assay.  Specimens that contain high levels  of biotin may cause false high results for this Free T4 assay.  Please interpret results in light of the total clinical presentation of the patient.    T3, free     Status: None   Collection Time: 10/12/17  8:05 AM  Result Value Ref Range   T3, Free 3.3 2.3 - 4.2 pg/mL  Lipid panel     Status: Abnormal   Collection Time: 10/12/17  8:05 AM  Result Value Ref Range   Cholesterol 176 0 - 200 mg/dL    Comment: ATP III Classification       Desirable:  < 200 mg/dL               Borderline High:  200 - 239 mg/dL          High:  > = 240 mg/dL   Triglycerides 67.0 0.0 - 149.0 mg/dL    Comment: Normal:  <150 mg/dLBorderline High:  150 - 199 mg/dL   HDL 47.30 >39.00 mg/dL   VLDL 13.4 0.0 - 40.0 mg/dL   LDL Cholesterol 115 (H) 0 - 99 mg/dL  Total CHOL/HDL Ratio 4     Comment:                Men          Women1/2 Average Risk     3.4          3.3Average Risk          5.0          4.42X Average Risk          9.6          7.13X Average Risk          15.0          11.0                       NonHDL 128.80     Comment: NOTE:  Non-HDL goal should be 30 mg/dL higher than patient's LDL goal (i.e. LDL goal of < 70 mg/dL, would have non-HDL goal of < 100 mg/dL)  Comprehensive metabolic panel     Status: Abnormal   Collection Time: 10/12/17  8:05 AM  Result Value Ref Range   Sodium 136 135 - 145 mEq/L   Potassium 4.3 3.5 - 5.1 mEq/L   Chloride 102 96 - 112 mEq/L   CO2 29 19 - 32 mEq/L   Glucose, Bld 98 70 - 99 mg/dL   BUN 18 6 - 23 mg/dL   Creatinine, Ser 0.72 0.40 - 1.20 mg/dL   Total Bilirubin 0.7 0.2 - 1.2 mg/dL   Alkaline Phosphatase 36 (L) 39 - 117 U/L   AST 19 0 - 37 U/L   ALT 19 0 - 35 U/L   Total Protein 6.5 6.0 - 8.3 g/dL   Albumin 4.0 3.5 - 5.2 g/dL   Calcium 9.4 8.4 - 10.5 mg/dL   GFR 86.30 >60.00 mL/min  CBC with Differential/Platelet     Status: None   Collection Time: 10/12/17  8:05 AM  Result Value Ref Range   WBC 5.9 4.0 - 10.5 K/uL   RBC 4.45 3.87 - 5.11 Mil/uL   Hemoglobin 14.3 12.0 - 15.0 g/dL   HCT 41.9 36.0 - 46.0 %   MCV 94.0 78.0 - 100.0 fl   MCHC 34.2 30.0 - 36.0 g/dL   RDW 12.0 11.5 - 15.5 %   Platelets 255.0 150.0 - 400.0 K/uL   Neutrophils Relative % 56.6 43.0 - 77.0 %   Lymphocytes Relative 30.7 12.0 - 46.0 %   Monocytes Relative 9.4 3.0 - 12.0 %   Eosinophils Relative 2.0 0.0 - 5.0 %   Basophils Relative 1.3 0.0 - 3.0 %   Neutro Abs 3.3 1.4 - 7.7 K/uL   Lymphs Abs 1.8 0.7 - 4.0 K/uL   Monocytes Absolute 0.6 0.1 - 1.0 K/uL   Eosinophils Absolute 0.1 0.0 - 0.7 K/uL   Basophils Absolute 0.1 0.0 - 0.1 K/uL  Cytology - PAP     Status: None   Collection Time: 11/17/17 12:00 AM  Result Value Ref Range   Adequacy      Satisfactory for evaluation  endocervical/transformation zone component  PRESENT.   Diagnosis      NEGATIVE FOR INTRAEPITHELIAL LESIONS OR MALIGNANCY. BENIGN REACTIVE/REPARATIVE CHANGES.   HPV NOT DETECTED     Comment: Normal Reference Range - NOT Detected   Material Submitted CervicoVaginal Pap [ThinPrep Imaged]   Urinalysis, Routine w reflex microscopic     Status: Abnormal   Collection Time: 11/23/17 11:13 AM  Result Value Ref Range   Color, Urine YELLOW Yellow;Lt. Yellow   APPearance CLEAR Clear   Specific Gravity, Urine <=1.005 (A) 1.000 - 1.030   pH 6.5 5.0 - 8.0   Total Protein, Urine NEGATIVE Negative   Urine Glucose NEGATIVE Negative   Ketones, ur NEGATIVE Negative   Bilirubin Urine NEGATIVE Negative   Hgb urine dipstick NEGATIVE Negative   Urobilinogen, UA 0.2 0.0 - 1.0   Leukocytes, UA NEGATIVE Negative   Nitrite NEGATIVE Negative   WBC, UA none seen 0-2/hpf   RBC / HPF none seen 0-2/hpf   Squamous Epithelial / LPF Few(5-10/hpf) (A) Rare(0-4/hpf)   Objective  Body mass index is 30.53 kg/m. Wt Readings from Last 3 Encounters:  12/14/17 160 lb 4 oz (72.7 kg)  11/17/17 159 lb 4 oz (72.2 kg)  10/11/17 158 lb 6.4 oz (71.8 kg)   Temp Readings from Last 3 Encounters:  12/14/17 98.1 F (36.7 C) (Oral)  11/17/17 98.4 F (36.9 C) (Oral)  10/11/17 98.1 F (36.7 C) (Oral)   BP Readings from Last 3 Encounters:  12/14/17 (!) 150/84  11/17/17 140/88  10/11/17 124/80   Pulse Readings from Last 3 Encounters:  12/14/17 100  11/17/17 (!) 111  10/11/17 (!) 105   O2 sat room air 97% Physical Exam  Constitutional: She is oriented to person, place, and time and well-developed, well-nourished, and in no distress.  HENT:  Head: Normocephalic and atraumatic.  Eyes: Conjunctivae are normal. Pupils are equal, round, and reactive to light.  Cardiovascular: Normal rate, regular rhythm and normal heart sounds.  Pulmonary/Chest: Effort normal and breath sounds normal.  Neurological: She is alert and oriented to person, place, and time. Gait normal.  Gait normal.  Skin: Skin is warm and dry.  Psychiatric: Mood, memory, affect and judgment normal.  Nursing note and vitals reviewed.   Assessment   1. Vaginal atrophy  2. Osteopenia  3. Elevated BP possibly HTN as had prior borderling readings  4. HM  Plan  1. Trial clobetasol qhs prn 1 week exterior only  Hold topical estrogens for now in 1 week can try replens again hopefully can tolerate Using mild soap to body and water to vaginal region 2. Pending Endocrine f/u to disc tx declining osteopenia 01/19/18  I do not know much about vitamin K2 and bone health defer to endocrine to see if should take  Also disc supplements not FDA approved use with caution   3. Consider low dose norvasc 2.5 pt to call back in 1 week with log of BP 4.  Had flu shot  Had prevnar, pna 23 due 11/2018  Had Tdap Disc shinrix but disc nat'l backorder consider future   mammo sch 12/25/17 Pap had 11/2017 with neg benign changes neg pap, per pt had ASCUS 21 years ago  Colonoscopy had 02/06/15 diverticulosis [facility]    Provider: Dr. Olivia Mackie McLean-Scocuzza-Internal Medicine

## 2017-12-25 ENCOUNTER — Ambulatory Visit
Admission: RE | Admit: 2017-12-25 | Discharge: 2017-12-25 | Disposition: A | Discharge status: Home / Self Care | Payer: Medicare Other | Source: Ambulatory Visit | Attending: Family | Admitting: Family

## 2017-12-25 DIAGNOSIS — Z1231 Encounter for screening mammogram for malignant neoplasm of breast: Secondary | ICD-10-CM | POA: Insufficient documentation

## 2017-12-25 DIAGNOSIS — Z1239 Encounter for other screening for malignant neoplasm of breast: Secondary | ICD-10-CM

## 2018-02-12 ENCOUNTER — Ambulatory Visit: Payer: Medicare Other | Admitting: Family

## 2018-02-16 ENCOUNTER — Ambulatory Visit: Payer: Medicare Other | Admitting: Internal Medicine

## 2018-02-22 ENCOUNTER — Ambulatory Visit: Payer: Self-pay | Admitting: *Deleted

## 2018-02-22 NOTE — Telephone Encounter (Signed)
Returned pt's call at 23 minutes. Husband answered and stated wife was on her way to [facility].

## 2018-03-09 ENCOUNTER — Ambulatory Visit: Payer: Self-pay | Admitting: Family

## 2018-04-06 ENCOUNTER — Encounter: Payer: Self-pay | Admitting: Family

## 2018-04-09 ENCOUNTER — Other Ambulatory Visit: Payer: Self-pay | Admitting: Family

## 2018-04-09 DIAGNOSIS — N952 Postmenopausal atrophic vaginitis: Secondary | ICD-10-CM

## 2018-04-09 MED ORDER — ESTRADIOL 0.1 MG/GM VA CREA: TOPICAL_CREAM | VAGINAL | 11 refills | Status: DC

## 2018-04-09 NOTE — Progress Notes (Signed)
close

## 2018-04-17 ENCOUNTER — Ambulatory Visit (INDEPENDENT_AMBULATORY_CARE_PROVIDER_SITE_OTHER): Payer: Medicare Other | Admitting: Family Medicine

## 2018-04-17 ENCOUNTER — Encounter: Payer: Self-pay | Admitting: Family Medicine

## 2018-04-17 VITALS — BP 140/82 | HR 95 | Temp 98.6°F | Resp 16 | Wt 161.1 lb

## 2018-04-17 DIAGNOSIS — R300 Dysuria: Secondary | ICD-10-CM

## 2018-04-17 LAB — POC URINALSYSI DIPSTICK (AUTOMATED)
Bilirubin, UA: NEGATIVE
Blood, UA: NEGATIVE
GLUCOSE UA: NEGATIVE
Ketones, UA: NEGATIVE
LEUKOCYTES UA: NEGATIVE
Nitrite, UA: NEGATIVE
PROTEIN UA: NEGATIVE
SPEC GRAV UA: 1.015 (ref 1.010–1.025)
Urobilinogen, UA: 0.2 E.U./dL
pH, UA: 5.5 (ref 5.0–8.0)

## 2018-04-17 LAB — URINALYSIS, MICROSCOPIC ONLY: RBC / HPF: NONE SEEN (ref 0–?)

## 2018-04-17 NOTE — Progress Notes (Signed)
Patient ID: Jill Michael, female   DOB: 1951/09/07, 67 y.o.   MRN: [id number]    PCP: Burnard Hawthorne, FNP  Subjective:  Jill Michael is a 67 y.o. year old very pleasant female patient who presents with Urinary Tract symptoms: symptoms including dysuria, urgency, frequency, and suprapubic pressure. Associated chills x 1 that have improved -started: 2 weeks and started gradually , symptoms are not improving -previous treatments: cranberry juice has provided limited  History of UTI on 02/22/18 which resolved after cipro and macrobid per patient.  ROS-denies fever, sweats, N/V, flank pain, or blood in urine  Pertinent Past Medical History- Atrophic vaginitis  Medications- reviewed  Current Outpatient Medications  Medication Sig Dispense Refill  . Calcium-Magnesium-Vitamin D (CALCIUM 500 PO) Take by mouth.    . Cholecalciferol (VITAMIN D) 2000 UNITS CAPS Take 1 capsule by mouth daily.    . clobetasol cream (TEMOVATE) 4.09 % Apply 1 application topically at bedtime. External use only qhs x 1 week 45 g 0  . cycloSPORINE (RESTASIS) 0.05 % ophthalmic emulsion Place 1 drop into both eyes 2 (two) times daily. 180 each 3  . estradiol (ESTRACE) 0.1 MG/GM vaginal cream 0.5 g intravaginally 1-3 times per week. 42.5 g 11  . fexofenadine (ALLEGRA) 30 MG tablet Take 30 mg 2 (two) times daily by mouth.    . flurandrenolide (CORDRAN) 0.05 % lotion Apply topically daily as needed. Do not use on face. 60 mL 1  . fluticasone (FLONASE) 50 MCG/ACT nasal spray Place 2 sprays into both nostrils daily. 16 g 3  . Hyaluronic Acid-Vitamin C (HYALURONIC ACID PO) Take 2 capsules by mouth daily.    Marland Kitchen levothyroxine (SYNTHROID, LEVOTHROID) 25 MCG tablet Take 1 tablet (25 mcg total) every morning by mouth. 30 tablet 11  . meloxicam (MOBIC) 15 MG tablet     . OVER THE COUNTER MEDICATION Take 3 capsules by mouth daily. Instaflex    . OVER THE COUNTER MEDICATION Take 1 capsule by mouth 2 (two) times daily.  Tumeric     No current facility-administered medications for this visit.     Objective: BP 140/82 (BP Location: Left Arm, Patient Position: Sitting, Cuff Size: Normal)   Pulse 95   Temp 98.6 F (37 C) (Oral)   Resp 16   Wt 161 lb 2 oz (73.1 kg)   SpO2 96%   BMI 30.70 kg/m  Gen: NAD, resting comfortably HEENT: oropharynx is clear and moist CV: RRR no murmurs rubs or gallops Lungs: CTAB no crackles, wheeze, rhonchi Abdomen: soft/nontender/nondistended/normal bowel sounds. No rebound or guarding.  No CVA tenderness.   Mild suprapubic tenderness present Ext: no edema Skin: warm, dry, no rash Neuro: grossly normal, moves all extremities  Assessment/Plan: 1. Dysuria - Urine Culture - Urine Microscopic  Dysuria UA is unremarkable. Symptoms are challenging with history of atrophic vaginitis and recent treatment with estradiol. She reports noticing vaginal symptoms and restarting estradiol on 04/09/18 because she felt that symptoms were related to vaginal dryness. We discussed that symptoms of dysuria may be related and also can be related to urinary tract also. Will send for culture and further treatment will be determined based upon results.  We further discussed options of women's pelvic health specialists that can be considered. Also advised use of estradiol and she can apply this externally around vaginal opening before inserting remainder of dose in vagina.  Encouraged hydration and she can use cranberry tablets as she has been doing. Further advised her to follow up  if symptoms, worsen or she develops a fever >101, or back pain.   Finally, we reviewed reasons to return to care including if symptoms worsen or persist or new concerns arise- once again particularly fever, N/V, or flank pain.  Laurita Quint, FNP

## 2018-04-19 LAB — URINE CULTURE
MICRO NUMBER:: 90586225
RESULT: NO GROWTH
SPECIMEN QUALITY:: ADEQUATE

## 2018-08-02 ENCOUNTER — Encounter: Payer: Self-pay | Admitting: Family

## 2018-08-02 DIAGNOSIS — Z0000 Encounter for general adult medical examination without abnormal findings: Secondary | ICD-10-CM

## 2018-08-03 MED ORDER — FLUTICASONE PROPIONATE 50 MCG/ACT NA SUSP: 2.0000 | Freq: Every day | NASAL | 3 refills | Status: DC

## 2018-08-08 ENCOUNTER — Other Ambulatory Visit: Payer: Self-pay | Admitting: Family

## 2018-08-08 DIAGNOSIS — Z0000 Encounter for general adult medical examination without abnormal findings: Secondary | ICD-10-CM

## 2018-08-08 DIAGNOSIS — N952 Postmenopausal atrophic vaginitis: Secondary | ICD-10-CM

## 2018-08-08 MED ORDER — ESTRADIOL 0.1 MG/GM VA CREA: TOPICAL_CREAM | VAGINAL | 11 refills | Status: DC

## 2018-08-08 MED ORDER — FLUTICASONE PROPIONATE 50 MCG/ACT NA SUSP: 2.0000 | Freq: Every day | NASAL | 11 refills | Status: DC

## 2018-08-12 ENCOUNTER — Other Ambulatory Visit: Payer: Self-pay | Admitting: Family

## 2018-08-12 DIAGNOSIS — Z0000 Encounter for general adult medical examination without abnormal findings: Secondary | ICD-10-CM

## 2018-09-10 ENCOUNTER — Encounter: Payer: Self-pay | Admitting: Family

## 2018-09-12 ENCOUNTER — Other Ambulatory Visit: Payer: Self-pay | Admitting: Family

## 2018-09-12 DIAGNOSIS — E041 Nontoxic single thyroid nodule: Secondary | ICD-10-CM

## 2018-09-24 ENCOUNTER — Ambulatory Visit
Admission: RE | Admit: 2018-09-24 | Discharge: 2018-09-24 | Disposition: A | Discharge status: Home / Self Care | Payer: Medicare Other | Source: Ambulatory Visit | Attending: Family | Admitting: Family

## 2018-09-24 ENCOUNTER — Encounter: Payer: Self-pay | Admitting: Family

## 2018-09-24 DIAGNOSIS — E041 Nontoxic single thyroid nodule: Secondary | ICD-10-CM

## 2018-09-24 DIAGNOSIS — Z9889 Other specified postprocedural states: Secondary | ICD-10-CM | POA: Insufficient documentation

## 2018-11-14 ENCOUNTER — Other Ambulatory Visit: Payer: Self-pay | Admitting: Family

## 2018-11-14 DIAGNOSIS — Z1231 Encounter for screening mammogram for malignant neoplasm of breast: Secondary | ICD-10-CM

## 2018-11-19 ENCOUNTER — Ambulatory Visit (INDEPENDENT_AMBULATORY_CARE_PROVIDER_SITE_OTHER): Payer: Medicare Other | Admitting: Family

## 2018-11-19 ENCOUNTER — Encounter: Payer: Self-pay | Admitting: Family

## 2018-11-19 VITALS — BP 142/88 | HR 111 | Temp 97.9°F | Resp 16 | Ht 65.5 in | Wt 157.6 lb

## 2018-11-19 DIAGNOSIS — Z23 Encounter for immunization: Secondary | ICD-10-CM

## 2018-11-19 DIAGNOSIS — R030 Elevated blood-pressure reading, without diagnosis of hypertension: Secondary | ICD-10-CM

## 2018-11-19 DIAGNOSIS — M25531 Pain in right wrist: Secondary | ICD-10-CM | POA: Diagnosis not present

## 2018-11-19 DIAGNOSIS — Z136 Encounter for screening for cardiovascular disorders: Secondary | ICD-10-CM

## 2018-11-19 DIAGNOSIS — N952 Postmenopausal atrophic vaginitis: Secondary | ICD-10-CM | POA: Diagnosis not present

## 2018-11-19 DIAGNOSIS — R140 Abdominal distension (gaseous): Secondary | ICD-10-CM | POA: Diagnosis not present

## 2018-11-19 DIAGNOSIS — E041 Nontoxic single thyroid nodule: Secondary | ICD-10-CM

## 2018-11-19 DIAGNOSIS — M25532 Pain in left wrist: Secondary | ICD-10-CM | POA: Diagnosis not present

## 2018-11-19 DIAGNOSIS — Z0000 Encounter for general adult medical examination without abnormal findings: Secondary | ICD-10-CM | POA: Diagnosis not present

## 2018-11-19 DIAGNOSIS — T300 Burn of unspecified body region, unspecified degree: Secondary | ICD-10-CM

## 2018-11-19 DIAGNOSIS — J309 Allergic rhinitis, unspecified: Secondary | ICD-10-CM

## 2018-11-19 LAB — COMPREHENSIVE METABOLIC PANEL
ALT: 20 U/L (ref 0–35)
AST: 21 U/L (ref 0–37)
Albumin: 4.3 g/dL (ref 3.5–5.2)
Alkaline Phosphatase: 38 U/L — ABNORMAL LOW (ref 39–117)
BUN: 17 mg/dL (ref 6–23)
CHLORIDE: 102 meq/L (ref 96–112)
CO2: 28 mEq/L (ref 19–32)
Calcium: 9.2 mg/dL (ref 8.4–10.5)
Creatinine, Ser: 0.75 mg/dL (ref 0.40–1.20)
GFR: 82.05 mL/min (ref >60.00)
Glucose, Bld: 95 mg/dL (ref 70–99)
Potassium: 3.7 mEq/L (ref 3.5–5.1)
Sodium: 138 mEq/L (ref 135–145)
Total Bilirubin: 0.6 mg/dL (ref 0.2–1.2)
Total Protein: 7.1 g/dL (ref 6.0–8.3)

## 2018-11-19 LAB — CBC WITH DIFFERENTIAL/PLATELET
BASOS PCT: 1 % (ref 0.0–3.0)
Basophils Absolute: 0.1 10*3/uL (ref 0.0–0.1)
Eosinophils Absolute: 0.1 10*3/uL (ref 0.0–0.7)
Eosinophils Relative: 1.4 % (ref 0.0–5.0)
HCT: 45.3 % (ref 36.0–46.0)
Hemoglobin: 15.2 g/dL — ABNORMAL HIGH (ref 12.0–15.0)
Lymphocytes Relative: 19.1 % (ref 12.0–46.0)
Lymphs Abs: 1.4 10*3/uL (ref 0.7–4.0)
MCHC: 33.6 g/dL (ref 30.0–36.0)
MCV: 95.2 fl (ref 78.0–100.0)
Monocytes Absolute: 0.7 10*3/uL (ref 0.1–1.0)
Monocytes Relative: 9.1 % (ref 3.0–12.0)
Neutro Abs: 5 10*3/uL (ref 1.4–7.7)
Neutrophils Relative %: 69.4 % (ref 43.0–77.0)
Platelets: 238 10*3/uL (ref 150.0–400.0)
RBC: 4.76 Mil/uL (ref 3.87–5.11)
RDW: 13.1 % (ref 11.5–15.5)
WBC: 7.3 10*3/uL (ref 4.0–10.5)

## 2018-11-19 LAB — LIPID PANEL
Cholesterol: 167 mg/dL (ref 0–200)
HDL: 49.5 mg/dL (ref >39.00)
LDL Cholesterol: 103 mg/dL — ABNORMAL HIGH (ref 0–99)
NonHDL: 117.81
Total CHOL/HDL Ratio: 3
Triglycerides: 72 mg/dL (ref 0.0–149.0)
VLDL: 14.4 mg/dL (ref 0.0–40.0)

## 2018-11-19 LAB — TSH: TSH: 3 u[IU]/mL (ref 0.35–4.50)

## 2018-11-19 MED ORDER — SILVER SULFADIAZINE 1 % EX CREA: 1.0000 "application " | TOPICAL_CREAM | Freq: Every day | CUTANEOUS | 1 refills | Status: DC

## 2018-11-19 MED ORDER — AMLODIPINE BESYLATE 2.5 MG PO TABS: 2.5000 mg | ORAL_TABLET | Freq: Every day | ORAL | 3 refills | Status: DC

## 2018-11-19 MED ORDER — CLOBETASOL PROPIONATE 0.05 % EX CREA: 1.0000 "application " | TOPICAL_CREAM | Freq: Every day | CUTANEOUS | 0 refills | Status: DC

## 2018-11-19 MED ORDER — FLUTICASONE PROPIONATE 50 MCG/ACT NA SUSP: 2.0000 | Freq: Every day | NASAL | 5 refills | Status: DC

## 2018-11-19 NOTE — Assessment & Plan Note (Signed)
Discussed ultrasound results with patient.  She politely declines further annual surveillance at this time of left thyroid nodule.  She will let me know if she desires any surveillance going forward.

## 2018-11-19 NOTE — Assessment & Plan Note (Signed)
Mammogram scheduled.  Clinical breast exam performed today.  Pelvic exam deferred in the absence of complaints, and patient is no longer screening for cervical cancer.  Last Pap 67 years old was normal.

## 2018-11-19 NOTE — Progress Notes (Signed)
Subjective:    Patient ID: Jill Michael, female    DOB: Jul 08, 1951, 67 y.o.   MRN: [id number]  CC: Jill Michael is a 67 y.o. female who presents today for physical exam.    HPI: Has changed to gluten free and lactose free diet this summer due to diarrhea, bloating. Symptoms resolved.  Normal BM. No diarrhea, bloating.   Complains of wrist weakness, years. Had worked behind [employer], [employer] for years.  No numbness, swelling, redness.   She also complains of right ear pressure, this is been intermittent over the last month.  No fevers, headache, vision changes.  Endorses some sinus pressure, congestion.  Using Flonase.  No longer on mobic.   Hypothyroidism-compliant with medication.   States anxious when comes in doctor's office regards her blood pressure. At home 135/71, pulse 75. 'Starting to think needs blood pressure medication. ' No cp.   Vaginal atrophy- doing well estrace.  Recently seen by dermatology, a pelvic exam.  Advised her to continue as needed clobetasol.   Would like silvadene for when she has a burn at home.  Used to have this prescription at home, however expired.     Thyroid ultrasound October 2019 shows post right thyroid lobectomy without evidence of  locally recurrent disease.  Punctuate nodule within the left thyroid is unchanged compared to August 2017 and does not need imaging criteria to follow-up.  Colorectal Cancer Screening: UTD , Dr rein 2016. Breast Cancer Screening: Mammogram due, scheduled. Cervical Cancer Screening: Done at 67 year old. No malignancy or HPV.  No vaginal bleeding. Bone Health screening/DEXA for 65+: Osteopenia, due 2020 ; following with endocrine, Dr Ladell Pier for repeat lung Cancer Screening: Doesn't have 30 year pack year history and age > 34 years.       Tetanus - UTD        Pneumococcal - Candidate for.  Labs: Screening labs today. Exercise: Gets regular exercise, walking 18minutes 4x per week.  Alcohol use:  occassional Smoking/tobacco use: Nonsmoker.  Regular dental exams: UTD Wears seat belt: Yes. Follows with dermatology. H/o lichen planus per patient; unable to see report.  HISTORY:  Past Medical History:  Diagnosis Date  . Allergy    hay fever  . Arthritis   . Arthritis    right knee, followed by Jefm Bryant  . Chicken pox   . Low back pain    recurrent  . Osteopenia   . Scoliosis   . Scoliosis   . UTI (urinary tract infection)   . Vaginal atrophy     Past Surgical History:  Procedure Laterality Date  . BREAST BIOPSY Right 2000 and 2005   fibroadenoma right breast x2  . NASAL SEPTUM SURGERY    . partial knee replacement Right 2013   Dr. Jefm Bryant  . semithyroidectomy of right side  08/2106  . TUBAL LIGATION    . VAGINAL DELIVERY     2   Family History  Problem Relation Age of Onset  . Cancer Mother        breast  . Breast cancer Mother 58  . Cancer Father        lung      ALLERGIES: Penicillin v potassium and Penicillins  Current Outpatient Medications on File Prior to Visit  Medication Sig Dispense Refill  . Calcium-Magnesium-Vitamin D (CALCIUM 500 PO) Take by mouth.    . Cholecalciferol (VITAMIN D) 2000 UNITS CAPS Take 1 capsule by mouth daily.    . cycloSPORINE (RESTASIS) 0.05 % ophthalmic  emulsion Place 1 drop into both eyes 2 (two) times daily. 180 each 3  . estradiol (ESTRACE) 0.1 MG/GM vaginal cream Use intravaginally 1-3 times per week. 42.5 g 11  . fexofenadine (ALLEGRA) 30 MG tablet Take 30 mg 2 (two) times daily by mouth.    . flurandrenolide (CORDRAN) 0.05 % lotion Apply topically daily as needed. Do not use on face. 60 mL 1  . Hyaluronic Acid-Vitamin C (HYALURONIC ACID PO) Take 2 capsules by mouth daily.    Marland Kitchen levothyroxine (SYNTHROID, LEVOTHROID) 25 MCG tablet Take 1 tablet (25 mcg total) every morning by mouth. 30 tablet 11  . OVER THE COUNTER MEDICATION Take 3 capsules by mouth daily. Instaflex    . OVER THE COUNTER MEDICATION Take 1 capsule by  mouth 2 (two) times daily. Tumeric     No current facility-administered medications on file prior to visit.     Social History   Tobacco Use  . Smoking status: Never Smoker  . Smokeless tobacco: Never Used  Substance Use Topics  . Alcohol use: Yes    Comment: occasional  . Drug use: Yes    Review of Systems  Constitutional: Negative for chills, fever and unexpected weight change.  HENT: Positive for congestion and ear pain (right).   Respiratory: Negative for cough.   Cardiovascular: Negative for chest pain, palpitations and leg swelling.  Gastrointestinal: Negative for nausea and vomiting.  Genitourinary: Negative for menstrual problem, vaginal bleeding, vaginal discharge and vaginal pain (resolved. ).  Musculoskeletal: Positive for arthralgias (wrist pain). Negative for joint swelling and myalgias.  Skin: Negative for rash.  Neurological: Negative for headaches.  Hematological: Negative for adenopathy.  Psychiatric/Behavioral: Negative for confusion.      Objective:    BP (!) 142/88 (BP Location: Left Arm, Patient Position: Sitting, Cuff Size: Normal)   Pulse (!) 111   Temp 97.9 F (36.6 C) (Oral)   Resp 16   Ht 5' 5.5" (1.664 m)   Wt 157 lb 9.6 oz (71.5 kg)   SpO2 97%   BMI 25.83 kg/m   BP Readings from Last 3 Encounters:  11/19/18 (!) 142/88  04/17/18 140/82  12/14/17 (!) 150/84   Wt Readings from Last 3 Encounters:  11/19/18 157 lb 9.6 oz (71.5 kg)  04/17/18 161 lb 2 oz (73.1 kg)  12/14/17 160 lb 4 oz (72.7 kg)    Physical Exam Vitals signs reviewed.  Constitutional:      Appearance: She is well-developed.  HENT:     Head: Normocephalic and atraumatic.     Right Ear: Hearing, ear canal and external ear normal. No decreased hearing noted. No drainage, swelling or tenderness. A middle ear effusion is present. No foreign body. Tympanic membrane is not erythematous or bulging.     Left Ear: Hearing, tympanic membrane, ear canal and external ear normal. No  decreased hearing noted. No drainage, swelling or tenderness.  No middle ear effusion. No foreign body. Tympanic membrane is not erythematous or bulging.     Nose: Nose normal. No rhinorrhea.     Right Sinus: No maxillary sinus tenderness or frontal sinus tenderness.     Left Sinus: No maxillary sinus tenderness or frontal sinus tenderness.     Mouth/Throat:     Pharynx: Uvula midline. No oropharyngeal exudate or posterior oropharyngeal erythema.     Tonsils: No tonsillar abscesses.  Eyes:     Conjunctiva/sclera: Conjunctivae normal.  Neck:     Thyroid: No thyroid mass or thyromegaly.  Cardiovascular:  Rate and Rhythm: Normal rate and regular rhythm.     Pulses: Normal pulses.     Heart sounds: Normal heart sounds.  Pulmonary:     Effort: Pulmonary effort is normal.     Breath sounds: Normal breath sounds. No wheezing, rhonchi or rales.  Chest:     Breasts: Breasts are symmetrical.        Right: No inverted nipple, mass, nipple discharge, skin change or tenderness.        Left: No inverted nipple, mass, nipple discharge, skin change or tenderness.  Lymphadenopathy:     Head:     Right side of head: No submental, submandibular, tonsillar, preauricular, posterior auricular or occipital adenopathy.     Left side of head: No submental, submandibular, tonsillar, preauricular, posterior auricular or occipital adenopathy.     Cervical: No cervical adenopathy.     Right cervical: No superficial, deep or posterior cervical adenopathy.    Left cervical: No superficial, deep or posterior cervical adenopathy.  Skin:    General: Skin is warm and dry.  Neurological:     Mental Status: She is alert.  Psychiatric:        Speech: Speech normal.        Behavior: Behavior normal.        Thought Content: Thought content normal.        Assessment & Plan:   Problem List Items Addressed This Visit      Cardiovascular and Mediastinum   HTN (hypertension) - Primary    Start amlodipine.   Patient will monitor blood pressure at home.      Relevant Medications   amLODipine (NORVASC) 2.5 MG tablet     Respiratory   Allergic rhinitis    Patient seen behind right TM.   Advised patient to continue Flonase.  She will also try Mucinex at home.  If no improvement, advised patient that she needs to call the office as I would advise antibiotic as an appropriate next step.        Endocrine   Thyroid nodule    Discussed ultrasound results with patient.  She politely declines further annual surveillance at this time of left thyroid nodule.  She will let me know if she desires any surveillance going forward.      Relevant Orders   TSH     Genitourinary   Atrophic vaginitis    Following with dermatology; advised to continue this for [facility] surveillance.  PRN clobetasol refilled.        Other   Routine general medical examination at a health care facility    Mammogram scheduled.  Clinical breast exam performed today.  Pelvic exam deferred in the absence of complaints, and patient is no longer screening for cervical cancer.  Last Pap 67 years old was normal.      Relevant Medications   fluticasone (FLONASE) 50 MCG/ACT nasal spray   Other Relevant Orders   TSH   CBC with Differential/Platelet   Comprehensive metabolic panel   Lipid panel   Bloating    Resolved after started following gluten, lactose free diet.  Patient desires celiac screen.      Relevant Orders   Celiac Disease Ab Screen w/Rfx   CBC with Differential/Platelet   Pain in both wrists    Symptoms suggestive of carpal tunnel.  Referral to orthopedics      Relevant Orders   Ambulatory referral to Orthopedic Surgery   CBC with Differential/Platelet   Screening for cardiovascular condition  Pending lipid panel.      Relevant Orders   Lipid panel    Other Visit Diagnoses    Vaginal atrophy       Relevant Medications   clobetasol cream (TEMOVATE) 0.05 %   Burn       Relevant Medications   silver  sulfADIAZINE (SILVADENE) 1 % cream       I have discontinued [name]. Ramroop's meloxicam. I have also changed her fluticasone. Additionally, I am having her start on amLODipine and silver sulfADIAZINE. Lastly, I am having her maintain her Vitamin D, OVER THE COUNTER MEDICATION, Hyaluronic Acid-Vitamin C (HYALURONIC ACID PO), OVER THE COUNTER MEDICATION, Calcium-Magnesium-Vitamin D (CALCIUM 500 PO), cycloSPORINE, flurandrenolide, fexofenadine, levothyroxine, estradiol, and clobetasol cream.   Meds ordered this encounter  Medications  . amLODipine (NORVASC) 2.5 MG tablet    Sig: Take 1 tablet (2.5 mg total) by mouth daily.    Dispense:  90 tablet    Refill:  3    Order Specific Question:   Supervising Provider    Answer:   Deborra Medina L [[id number]]  . clobetasol cream (TEMOVATE) 0.05 %    Sig: Apply 1 application topically at bedtime. External use only qhs x 1 week    Dispense:  45 g    Refill:  0    Generic ok    Order Specific Question:   Supervising Provider    Answer:   TULLO, TERESA L [[id number]]  . fluticasone (FLONASE) 50 MCG/ACT nasal spray    Sig: Place 2 sprays into both nostrils daily.    Dispense:  50 g    Refill:  5    Order Specific Question:   Supervising Provider    Answer:   Deborra Medina L [[id number]]  . silver sulfADIAZINE (SILVADENE) 1 % cream    Sig: Apply 1 application topically daily.    Dispense:  50 g    Refill:  1    Order Specific Question:   Supervising Provider    Answer:   Crecencio Mc [[id number]]    Return precautions given.   Risks, benefits, and alternatives of the medications and treatment plan prescribed today were discussed, and patient expressed understanding.   Education regarding symptom management and diagnosis given to patient on AVS.   Continue to follow with Burnard Hawthorne, FNP for routine health maintenance.   Cyanna June Bruning and I agreed with plan.   Mable Paris, FNP

## 2018-11-19 NOTE — Assessment & Plan Note (Addendum)
Following with dermatology; advised to continue this for [facility] surveillance.  PRN clobetasol refilled.

## 2018-11-19 NOTE — Patient Instructions (Addendum)
Let me know in the future if ever you would like an ultrasound of the thyroid.      Start amlodipine.   Monitor blood pressure,  Goal is less than 120/80, based on newest guidelines; if persistently higher, please make sooner follow up appointment so we can recheck you blood pressure and manage medications  Continue follow up with endocrine and dermatology annually particularly for pelvic exam and cancer screening.  Today we discussed referrals, orders. Orthopedic.    I have placed these orders in the system for you.  Please be sure to give [place] a call if you have not heard from our office regarding this. We should hear from [place] within ONE week with information regarding your appointment. If not, please let me know immediately.    Trial mucinex for right ear pressure- if not better let me know                                                                                                                                                                                                                                              Health Maintenance for Postmenopausal Women Menopause is a normal process in which your reproductive ability comes to an end. This process happens gradually over a span of months to years, usually between the ages of 12 and 69. Menopause is complete when you have missed 12 consecutive menstrual periods. It is important to talk with your health care provider about some of the most common conditions that affect postmenopausal women, such as heart disease, cancer, and bone loss (osteoporosis). Adopting a healthy lifestyle and getting preventive care can help to promote your health and wellness. Those actions can also lower your chances of developing some of these common conditions. What should I know about menopause? During menopause, you may experience a number of symptoms, such as:  Moderate-to-severe hot flashes.  Night sweats.  Decrease in sex drive.  Mood  swings.  Headaches.  Tiredness.  Irritability.  Memory problems.  Insomnia.  Choosing to treat or not to treat menopausal changes is an individual decision that you make with your health care provider. What should I know about hormone replacement therapy and supplements? Hormone therapy products are effective for treating symptoms that are associated with menopause, such as hot flashes and night sweats. Hormone replacement carries certain risks, especially as you become older. If  you are thinking about using estrogen or estrogen with progestin treatments, discuss the benefits and risks with your health care provider. What should I know about heart disease and stroke? Heart disease, heart attack, and stroke become more likely as you age. This may be due, in part, to the hormonal changes that your body experiences during menopause. These can affect how your body processes dietary fats, triglycerides, and cholesterol. Heart attack and stroke are both medical emergencies. There are many things that you can do to help prevent heart disease and stroke:  Have your blood pressure checked at least every 1-2 years. High blood pressure causes heart disease and increases the risk of stroke.  If you are 71-36 years old, ask your health care provider if you should take aspirin to prevent a heart attack or a stroke.  Do not use any tobacco products, including cigarettes, chewing tobacco, or electronic cigarettes. If you need help quitting, ask your health care provider.  It is important to eat a healthy diet and maintain a healthy weight. ? Be sure to include plenty of vegetables, fruits, low-fat dairy products, and lean protein. ? Avoid eating foods that are high in solid fats, added sugars, or salt (sodium).  Get regular exercise. This is one of the most important things that you can do for your health. ? Try to exercise for at least 150 minutes each week. The type of exercise that you do should  increase your heart rate and make you sweat. This is known as moderate-intensity exercise. ? Try to do strengthening exercises at least twice each week. Do these in addition to the moderate-intensity exercise.  Know your numbers.Ask your health care provider to check your cholesterol and your blood glucose. Continue to have your blood tested as directed by your health care provider.  What should I know about cancer screening? There are several types of cancer. Take the following steps to reduce your risk and to catch any cancer development as early as possible. Breast Cancer  Practice breast self-awareness. ? This means understanding how your breasts normally appear and feel. ? It also means doing regular breast self-exams. Let your health care provider know about any changes, no matter how small.  If you are 13 or older, have a clinician do a breast exam (clinical breast exam or CBE) every year. Depending on your age, family history, and medical history, it may be recommended that you also have a yearly breast X-ray (mammogram).  If you have a family history of breast cancer, talk with your health care provider about genetic screening.  If you are at high risk for breast cancer, talk with your health care provider about having an MRI and a mammogram every year.  Breast cancer (BRCA) gene test is recommended for women who have family members with BRCA-related cancers. Results of the assessment will determine the need for genetic counseling and BRCA1 and for BRCA2 testing. BRCA-related cancers include these types: ? Breast. This occurs in males or females. ? Ovarian. ? Tubal. This may also be called fallopian tube cancer. ? Cancer of the abdominal or pelvic lining (peritoneal cancer). ? Prostate. ? Pancreatic.  Cervical, Uterine, and Ovarian Cancer Your health care provider may recommend that you be screened regularly for cancer of the pelvic organs. These include your ovaries, uterus,  and vagina. This screening involves a pelvic exam, which includes checking for microscopic changes to the surface of your cervix (Pap test).  For women ages 21-65, health care providers may  recommend a pelvic exam and a Pap test every three years. For women ages 62-65, they may recommend the Pap test and pelvic exam, combined with testing for human papilloma virus (HPV), every five years. Some types of HPV increase your risk of cervical cancer. Testing for HPV may also be done on women of any age who have unclear Pap test results.  Other health care providers may not recommend any screening for nonpregnant women who are considered low risk for pelvic cancer and have no symptoms. Ask your health care provider if a screening pelvic exam is right for you.  If you have had past treatment for cervical cancer or a condition that could lead to cancer, you need Pap tests and screening for cancer for at least 20 years after your treatment. If Pap tests have been discontinued for you, your risk factors (such as having a new sexual partner) need to be reassessed to determine if you should start having screenings again. Some women have medical problems that increase the chance of getting cervical cancer. In these cases, your health care provider may recommend that you have screening and Pap tests more often.  If you have a family history of uterine cancer or ovarian cancer, talk with your health care provider about genetic screening.  If you have vaginal bleeding after reaching menopause, tell your health care provider.  There are currently no reliable tests available to screen for ovarian cancer.  Lung Cancer Lung cancer screening is recommended for adults 46-82 years old who are at high risk for lung cancer because of a history of smoking. A yearly low-dose CT scan of the lungs is recommended if you:  Currently smoke.  Have a history of at least 30 pack-years of smoking and you currently smoke or have quit  within the past 15 years. A pack-year is smoking an average of one pack of cigarettes per day for one year.  Yearly screening should:  Continue until it has been 15 years since you quit.  Stop if you develop a health problem that would prevent you from having lung cancer treatment.  Colorectal Cancer  This type of cancer can be detected and can often be prevented.  Routine colorectal cancer screening usually begins at age 50 and continues through age 9.  If you have risk factors for colon cancer, your health care provider may recommend that you be screened at an earlier age.  If you have a family history of colorectal cancer, talk with your health care provider about genetic screening.  Your health care provider may also recommend using home test kits to check for hidden blood in your stool.  A small camera at the end of a tube can be used to examine your colon directly (sigmoidoscopy or colonoscopy). This is done to check for the earliest forms of colorectal cancer.  Direct examination of the colon should be repeated every 5-10 years until age 43. However, if early forms of precancerous polyps or small growths are found or if you have a family history or genetic risk for colorectal cancer, you may need to be screened more often.  Skin Cancer  Check your skin from head to toe regularly.  Monitor any moles. Be sure to tell your health care provider: ? About any new moles or changes in moles, especially if there is a change in a mole's shape or color. ? If you have a mole that is larger than the size of a pencil eraser.  If any of  your family members has a history of skin cancer, especially at a young age, talk with your health care provider about genetic screening.  Always use sunscreen. Apply sunscreen liberally and repeatedly throughout the day.  Whenever you are outside, protect yourself by wearing long sleeves, pants, a wide-brimmed hat, and sunglasses.  What should I know  about osteoporosis? Osteoporosis is a condition in which bone destruction happens more quickly than new bone creation. After menopause, you may be at an increased risk for osteoporosis. To help prevent osteoporosis or the bone fractures that can happen because of osteoporosis, the following is recommended:  If you are 28-18 years old, get at least 1,000 mg of calcium and at least 600 mg of vitamin D per day.  If you are older than age 81 but younger than age 80, get at least 1,200 mg of calcium and at least 600 mg of vitamin D per day.  If you are older than age 94, get at least 1,200 mg of calcium and at least 800 mg of vitamin D per day.  Smoking and excessive alcohol intake increase the risk of osteoporosis. Eat foods that are rich in calcium and vitamin D, and do weight-bearing exercises several times each week as directed by your health care provider. What should I know about how menopause affects my mental health? Depression may occur at any age, but it is more common as you become older. Common symptoms of depression include:  Low or sad mood.  Changes in sleep patterns.  Changes in appetite or eating patterns.  Feeling an overall lack of motivation or enjoyment of activities that you previously enjoyed.  Frequent crying spells.  Talk with your health care provider if you think that you are experiencing depression. What should I know about immunizations? It is important that you get and maintain your immunizations. These include:  Tetanus, diphtheria, and pertussis (Tdap) booster vaccine.  Influenza every year before the flu season begins.  Pneumonia vaccine.  Shingles vaccine.  Your health care provider may also recommend other immunizations. This information is not intended to replace advice given to you by your health care provider. Make sure you discuss any questions you have with your health care provider. Document Released: 01/13/2006 Document Revised: 06/10/2016  Document Reviewed: 08/25/2015 Elsevier Interactive Patient Education  2018 [employer].

## 2018-11-19 NOTE — Assessment & Plan Note (Signed)
Patient seen behind right TM.   Advised patient to continue Flonase.  She will also try Mucinex at home.  If no improvement, advised patient that she needs to call the office as I would advise antibiotic as an appropriate next step.

## 2018-11-19 NOTE — Assessment & Plan Note (Signed)
Pending lipid panel 

## 2018-11-19 NOTE — Assessment & Plan Note (Signed)
Symptoms suggestive of carpal tunnel.  Referral to orthopedics

## 2018-11-19 NOTE — Assessment & Plan Note (Signed)
Start amlodipine.  Patient will monitor blood pressure at home.

## 2018-11-19 NOTE — Assessment & Plan Note (Signed)
Resolved after started following gluten, lactose free diet.  Patient desires celiac screen.

## 2018-11-20 LAB — CELIAC DISEASE AB SCREEN W/RFX
ANTIGLIADIN ABS, IGA: 5 U (ref 0–19)
IgA/Immunoglobulin A, Serum: 240 mg/dL (ref 87–352)
Transglutaminase IgA: <2 U/mL (ref 0–3)

## 2018-11-21 ENCOUNTER — Encounter: Payer: Self-pay | Admitting: Family

## 2018-11-23 ENCOUNTER — Encounter: Payer: Self-pay | Admitting: Family

## 2018-11-23 ENCOUNTER — Other Ambulatory Visit: Payer: Self-pay | Admitting: Family

## 2018-11-23 DIAGNOSIS — E785 Hyperlipidemia, unspecified: Secondary | ICD-10-CM

## 2018-11-23 DIAGNOSIS — E041 Nontoxic single thyroid nodule: Secondary | ICD-10-CM

## 2018-11-23 MED ORDER — LEVOTHYROXINE SODIUM 25 MCG PO TABS: 25.0000 ug | ORAL_TABLET | Freq: Every morning | ORAL | 3 refills | Status: DC

## 2018-11-23 MED ORDER — ROSUVASTATIN CALCIUM 10 MG PO TABS: 10.0000 mg | ORAL_TABLET | Freq: Every day | ORAL | 3 refills | Status: DC

## 2018-11-30 ENCOUNTER — Encounter: Payer: Self-pay | Admitting: Family

## 2018-12-10 ENCOUNTER — Other Ambulatory Visit: Payer: Self-pay | Admitting: Family

## 2018-12-10 DIAGNOSIS — E785 Hyperlipidemia, unspecified: Secondary | ICD-10-CM

## 2018-12-10 MED ORDER — OMEGA-3-ACID ETHYL ESTERS 1 G PO CAPS: 2.0000 g | ORAL_CAPSULE | Freq: Two times a day (BID) | ORAL | 11 refills | Status: DC

## 2018-12-12 ENCOUNTER — Telehealth: Payer: Self-pay

## 2018-12-12 NOTE — Telephone Encounter (Signed)
Error

## 2018-12-17 DIAGNOSIS — M19049 Primary osteoarthritis, unspecified hand: Secondary | ICD-10-CM | POA: Insufficient documentation

## 2018-12-26 ENCOUNTER — Ambulatory Visit
Admission: RE | Admit: 2018-12-26 | Discharge: 2018-12-26 | Disposition: A | Discharge status: Home / Self Care | Payer: Medicare Other | Source: Ambulatory Visit | Attending: Family | Admitting: Family

## 2018-12-26 DIAGNOSIS — Z1231 Encounter for screening mammogram for malignant neoplasm of breast: Secondary | ICD-10-CM | POA: Diagnosis not present

## 2019-02-12 ENCOUNTER — Ambulatory Visit: Payer: Medicare Other | Attending: Surgery | Admitting: Occupational Therapy

## 2019-02-12 ENCOUNTER — Other Ambulatory Visit: Payer: Self-pay

## 2019-02-12 ENCOUNTER — Encounter: Payer: Self-pay | Admitting: Occupational Therapy

## 2019-02-12 DIAGNOSIS — M79642 Pain in left hand: Secondary | ICD-10-CM | POA: Diagnosis present

## 2019-02-12 DIAGNOSIS — M62838 Other muscle spasm: Secondary | ICD-10-CM | POA: Insufficient documentation

## 2019-02-12 DIAGNOSIS — M79641 Pain in right hand: Secondary | ICD-10-CM | POA: Insufficient documentation

## 2019-02-12 NOTE — Therapy (Signed)
[facility] [address], [place], [address] Phone: [phone]   Fax:  [phone]  Occupational Therapy Evaluation  Patient Details  Name: Jill Michael MRN: [id number] Date of Birth: 04/27/1951 Referring Provider (OT): Poggi   Encounter Date: 02/12/2019  OT End of Session - 02/12/19 2034    Visit Number  1    Number of Visits  4    Date for OT Re-Evaluation  03/12/19    OT Start Time  7824    OT Stop Time  1352    OT Time Calculation (min)  66 min    Activity Tolerance  Patient tolerated treatment well    Behavior During Therapy  [facility] for tasks assessed/performed       Past Medical History:  Diagnosis Date  . Allergy    hay fever  . Arthritis   . Arthritis    right knee, followed by Jefm Bryant  . Chicken pox   . Low back pain    recurrent  . Osteopenia   . Scoliosis   . Scoliosis   . UTI (urinary tract infection)   . Vaginal atrophy     Past Surgical History:  Procedure Laterality Date  . BREAST BIOPSY Right 2000 and 2005   fibroadenoma right breast x2  . NASAL SEPTUM SURGERY    . partial knee replacement Right 2013   Dr. Jefm Bryant  . semithyroidectomy of right side  08/2106  . TUBAL LIGATION    . VAGINAL DELIVERY     2    There were no vitals filed for this visit.  Subjective Assessment - 02/12/19 2023    Subjective   I worked for more than 46yrs for [employer] -and my thumbs alwasys hurt from using the microscope- my upper back , shouldes and neck - I still get therapy and chiropractor - but my thumbs got little better since stop working - but the last 6 months it has hurt me more to pinch grip or pinch and twist or turn knobs or buttons - R hand worse than the L     Patient Stated Goals  I want the pain better in my thumbs so I can do things like pinch , twist , turn knobs, grip when cutting , drive ,     Currently in Pain?  Yes    Pain Score  6     Pain Location  --   Thumb  webspaces    Pain Orientation  Right;Left    Pain Descriptors / Indicators  Aching;Sharp;Shooting;Tender    Pain Type  Chronic pain    Pain Onset  More than a month ago    Pain Frequency  Intermittent        [facility] OT Assessment - 02/12/19 0001      Assessment   Medical Diagnosis  bilateral thumb OA ,pain     Referring Provider (OT)  Poggi    Onset Date/Surgical Date  08/05/18    Hand Dominance  Right      Home  Environment   Lives With  Spouse      Prior Function   Vocation  On disability    Leisure  cook, on computer and around the house doing things       Strength   Right Hand Grip (lbs)  55    Right Hand Lateral Pinch  14 lbs    Right Hand 3 Point Pinch  16 lbs    Left  Hand Grip (lbs)  56    Left Hand Lateral Pinch  18 lbs    Left Hand 3 Point Pinch  17 lbs      Right Hand AROM   R Index  MCP 0-90  75 Degrees    R Index PIP 0-100  95 Degrees    R Long  MCP 0-90  80 Degrees    R Long PIP 0-100  95 Degrees    R Ring  MCP 0-90  90 Degrees    R Ring PIP 0-100  95 Degrees    R Little  MCP 0-90  80 Degrees    R Little PIP 0-100  95 Degrees      Left Hand AROM   L Index  MCP 0-90  80 Degrees    L Index PIP 0-100  95 Degrees    L Long  MCP 0-90  90 Degrees    L Long PIP 0-100  95 Degrees    L Ring  MCP 0-90  90 Degrees    L Ring PIP 0-100  95 Degrees    L Little  MCP 0-90  90 Degrees    L Little PIP 0-100  90 Degrees         Paraffin done to bilateral hands prior to review of HEP - decrease stiffness in digits and decrease pain per pt   Review HEP and hand out provided :   Moistheat 2 x day  AROM for thumb PA and RA slight pull - 10 reps  Joint protection principles - hand out provided and review  CMC neoprene splints  Fitted for pt to wear with activities that cause pain  Look into getting Paraffin bath to use at home           OT Education - 02/12/19 2033    Education Details  Findings of eval and HEP provided and review     Person(s)  Educated  Patient    Methods  Explanation;Demonstration;Handout    Comprehension  Verbalized understanding;Returned demonstration       OT Short Term Goals - 02/12/19 2048      OT SHORT TERM GOAL #1   Title  Pain decrease to less than 4/10 with act that involve small  pinch with twist or turn     Baseline  pain increase to 9/10 with  lat or 3/2 point pinch with twist ,or adduction of thumb and turn -     Time  3    Period  Weeks    Status  New    Target Date  03/05/19        OT Long Term Goals - 02/12/19 2050      OT LONG TERM GOAL #1   Title  Pt to be ind in HEP to decrease pain and maintain AROM and grip strength in bilateral hands     Baseline  pain increase to 9/10 - and now knowledge of HEP     Time  4    Period  Weeks    Target Date  03/12/19      OT LONG TERM GOAL #2   Title  Pt to verbalize 3 joint protection and modifications she has done to decrease pain and increase ease of using thumbs     Baseline  no knowledge     Time  4    Period  Weeks    Status  New    Target Date  03/12/19  Plan - 02/12/19 2036    Clinical Impression Statement  Pt present with diagnosis of bilateral thumb pain R hand worse than L - pt was negative for grinding test, Finkelstein , Phalens - pain and tenderness in R  worse than L -pain more in webspace and radiating down to radial wrist at times - appear pt has spasm and trigger point in  Polllicus brevis and ADDuctor - and maybe Pollicus Opponens - pt tender - and has pain moslty with pinch and lat piinch with twist or turn -Pt AROM in hand is WFL and grip /prehension strenght is above average for her age -  pt can benefit from dryneedling , modalities , soft tissue ,AROM and was fitted with bilateral neoprene CMC  splints to use with activiities that cause pain     OT Occupational Profile and History  Problem Focused Assessment - Including review of records relating to presenting problem    Occupational performance deficits  (Please refer to evaluation for details):  ADL's;IADL's;Play;Leisure    Body Structure / Function / Physical Skills  ADL;Flexibility;Muscle spasms;Strength;Pain;UE functional use    Rehab Potential  Good    Clinical Decision Making  Several treatment options, min-mod task modification necessary    Comorbidities Affecting Occupational Performance:  May have comorbidities impacting occupational performance   worked more than 20 yrs doing repetitive task with thumb    Modification or Assistance to Complete Evaluation   Min-Moderate modification of tasks or assist with assess necessary to complete eval    OT Frequency  1x / week    OT Duration  4 weeks    OT Treatment/Interventions  Self-care/ADL training;Paraffin;Therapeutic exercise;Ultrasound;Manual Therapy;Patient/family education;Splinting;Dry needling   PT will do dryneedling to thumb webspace    Plan  assess progress with HEP - and adjust as needed - soft tissue , ROM     OT Home Exercise Plan  See pt instruction     Consulted and Agree with Plan of Care  Patient       Patient will benefit from skilled therapeutic intervention in order to improve the following deficits and impairments:  Body Structure / Function / Physical Skills  Visit Diagnosis: Pain in right hand - Plan: Ot plan of care cert/re-cert  Pain in left hand - Plan: Ot plan of care cert/re-cert  Other muscle spasm - Plan: Ot plan of care cert/re-cert    Problem List Patient Active Problem List   Diagnosis Date Noted  . Bloating 11/19/2018  . Pain in both wrists 11/19/2018  . Screening for cardiovascular condition 11/19/2018  . HTN (hypertension) 11/17/2017  . Anxiety 11/17/2017  . Myofascial pain dysfunction syndrome 01/10/2017  . Thyroid nodule 07/14/2016  . Chronic low back pain 11/21/2014  . Osteopenia 11/21/2014  . Routine general medical examination at a health care facility 07/19/2013  . Atrophic vaginitis 07/16/2012  . Allergic rhinitis 07/16/2012     Rosalyn Gess OTR/l,CLT 02/12/2019, 8:56 PM  [facility] [address] [place], [address] Phone: [phone]   Fax:  [phone]  Name: Jill Michael MRN: [id number] Date of Birth: September 23, 1951

## 2019-02-12 NOTE — Patient Instructions (Signed)
Moistheat 2 x day  AROM for thumb PA and RA slight pull - 10 reps  Joint protection principles - hand out provided and review  CMC neoprene splints to be wore with activities that cause pain  Look into getting Paraffin bath to use at home

## 2019-02-14 ENCOUNTER — Ambulatory Visit: Payer: Medicare Other | Admitting: Occupational Therapy

## 2019-02-14 ENCOUNTER — Other Ambulatory Visit: Payer: Self-pay

## 2019-02-14 DIAGNOSIS — M79642 Pain in left hand: Secondary | ICD-10-CM

## 2019-02-14 DIAGNOSIS — M62838 Other muscle spasm: Secondary | ICD-10-CM

## 2019-02-14 DIAGNOSIS — M79641 Pain in right hand: Secondary | ICD-10-CM | POA: Diagnosis not present

## 2019-02-14 NOTE — Therapy (Signed)
[facility] [address] [place], [address] Phone: [phone]   Fax:  [phone]  Occupational Therapy Treatment  Patient Details  Name: Jill Michael MRN: [id number] Date of Birth: Feb 20, 1951 Referring Provider (OT): Poggi   Encounter Date: 02/14/2019  OT End of Session - 02/14/19 2114    Visit Number  2    Number of Visits  4    Date for OT Re-Evaluation  03/12/19    OT Start Time  1500    OT Stop Time  1535    OT Time Calculation (min)  35 min    Activity Tolerance  Patient tolerated treatment well    Behavior During Therapy  [facility] for tasks assessed/performed       Past Medical History:  Diagnosis Date  . Allergy    hay fever  . Arthritis   . Arthritis    right knee, followed by Jefm Bryant  . Chicken pox   . Low back pain    recurrent  . Osteopenia   . Scoliosis   . Scoliosis   . UTI (urinary tract infection)   . Vaginal atrophy     Past Surgical History:  Procedure Laterality Date  . BREAST BIOPSY Right 2000 and 2005   fibroadenoma right breast x2  . NASAL SEPTUM SURGERY    . partial knee replacement Right 2013   Dr. Jefm Bryant  . semithyroidectomy of right side  08/2106  . TUBAL LIGATION    . VAGINAL DELIVERY     2    There were no vitals filed for this visit.  Subjective Assessment - 02/14/19 2113    Subjective   The splnts helped a lot for my pain but the velcro bothered me - it feels like the R one is to small - but I can tell already difference in my pain     Patient Stated Goals  I want the pain better in my thumbs so I can do things like pinch , twist , turn knobs, grip when cutting , drive ,     Currently in Pain?  Yes    Pain Score  2     Pain Location  Hand    Pain Orientation  Right;Left    Pain Descriptors / Indicators  Aching    Pain Type  Chronic pain      Grip and prehension strength same as yesterday with less pain  Less pain with pinch and twist  simulation of some of her pain full tasks   Fitted with med Plus CMC neoprene on R hand and more comfortable  moldskin applied on edges of velcro on L one  - per pt felt much better  Ed on fastening them correctly   PTdone dryneedling in R webspace - Pollicus brevis and adductor - Followed by OT doing with pt  AROM for PA and RA afterwards - 10 reps  and opposion 5 reps  Tolerate well  Ice done at end of session                      OT Education - 02/14/19 2114    Education Details  joint protection principles     Person(s) Educated  Patient    Methods  Explanation;Demonstration;Handout    Comprehension  Verbalized understanding;Returned demonstration       OT Short Term Goals - 02/12/19 2048      OT SHORT TERM GOAL #1  Title  Pain decrease to less than 4/10 with act that involve small  pinch with twist or turn     Baseline  pain increase to 9/10 with  lat or 3/2 point pinch with twist ,or adduction of thumb and turn -     Time  3    Period  Weeks    Status  New    Target Date  03/05/19        OT Long Term Goals - 02/12/19 2050      OT LONG TERM GOAL #1   Title  Pt to be ind in HEP to decrease pain and maintain AROM and grip strength in bilateral hands     Baseline  pain increase to 9/10 - and now knowledge of HEP     Time  4    Period  Weeks    Target Date  03/12/19      OT LONG TERM GOAL #2   Title  Pt to verbalize 3 joint protection and modifications she has done to decrease pain and increase ease of using thumbs     Baseline  no knowledge     Time  4    Period  Weeks    Status  New    Target Date  03/12/19            Plan - 02/14/19 2115    Clinical Impression Statement  Pt this date of complains of CMC neoprene splint feeling to tight on R and velcro scratching in the L - strenght in grip and prehension still above average but less pain - fitted with larger on her R and moldskin on L done - Pt done some dryneedling in webspace - got  order from Dr Roland Rack - and pt had it done to other body parts before with success     OT Occupational Profile and History  Problem Focused Assessment - Including review of records relating to presenting problem    Occupational performance deficits (Please refer to evaluation for details):  ADL's;IADL's;Play;Leisure    Body Structure / Function / Physical Skills  ADL;Flexibility;Muscle spasms;Strength;Pain;UE functional use    Clinical Decision Making  Several treatment options, min-mod task modification necessary    Comorbidities Affecting Occupational Performance:  May have comorbidities impacting occupational performance    Modification or Assistance to Complete Evaluation   Min-Moderate modification of tasks or assist with assess necessary to complete eval    OT Frequency  1x / week    OT Duration  4 weeks    OT Treatment/Interventions  Self-care/ADL training;Paraffin;Therapeutic exercise;Ultrasound;Manual Therapy;Patient/family education;Splinting;Dry needling    Plan  assess progress with HEP - and adjust as needed - soft tissue , ROM     OT Home Exercise Plan  See pt instruction     Consulted and Agree with Plan of Care  Patient       Patient will benefit from skilled therapeutic intervention in order to improve the following deficits and impairments:  Body Structure / Function / Physical Skills  Visit Diagnosis: Pain in right hand  Pain in left hand  Other muscle spasm    Problem List Patient Active Problem List   Diagnosis Date Noted  . Bloating 11/19/2018  . Pain in both wrists 11/19/2018  . Screening for cardiovascular condition 11/19/2018  . HTN (hypertension) 11/17/2017  . Anxiety 11/17/2017  . Myofascial pain dysfunction syndrome 01/10/2017  . Thyroid nodule 07/14/2016  . Chronic low back pain 11/21/2014  . Osteopenia 11/21/2014  .  Routine general medical examination at a health care facility 07/19/2013  . Atrophic vaginitis 07/16/2012  . Allergic rhinitis  07/16/2012    Rosalyn Gess  OTR/l,CLT  02/14/2019, 9:19 PM  [facility] [address] [place], [address] Phone: [phone]   Fax:  [phone]  Name: Jill Michael MRN: [id number] Date of Birth: 07-10-1951

## 2019-02-20 ENCOUNTER — Ambulatory Visit: Payer: Medicare Other | Admitting: Occupational Therapy

## 2019-05-13 ENCOUNTER — Encounter: Payer: Self-pay | Admitting: Family

## 2019-05-13 ENCOUNTER — Ambulatory Visit
Admission: RE | Admit: 2019-05-13 | Discharge: 2019-05-13 | Disposition: A | Discharge status: Home / Self Care | Payer: Medicare Other | Source: Ambulatory Visit | Attending: Family | Admitting: Family

## 2019-05-13 ENCOUNTER — Other Ambulatory Visit: Payer: Self-pay

## 2019-05-13 ENCOUNTER — Ambulatory Visit (INDEPENDENT_AMBULATORY_CARE_PROVIDER_SITE_OTHER): Payer: Medicare Other | Admitting: Family

## 2019-05-13 DIAGNOSIS — R1032 Left lower quadrant pain: Secondary | ICD-10-CM | POA: Diagnosis not present

## 2019-05-13 DIAGNOSIS — R140 Abdominal distension (gaseous): Secondary | ICD-10-CM | POA: Diagnosis not present

## 2019-05-13 LAB — POCT I-STAT CREATININE: Creatinine, Ser: 0.6 mg/dL (ref 0.44–1.00)

## 2019-05-13 MED ORDER — IOHEXOL 300 MG/ML  SOLN
100.0000 mL | Freq: Once | INTRAMUSCULAR | Status: AC | PRN
  Administered 2019-05-13: 100 mL via INTRAVENOUS

## 2019-05-13 NOTE — Patient Instructions (Addendum)
Continue bland diet.   Pending STAT ( today) Ct Abdomen and Pelvis.  Today we discussed referrals, orders.  Dr Allen Norris   I have placed these orders in the system for you.  Please be sure to give [place] a call if you have not heard from our office regarding this. We should hear from [place] within ONE week with information regarding your appointment. If not, please let me know immediately.    Please let me know of ANY new or worsening symptoms.   Food Choices to Help Relieve Diarrhea, Adult When you have diarrhea, the foods you eat and your eating habits are very important. Choosing the right foods and drinks can help:  Relieve diarrhea.  Replace lost fluids and nutrients.  Prevent dehydration. What general guidelines should I follow?  Relieving diarrhea  Choose foods with less than 2 g or .07 oz. of fiber per serving.  Limit fats to less than 8 tsp (38 g or 1.34 oz.) a day.  Avoid the following: ? Foods and beverages sweetened with high-fructose corn syrup, honey, or sugar alcohols such as xylitol, sorbitol, and mannitol. ? Foods that contain a lot of fat or sugar. ? Fried, greasy, or spicy foods. ? High-fiber grains, breads, and cereals. ? Raw fruits and vegetables.  Eat foods that are rich in probiotics. These foods include dairy products such as yogurt and fermented milk products. They help increase healthy bacteria in the stomach and intestines (gastrointestinal tract, or GI tract).  If you have lactose intolerance, avoid dairy products. These may make your diarrhea worse.  Take medicine to help stop diarrhea (antidiarrheal medicine) only as told by your health care provider. Replacing nutrients  Eat small meals or snacks every 3-4 hours.  Eat bland foods, such as white rice, toast, or baked potato, until your diarrhea starts to get better. Gradually reintroduce nutrient-rich foods as tolerated or as told by your health care provider. This includes: ? Well-cooked protein  foods. ? Peeled, seeded, and soft-cooked fruits and vegetables. ? Low-fat dairy products.  Take vitamin and mineral supplements as told by your health care provider. Preventing dehydration  Start by sipping water or a special solution to prevent dehydration (oral rehydration solution, ORS). Urine that is clear or pale yellow means that you are getting enough fluid.  Try to drink at least 8-10 cups of fluid each day to help replace lost fluids.  You may add other liquids in addition to water, such as clear juice or decaffeinated sports drinks, as tolerated or as told by your health care provider.  Avoid drinks with caffeine, such as coffee, tea, or soft drinks.  Avoid alcohol. What foods are recommended?     The items listed may not be a complete list. Talk with your health care provider about what dietary choices are best for you. Grains White rice. White, [place], or pita breads (fresh or toasted), including plain rolls, buns, or bagels. White pasta. Saltine, soda, or graham crackers. Pretzels. Low-fiber cereal. Cooked cereals made with water (such as cornmeal, farina, or cream cereals). Plain muffins. Matzo. Melba toast. Zwieback. Vegetables Potatoes (without the skin). Most well-cooked and canned vegetables without skins or seeds. Tender lettuce. Fruits Apple sauce. Fruits canned in juice. Cooked apricots, cherries, grapefruit, peaches, pears, or plums. Fresh bananas and cantaloupe. Meats and other protein foods Baked or boiled chicken. Eggs. Tofu. Fish. Seafood. Smooth nut butters. Ground or well-cooked tender beef, ham, veal, lamb, pork, or poultry. Dairy Plain yogurt, kefir, and unsweetened liquid yogurt.  Lactose-free milk, buttermilk, skim milk, or soy milk. Low-fat or nonfat hard cheese. Beverages Water. Low-calorie sports drinks. Fruit juices without pulp. Strained tomato and vegetable juices. Decaffeinated teas. Sugar-free beverages not sweetened with sugar alcohols. Oral  rehydration solutions, if approved by your health care provider. Seasoning and other foods Bouillon, broth, or soups made from recommended foods. What foods are not recommended? The items listed may not be a complete list. Talk with your health care provider about what dietary choices are best for you. Grains Whole grain, whole wheat, bran, or rye breads, rolls, pastas, and crackers. Wild or brown rice. Whole grain or bran cereals. Barley. Oats and oatmeal. Corn tortillas or taco shells. Granola. Popcorn. Vegetables Raw vegetables. Fried vegetables. Cabbage, broccoli, Brussels sprouts, artichokes, baked beans, beet greens, corn, kale, legumes, peas, sweet potatoes, and yams. Potato skins. Cooked spinach and cabbage. Fruits Dried fruit, including raisins and dates. Raw fruits. Stewed or dried prunes. Canned fruits with syrup. Meat and other protein foods Fried or fatty meats. Deli meats. Chunky nut butters. Nuts and seeds. Beans and lentils. Berniece Salines. Hot dogs. Sausage. Dairy High-fat cheeses. Whole milk, chocolate milk, and beverages made with milk, such as milk shakes. Half-and-half. Cream. sour cream. Ice cream. Beverages Caffeinated beverages (such as coffee, tea, soda, or energy drinks). Alcoholic beverages. Fruit juices with pulp. Prune juice. Soft drinks sweetened with high-fructose corn syrup or sugar alcohols. High-calorie sports drinks. Fats and oils Butter. Cream sauces. Margarine. Salad oils. Plain salad dressings. Olives. Avocados. Mayonnaise. Sweets and desserts Sweet rolls, doughnuts, and sweet breads. Sugar-free desserts sweetened with sugar alcohols such as xylitol and sorbitol. Seasoning and other foods Honey. Hot sauce. Chili powder. Gravy. Cream-based or milk-based soups. Pancakes and waffles. Summary  When you have diarrhea, the foods you eat and your eating habits are very important.  Make sure you get at least 8-10 cups of fluid each day, or enough to keep your urine  clear or pale yellow.  Eat bland foods and gradually reintroduce healthy, nutrient-rich foods as tolerated, or as told by your health care provider.  Avoid high-fiber, fried, greasy, or spicy foods. This information is not intended to replace advice given to you by your health care provider. Make sure you discuss any questions you have with your health care provider. Document Released: 02/11/2004 Document Revised: 11/18/2016 Document Reviewed: 11/18/2016 Elsevier Interactive Patient Education  2019 [employer].

## 2019-05-13 NOTE — Assessment & Plan Note (Signed)
Acute on chronic. Patient well appearing today, non toxic in appearance. Concern with possible focal pain, LLQ, and discussed limitations of not having PE today.  She declines stool culture, GI pathogen panel, stool occult cards; she will discuss with Dr Allen Norris ( referral placed). She is agreeable to Stat Ct ab and pelvis. Will follow

## 2019-05-13 NOTE — Progress Notes (Signed)
This visit type was conducted due to national recommendations for restrictions regarding the COVID-19 pandemic (e.g. social distancing).  This format is felt to be most appropriate for this patient at this time.  All issues noted in this document were discussed and addressed.  No physical exam was performed (except for noted visual exam findings with Video Visits). Virtual Visit via Video Note  I connected with@  on 05/13/19 at  9:30 AM EDT by a video enabled telemedicine application and verified that I am speaking with the correct person using two identifiers.  Location patient: home Location provider:work Persons participating in the virtual visit: patient, provider  I discussed the limitations of evaluation and management by telemedicine and the availability of in person appointments. The patient expressed understanding and agreed to proceed.   HPI:  Complains of diarrhea and abdominal cramping for one year, waxes and wanes. Feeling better today.  Thinks recent flare may have been from rice krispies treats 2 days ago, and thinks contributory. Worried that she would get constipated and took fiber tablets 2 days. Stopped caffeine. Has been eating bland, such as baked potatoe, avocado, yogurt with canned pears. Drinking well, water and green tea  Notices that bending for exercise will cause her to  Have abdominal cramping.  Some tenderness 'sore ness' over LLQ.   Describes as stool initially 'rabbit pellets stool' then became clear to brown colored liquid. No blood seen in stool. No coffee ground stool.   Has lost approx 2 lbs gradually. Endorses bloating. NO fever, abdominal pain, cough.   No recent antibiotics, travel. Husband is not sick.   Worsened this past few months, continues to have 'flare up.'   In past had made dietary changes such as stopped drinking milk with improvement.  An episode of non bloody emesis, diarrhea and cramping 2 days ago. Diarrhea 3 times during that time.    Colonoscopy 2016- Dr Rayann Heman; states diverticulosis noted ( unable to see in Epic). No polyps.   ROS: See pertinent positives and negatives per HPI.  Past Medical History:  Diagnosis Date  . Allergy    hay fever  . Arthritis   . Arthritis    right knee, followed by Jefm Bryant  . Chicken pox   . Low back pain    recurrent  . Osteopenia   . Scoliosis   . Scoliosis   . UTI (urinary tract infection)   . Vaginal atrophy     Past Surgical History:  Procedure Laterality Date  . BREAST BIOPSY Right 2000 and 2005   fibroadenoma right breast x2  . NASAL SEPTUM SURGERY    . partial knee replacement Right 2013   Dr. Jefm Bryant  . semithyroidectomy of right side  08/2106  . TUBAL LIGATION    . VAGINAL DELIVERY     2    Family History  Problem Relation Age of Onset  . Cancer Mother        breast  . Breast cancer Mother 45  . Cancer Father        lung    SOCIAL HX: never smoker   Current Outpatient Medications:  .  amLODipine (NORVASC) 2.5 MG tablet, Take 1 tablet (2.5 mg total) by mouth daily., Disp: 90 tablet, Rfl: 3 .  Calcium-Magnesium-Vitamin D (CALCIUM 500 PO), Take by mouth., Disp: , Rfl:  .  Cholecalciferol (VITAMIN D) 2000 UNITS CAPS, Take 1 capsule by mouth daily., Disp: , Rfl:  .  cycloSPORINE (RESTASIS) 0.05 % ophthalmic emulsion, Place 1 drop  into both eyes 2 (two) times daily., Disp: 180 each, Rfl: 3 .  estradiol (ESTRACE) 0.1 MG/GM vaginal cream, Use intravaginally 1-3 times per week., Disp: 42.5 g, Rfl: 11 .  fexofenadine (ALLEGRA) 30 MG tablet, Take 30 mg 2 (two) times daily by mouth., Disp: , Rfl:  .  fluticasone (FLONASE) 50 MCG/ACT nasal spray, Place 2 sprays into both nostrils daily., Disp: 50 g, Rfl: 5 .  levothyroxine (SYNTHROID, LEVOTHROID) 25 MCG tablet, Take 1 tablet (25 mcg total) by mouth every morning., Disp: 90 tablet, Rfl: 3 .  silver sulfADIAZINE (SILVADENE) 1 % cream, Apply 1 application topically daily., Disp: 50 g, Rfl: 1 .  clobetasol cream  (TEMOVATE) 2.95 %, Apply 1 application topically at bedtime. External use only qhs x 1 week (Patient not taking: Reported on 02/12/2019), Disp: 45 g, Rfl: 0 .  flurandrenolide (CORDRAN) 0.05 % lotion, Apply topically daily as needed. Do not use on face. (Patient not taking: Reported on 05/13/2019), Disp: 60 mL, Rfl: 1 .  Hyaluronic Acid-Vitamin C (HYALURONIC ACID PO), Take 2 capsules by mouth daily., Disp: , Rfl:  .  omega-3 acid ethyl esters (LOVAZA) 1 g capsule, Take 2 capsules (2 g total) by mouth 2 (two) times daily. (Patient not taking: Reported on 02/12/2019), Disp: 120 capsule, Rfl: 11 .  OVER THE COUNTER MEDICATION, Take 3 capsules by mouth daily. Instaflex, Disp: , Rfl:  .  OVER THE COUNTER MEDICATION, Take 1 capsule by mouth 2 (two) times daily. Tumeric, Disp: , Rfl:  .  rosuvastatin (CRESTOR) 10 MG tablet, Take 1 tablet (10 mg total) by mouth daily. (Patient not taking: Reported on 02/12/2019), Disp: 90 tablet, Rfl: 3  EXAM:  VITALS per patient if applicable:  GENERAL: alert, oriented, appears well and in no acute distress  HEENT: atraumatic, conjunttiva clear, no obvious abnormalities on inspection of external nose and ears  NECK: normal movements of the head and neck  LUNGS: on inspection no signs of respiratory distress, breathing rate appears normal, no obvious gross SOB, gasping or wheezing  CV: no obvious cyanosis  MS: moves all visible extremities without noticeable abnormality  PSYCH/NEURO: pleasant and cooperative, no obvious depression or anxiety, speech and thought processing grossly intact  ASSESSMENT AND PLAN:  Discussed the following assessment and plan:  Bloating - Plan: Ambulatory referral to Gastroenterology  Left lower quadrant abdominal pain - Plan: CT ABDOMEN PELVIS W CONTRAST Bloating [R14.0]  Problem List Items Addressed This Visit      Other   Bloating - Primary    Acute on chronic. Patient well appearing today, non toxic in appearance. Concern with  possible focal pain, LLQ, and discussed limitations of not having PE today.  She declines stool culture, GI pathogen panel, stool occult cards; she will discuss with Dr Allen Norris ( referral placed). She is agreeable to Stat Ct ab and pelvis. Will follow      Relevant Orders   Ambulatory referral to Gastroenterology    Other Visit Diagnoses    Left lower quadrant abdominal pain       Relevant Orders   CT ABDOMEN PELVIS W CONTRAST        I discussed the assessment and treatment plan with the patient. The patient was provided an opportunity to ask questions and all were answered. The patient agreed with the plan and demonstrated an understanding of the instructions.   The patient was advised to call back or seek an in-person evaluation if the symptoms worsen or if the condition fails  to improve as anticipated.   Mable Paris, FNP

## 2019-05-16 ENCOUNTER — Encounter: Payer: Self-pay | Admitting: *Deleted

## 2019-06-14 ENCOUNTER — Telehealth: Payer: Self-pay | Admitting: Family

## 2019-06-14 NOTE — Telephone Encounter (Signed)
Left message for patient to call back and schedule Medicare Annual Wellness Visit (AWV) either virtually/audio   No hx; please schedule at anytime with Denisa O'Brien-Blaney at [facility] for [facility] to schedule

## 2019-06-21 ENCOUNTER — Ambulatory Visit (INDEPENDENT_AMBULATORY_CARE_PROVIDER_SITE_OTHER): Payer: Medicare Other

## 2019-06-21 ENCOUNTER — Other Ambulatory Visit: Payer: Self-pay

## 2019-06-21 DIAGNOSIS — Z0000 Encounter for general adult medical examination without abnormal findings: Secondary | ICD-10-CM | POA: Diagnosis not present

## 2019-06-21 NOTE — Progress Notes (Addendum)
Subjective:   Jill Michael is a 68 y.o. female who presents for an Initial Medicare Annual Wellness Visit.  Review of Systems    No ROS.  Medicare Wellness Virtual Visit.  Visual/audio telehealth visit, UTA vital signs.   See social history for additional risk factors.    Cardiac Risk Factors include: advanced age (>55men, >63 women);hypertension     Objective:    Today's Vitals   There is no height or weight on file to calculate BMI.  Advanced Directives 06/21/2019 12/16/2015 08/17/2015 06/10/2015  Does Patient Have a Medical Advance Directive? No No No No  Would patient like information on creating a medical advance directive? No - Patient declined No - patient declined information No - patient declined information No - patient declined information    Current Medications (verified) Outpatient Encounter Medications as of 06/21/2019  Medication Sig  . amLODipine (NORVASC) 2.5 MG tablet Take 1 tablet (2.5 mg total) by mouth daily.  . Cholecalciferol (VITAMIN D) 2000 UNITS CAPS Take 1 capsule by mouth daily.  . cycloSPORINE (RESTASIS) 0.05 % ophthalmic emulsion Place 1 drop into both eyes 2 (two) times daily.  Marland Kitchen estradiol (ESTRACE) 0.1 MG/GM vaginal cream Use intravaginally 1-3 times per week.  . fexofenadine (ALLEGRA) 30 MG tablet Take 30 mg 2 (two) times daily by mouth.  . flurandrenolide (CORDRAN) 0.05 % lotion Apply topically daily as needed. Do not use on face.  . fluticasone (FLONASE) 50 MCG/ACT nasal spray Place 2 sprays into both nostrils daily.  Marland Kitchen Hyaluronic Acid-Vitamin C (HYALURONIC ACID PO) Take 2 capsules by mouth daily.  Marland Kitchen levothyroxine (SYNTHROID, LEVOTHROID) 25 MCG tablet Take 1 tablet (25 mcg total) by mouth every morning.  . Menaquinone-7 (VITAMIN K2 PO) Take 1 capsule by mouth daily.  Marland Kitchen OVER THE COUNTER MEDICATION Take 3 capsules by mouth daily. Instaflex  . OVER THE COUNTER MEDICATION Take 1 capsule by mouth 2 (two) times daily. Tumeric  . silver  sulfADIAZINE (SILVADENE) 1 % cream Apply 1 application topically daily.  . [DISCONTINUED] vitamin k 100 MCG tablet Take 100 mcg by mouth daily.  . [DISCONTINUED] Calcium-Magnesium-Vitamin D (CALCIUM 500 PO) Take by mouth.  . [DISCONTINUED] clobetasol cream (TEMOVATE) 6.96 % Apply 1 application topically at bedtime. External use only qhs x 1 week (Patient not taking: Reported on 02/12/2019)  . [DISCONTINUED] omega-3 acid ethyl esters (LOVAZA) 1 g capsule Take 2 capsules (2 g total) by mouth 2 (two) times daily. (Patient not taking: Reported on 02/12/2019)  . [DISCONTINUED] rosuvastatin (CRESTOR) 10 MG tablet Take 1 tablet (10 mg total) by mouth daily. (Patient not taking: Reported on 02/12/2019)   No facility-administered encounter medications on file as of 06/21/2019.     Allergies (verified) Penicillin v potassium and Penicillins   History: Past Medical History:  Diagnosis Date  . Allergy    hay fever  . Arthritis   . Arthritis    right knee, followed by Jefm Bryant  . Chicken pox   . Low back pain    recurrent  . Osteopenia   . Scoliosis   . Scoliosis   . UTI (urinary tract infection)   . Vaginal atrophy    Past Surgical History:  Procedure Laterality Date  . BREAST BIOPSY Right 2000 and 2005   fibroadenoma right breast x2  . NASAL SEPTUM SURGERY    . partial knee replacement Right 2013   Dr. Jefm Bryant  . semithyroidectomy of right side  08/2106  . TUBAL LIGATION    .  VAGINAL DELIVERY     2   Family History  Problem Relation Age of Onset  . Cancer Mother        breast  . Breast cancer Mother 63  . Cancer Father        lung   Social History   Socioeconomic History  . Marital status: Married    Spouse name: Not on file  . Number of children: Not on file  . Years of education: Not on file  . Highest education level: Not on file  Occupational History  . Not on file  Social Needs  . Financial resource strain: Not hard at all  . Food insecurity    Worry: Never true     Inability: Never true  . Transportation needs    Medical: No    Non-medical: No  Tobacco Use  . Smoking status: Never Smoker  . Smokeless tobacco: Never Used  Substance and Sexual Activity  . Alcohol use: Yes    Comment: occasional  . Drug use: Yes  . Sexual activity: Not on file  Lifestyle  . Physical activity    Days per week: 3 days    Minutes per session: 40 min  . Stress: Not at all  Relationships  . Social Herbalist on phone: Not on file    Gets together: Not on file    Attends religious service: Not on file    Active member of club or organization: Not on file    Attends meetings of clubs or organizations: Not on file    Relationship status: Not on file  Other Topics Concern  . Not on file  Social History Narrative   Lives in [place]. Moved from [place] in [place], [employer]. Plans to retire 09/25/16. She won her appeal for LT disability (12/2016).          Diet - regular, healthy; swims 4-5x per week   Exercise - none, walked previously    Tobacco Counseling Counseling given: Not Answered   Clinical Intake:  Pre-visit preparation completed: Yes        Diabetes: No  How often do you need to have someone help you when you read instructions, pamphlets, or other written materials from your doctor or pharmacy?: 1 - Never  Interpreter Needed?: No      Activities of Daily Living In your present state of health, do you have any difficulty performing the following activities: 06/21/2019  Hearing? N  Vision? N  Difficulty concentrating or making decisions? N  Walking or climbing stairs? N  Dressing or bathing? N  Doing errands, shopping? N  Preparing Food and eating ? N  Using the Toilet? N  In the past six months, have you accidently leaked urine? Y  Comment Managed with daily liner  Do you have problems with loss of bowel control? N  Managing your Medications? N  Managing your Finances? N  Housekeeping or  managing your Housekeeping? N  Some recent data might be hidden   Immunizations and Health Maintenance Immunization History  Administered Date(s) Administered  . Influenza, High Dose Seasonal PF 08/19/2017, 08/19/2018  . Influenza-Unspecified 08/29/2014, 10/19/2015, 09/07/2016  . Pneumococcal Conjugate-13 11/17/2017  . Pneumococcal Polysaccharide-23 11/19/2018  . Tdap 07/16/2010  . Zoster 04/10/2015   There are no preventive care reminders to display for this patient.  Patient Care Team: Burnard Hawthorne, FNP as PCP - General (Family Medicine)  Indicate any recent  Medical Services you may have received from other than [facility] providers in the past year (date may be approximate).     Assessment:   This is a routine wellness examination for Jill Michael.  I connected with patient 06/21/19 at 10:00 AM EDT by a video/audio enabled telemedicine application and verified that I am speaking with the correct person using two identifiers. Patient stated full name and DOB. Patient gave permission to continue with virtual visit. Patient's location was at home and Nurse's location was at [place] office.   Patients requests lipid panel if medicare will cover. Discontinued crestor and lovaza per patient preference. She has been taking OTC fish oil 1400 and has been monitoring her diet and exercise.   Health Screenings  Mammogram - 12/2018 Colonoscopy - 02/2015 Bone Density - 09/2017 Glaucoma -none Hearing -demonstrates normal hearing during visit. TSH- 11/2018 Cholesterol - 11/2018  Dental- visits every 6 months Vision- visits within the last 12 months.  Chiropractor- visits every 3 weeks  Sleeping well through the night  Social  Alcohol intake - yes      Smoking history- never   Smokers in home? none Illicit drug use? none Exercise - Physical therapy q other week, walking q other day 40 min  Diet - Dairy free excluding yogurt. Healthy. BMI- discussed the importance of a healthy diet, water  intake and the benefits of aerobic exercise.  Educational material provided.   Safety  Patient feels safe at home- yes Patient does have smoke detectors at home- yes Patient does wear sunscreen or protective clothing when in direct sunlight -yes Patient does wear seat belt when in a moving vehicle -yes Patient drives- yes Walkways free from clutter, throw rugs, and extension cords etc- yes Handrails In use when available- yes Adequate lighting in the home- yes  Covid-19 precautions and sickness symptoms discussed.   Activities of Daily Living Patient denies needing assistance with: driving, household chores, feeding themselves, getting from bed to chair, getting to the toilet, bathing/showering, dressing, managing money, or preparing meals.  No new identified risk were noted.    Depression Screen Patient denies losing interest in daily life, feeling hopeless, or crying easily over simple problems. Acupuncture when feeling down helps. Visits every 2 weeks.   Medication-Patient discontinued calcium magnesium vit D, clobetasol cream, crestor 10mg , lovaza 1g. Taking all other medications as directed and without issues.   Fall Screen Patient denies being afraid of falling or falling in the last year.   Memory Screen Patient is alert.  Patient denies difficulty focusing, concentrating or misplacing items. Correctly identified the president of the [place], season and recall. Patient likes to read and use the computer for research for brain stimulation.  Immunizations The following Immunizations were discussed: Influenza, shingles, pneumonia, and tetanus.   Other Providers Patient Care Team: Burnard Hawthorne, FNP as PCP - General (Family Medicine)   Hearing/Vision screen  Hearing Screening   125Hz  250Hz  500Hz  1000Hz  2000Hz  3000Hz  4000Hz  6000Hz  8000Hz   Right ear:           Left ear:           Comments: Patient is able to hear conversational tones without difficulty.  No issues  reported.  Vision Screening Comments: Wears corrective lenses Visual acuity not assessed, virtual visit.  They have seen their ophthalmologist in the last 12 months.    Dietary issues and exercise activities discussed: Current Exercise Habits: Home exercise routine, Type of exercise: walking(Physical therapy), Time (Minutes): 40, Frequency (Times/Week): 3, Weekly  Exercise (Minutes/Week): 120, Intensity: Mild  Goals      Patient Stated   . Follow up with Primary Care Provider (pt-stated)     Keep routine maintenance appointments  Eat healthy      Depression Screen PHQ 2/9 Scores 06/21/2019 04/17/2017 01/10/2017 09/13/2016 12/16/2015 11/23/2015 07/19/2013  PHQ - 2 Score 0 0 0 0 0 0 0    Fall Risk Fall Risk  06/21/2019 04/17/2017 01/10/2017 09/13/2016 12/16/2015  Falls in the past year? 0 No No No No  Number falls in past yr: - - - - -  Comment - - - - -  Injury with Fall? - - - - -   Cognitive Function:     6CIT Screen 06/21/2019  What Year? 0 points  What month? 0 points  What time? 0 points  Count back from 20 0 points  Months in reverse 0 points  Repeat phrase 0 points  Total Score 0    Screening Tests Health Maintenance  Topic Date Due  . INFLUENZA VACCINE  07/06/2019  . TETANUS/TDAP  07/16/2020  . MAMMOGRAM  12/26/2020  . COLONOSCOPY  02/05/2025  . DEXA SCAN  Completed  . Hepatitis C Screening  Completed  . PNA vac Low Risk Adult  Completed       Plan:   End of life planning; Advanced aging; Advanced directives discussed.  No HCPOA/Living Will.  Additional information declined at this time.  I have personally reviewed and noted the following in the patient's chart:   . Medical and social history . Use of alcohol, tobacco or illicit drugs  . Current medications and supplements . Functional ability and status . Nutritional status . Physical activity . Advanced directives . List of other physicians . Hospitalizations, surgeries, and ER visits in previous 12  months . Vitals . Screenings to include cognitive, depression, and falls . Referrals and appointments  In addition, I have reviewed and discussed with patient certain preventive protocols, quality metrics, and best practice recommendations. A written personalized care plan for preventive services as well as general preventive health recommendations were provided to patient.     Varney Biles, LPN   [id number]/[id number]    Agree with plan. Mable Paris, NP

## 2019-06-21 NOTE — Patient Instructions (Addendum)
  Ms. Dimmitt , Thank you for taking time to come for your Medicare Wellness Visit. I appreciate your ongoing commitment to your health goals. Please review the following plan we discussed and let me know if I can assist you in the future.   These are the goals we discussed: Goals      Patient Stated   . Follow up with Primary Care Provider (pt-stated)     Keep routine maintenance appointments  Eat healthy       This is a list of the screening recommended for you and due dates:  Health Maintenance  Topic Date Due  . Flu Shot  07/06/2019  . Tetanus Vaccine  07/16/2020  . Mammogram  12/26/2020  . Colon Cancer Screening  02/05/2025  . DEXA scan (bone density measurement)  Completed  .  Hepatitis C: One time screening is recommended by Center for Disease Control  (CDC) for  adults born from [birth year] through 1964.   Completed  . Pneumonia vaccines  Completed

## 2019-06-25 ENCOUNTER — Encounter: Payer: Self-pay | Admitting: Gastroenterology

## 2019-06-25 ENCOUNTER — Ambulatory Visit (INDEPENDENT_AMBULATORY_CARE_PROVIDER_SITE_OTHER): Payer: Medicare Other | Admitting: Gastroenterology

## 2019-06-25 ENCOUNTER — Other Ambulatory Visit: Payer: Self-pay

## 2019-06-25 VITALS — BP 134/75 | HR 112 | Temp 98.1°F | Ht 65.5 in | Wt 155.6 lb

## 2019-06-25 DIAGNOSIS — K582 Mixed irritable bowel syndrome: Secondary | ICD-10-CM

## 2019-06-25 DIAGNOSIS — M47812 Spondylosis without myelopathy or radiculopathy, cervical region: Secondary | ICD-10-CM | POA: Insufficient documentation

## 2019-06-25 DIAGNOSIS — M5136 Other intervertebral disc degeneration, lumbar region: Secondary | ICD-10-CM | POA: Insufficient documentation

## 2019-06-25 DIAGNOSIS — G5620 Lesion of ulnar nerve, unspecified upper limb: Secondary | ICD-10-CM | POA: Insufficient documentation

## 2019-06-25 DIAGNOSIS — R202 Paresthesia of skin: Secondary | ICD-10-CM | POA: Insufficient documentation

## 2019-06-25 MED ORDER — HYOSCYAMINE SULFATE 0.125 MG SL SUBL: 0.1250 mg | SUBLINGUAL_TABLET | SUBLINGUAL | 3 refills | Status: DC | PRN

## 2019-06-25 NOTE — Progress Notes (Signed)
Gastroenterology Consultation  Referring Provider:     Burnard Hawthorne, FNP Primary Care Physician:  Burnard Hawthorne, FNP Primary Gastroenterologist:  Dr. Allen Norris     Reason for Consultation:     Bloating        HPI:   Jill Michael is a 68 y.o. y/o female referred for consultation & management of bloating by Dr. Vidal Schwalbe, Yvetta Coder, FNP.  This patient comes in today after being seen in the past by the [facility] clinic and now was sent to be evaluated by me for bloating.  The patient reports that she had a colonoscopy in March 2016 that was totally normal.  After that she found out that her brother had a history of colon polyps that was precancerous. The patient reports that after eating dinner about a year and a half ago with corn cake she had a illness which resulted in diarrhea.  Since then the patient has had many issues with her bowels including alternating diarrhea and constipation with bloating.  There is no report of any black stools or bloody stools.  She also denies any nausea vomiting or hematemesis.  He also reports of multiple foods that did not bother her before are now bothering her and can occur many hours after she eats them.  Prior to this illness about a year ago she had no problems with her bowels  Past Medical History:  Diagnosis Date  . Allergy    hay fever  . Arthritis   . Arthritis    right knee, followed by Jefm Bryant  . Chicken pox   . Low back pain    recurrent  . Osteopenia   . Scoliosis   . Scoliosis   . UTI (urinary tract infection)   . Vaginal atrophy     Past Surgical History:  Procedure Laterality Date  . BREAST BIOPSY Right 2000 and 2005   fibroadenoma right breast x2  . NASAL SEPTUM SURGERY    . partial knee replacement Right 2013   Dr. Jefm Bryant  . semithyroidectomy of right side  08/2106  . TUBAL LIGATION    . VAGINAL DELIVERY     2    Prior to Admission medications   Medication Sig Start Date End Date Taking? Authorizing Provider   amLODipine (NORVASC) 2.5 MG tablet Take 1 tablet (2.5 mg total) by mouth daily. 11/19/18   Burnard Hawthorne, FNP  Cholecalciferol (VITAMIN D) 2000 UNITS CAPS Take 1 capsule by mouth daily.    [provider]  cycloSPORINE (RESTASIS) 0.05 % ophthalmic emulsion Place 1 drop into both eyes 2 (two) times daily. 12/19/16   Burnard Hawthorne, FNP  dexamethasone (DECADRON) 0.5 MG/5ML solution See admin instructions. 06/13/19   [provider]  estradiol (ESTRACE) 0.1 MG/GM vaginal cream Use intravaginally 1-3 times per week. 08/08/18   Burnard Hawthorne, FNP  fexofenadine (ALLEGRA) 30 MG tablet Take 30 mg 2 (two) times daily by mouth.    [provider]  flurandrenolide (CORDRAN) 0.05 % lotion Apply topically daily as needed. Do not use on face. 12/19/16   Burnard Hawthorne, FNP  fluticasone (FLONASE) 50 MCG/ACT nasal spray Place 2 sprays into both nostrils daily. 11/19/18   Burnard Hawthorne, FNP  Hyaluronic Acid-Vitamin C (HYALURONIC ACID PO) Take 2 capsules by mouth daily.    [provider]  levothyroxine (SYNTHROID, LEVOTHROID) 25 MCG tablet Take 1 tablet (25 mcg total) by mouth every morning. 11/23/18   Burnard Hawthorne, FNP  Menaquinone-7 (VITAMIN K2 PO) Take 1 capsule by mouth daily.    [provider]  OVER THE COUNTER MEDICATION Take 3 capsules by mouth daily. Instaflex    [provider]  OVER THE COUNTER MEDICATION Take 1 capsule by mouth 2 (two) times daily. Tumeric    [provider]  silver sulfADIAZINE (SILVADENE) 1 % cream Apply 1 application topically daily. 11/19/18   Burnard Hawthorne, FNP    Family History  Problem Relation Age of Onset  . Cancer Mother        breast  . Breast cancer Mother 81  . Cancer Father        lung     Social History   Tobacco Use  . Smoking status: Never Smoker  . Smokeless tobacco: Never Used  Substance Use Topics  . Alcohol use: Yes    Comment: occasional  . Drug use: Yes     Allergies as of 06/25/2019 - Review Complete 06/21/2019  Allergen Reaction Noted  . Penicillin v potassium Rash 05/19/2015  . Penicillins Hives and Rash 07/16/2012    Review of Systems:    All systems reviewed and negative except where noted in HPI.   Physical Exam:  There were no vitals taken for this visit. No LMP recorded. Patient is postmenopausal. General:   Alert,  Well-developed, well-nourished, pleasant and cooperative in NAD Head:  Normocephalic and atraumatic. Eyes:  Sclera clear, no icterus.   Conjunctiva pink. Ears:  Normal auditory acuity. Nose:  No deformity, discharge, or lesions. Mouth:  No deformity or lesions,oropharynx pink & moist. Neck:  Supple; no masses or thyromegaly. Lungs:  Respirations even and unlabored.  Clear throughout to auscultation.   No wheezes, crackles, or rhonchi. No acute distress. Heart:  Regular rate and rhythm; no murmurs, clicks, rubs, or gallops. Abdomen:  Normal bowel sounds.  No bruits.  Soft, non-tender and non-distended without masses, hepatosplenomegaly or hernias noted.  No guarding or rebound tenderness.  Negative Carnett sign.   Rectal:  Deferred.  Msk:  Symmetrical without gross deformities.  Good, equal movement & strength bilaterally. Pulses:  Normal pulses noted. Extremities:  No clubbing or edema.  No cyanosis. Neurologic:  Alert and oriented x3;  grossly normal neurologically. Skin:  Intact without significant lesions or rashes.  No jaundice. Lymph Nodes:  No significant cervical adenopathy. Psych:  Alert and cooperative. Normal mood and affect.  Imaging Studies: No results found.  Assessment and Plan:   Jill Michael is a 68 y.o. y/o female who comes in with a history of alternating diarrhea and constipation with bloating since having a diarrheal illness over a year ago.  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  She also reports that the symptoms are worse with stress.  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  The patient has been explained the pathophysiology of this and states that it makes sense.  The patient has been told to try and increase fiber in her diet and to take probiotics.  The patient has also been told to avoid gassy foods.  Due to her brother's history of colon polyps she has been told that she needs a repeat colonoscopy in 2021.  The patient will also be given hyoscyamine for her abdominal cramps that she gets intermittently with her attacks of irritable bowel syndrome.  The patient has been explained the plan and agrees with it.  Lucilla Lame, MD. Marval Regal  Note: This dictation was prepared with Dragon dictation along with smaller phrase technology. Any transcriptional errors that result from this process are unintentional.

## 2019-06-28 ENCOUNTER — Other Ambulatory Visit: Payer: Self-pay | Admitting: Family

## 2019-07-23 ENCOUNTER — Other Ambulatory Visit: Payer: Self-pay

## 2019-07-24 ENCOUNTER — Ambulatory Visit (INDEPENDENT_AMBULATORY_CARE_PROVIDER_SITE_OTHER): Payer: Medicare Other | Admitting: Family

## 2019-07-24 ENCOUNTER — Encounter: Payer: Self-pay | Admitting: Family

## 2019-07-24 VITALS — BP 132/70 | HR 97 | Temp 97.3°F | Ht 65.35 in | Wt 157.6 lb

## 2019-07-24 DIAGNOSIS — R140 Abdominal distension (gaseous): Secondary | ICD-10-CM

## 2019-07-24 DIAGNOSIS — Z87898 Personal history of other specified conditions: Secondary | ICD-10-CM

## 2019-07-24 DIAGNOSIS — J0100 Acute maxillary sinusitis, unspecified: Secondary | ICD-10-CM | POA: Diagnosis not present

## 2019-07-24 DIAGNOSIS — E785 Hyperlipidemia, unspecified: Secondary | ICD-10-CM

## 2019-07-24 MED ORDER — PREDNISONE 10 MG PO TABS: ORAL_TABLET | ORAL | 0 refills | Status: DC

## 2019-07-24 MED ORDER — AZITHROMYCIN 250 MG PO TABS: ORAL_TABLET | ORAL | 0 refills | Status: DC

## 2019-07-24 NOTE — Progress Notes (Signed)
Subjective:    Patient ID: Jill Michael, female    DOB: 1951-06-22, 68 y.o.   MRN: [id number]  CC: Jill Michael is a 68 y.o. female who presents today for an acute visit.    HPI: Chief complaint of right ear pain x3 days, waxing and waning.   left ear is feeling "stuffy".  She does endorse left maxillary sinus tenderness, nasal congestion, posterior drip, hoarseness, "dull" headache.  She denies any fever, cough, shortness of breath, sore throat, changes in taste or smell.  She does have a history of allergies however she had not been taking  Allegra, Mucinex, Flonase regularly until the past 3 days. In the past she feels that antibiotic and prednisone has been helpful.   history of eustachian tube dysfunction. She states that her diarrhea and bloating has resolved on probiotic.  She was seen by Dr. 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 She also states that she does not want to start statin at this time ; she would like to pursue lifestyle changes and have this rechecked.   HISTORY:  Past Medical History:  Diagnosis Date  . Allergy    hay fever  . Arthritis   . Arthritis    right knee, followed by Jefm Bryant  . Chicken pox   . Low back pain    recurrent  . Osteopenia   . Scoliosis   . Scoliosis   . UTI (urinary tract infection)   . Vaginal atrophy    Past Surgical History:  Procedure Laterality Date  . BREAST BIOPSY Right 2000 and 2005   fibroadenoma right breast x2  . NASAL SEPTUM SURGERY    . partial knee replacement Right 2013   Dr. Jefm Bryant  . semithyroidectomy of right side  08/2106  . TUBAL LIGATION    . VAGINAL DELIVERY     2   Family History  Problem Relation Age of Onset  . Cancer Mother        breast  . Breast cancer Mother 7  . Cancer Father        lung    Allergies: Penicillin v potassium and Penicillins Current Outpatient Medications on File Prior to Visit  Medication Sig Dispense Refill  . amLODipine  (NORVASC) 2.5 MG tablet Take 1 tablet (2.5 mg total) by mouth daily. 90 tablet 3  . Cholecalciferol (VITAMIN D) 2000 UNITS CAPS Take 1 capsule by mouth daily.    . cycloSPORINE (RESTASIS) 0.05 % ophthalmic emulsion Place 1 drop into both eyes 2 (two) times daily. 180 each 3  . dexamethasone (DECADRON) 0.5 MG/5ML solution See admin instructions.    Marland Kitchen estradiol (ESTRACE) 0.1 MG/GM vaginal cream Use intravaginally 1-3 times per week. 42.5 g 11  . fexofenadine (ALLEGRA) 30 MG tablet Take 30 mg 2 (two) times daily by mouth.    . flurandrenolide (CORDRAN) 0.05 % lotion Apply topically daily as needed. Do not use on face. 60 mL 1  . fluticasone (FLONASE) 50 MCG/ACT nasal spray Place 2 sprays into both nostrils daily. 50 g 5  . hyoscyamine (LEVSIN SL) 0.125 MG SL tablet Place 1 tablet (0.125 mg total) under the tongue every 4 (four) hours as needed. 30 tablet 3  . levothyroxine (SYNTHROID, LEVOTHROID) 25 MCG tablet Take 1 tablet (25 mcg total) by mouth every morning. 90 tablet 3  . OVER THE COUNTER MEDICATION Take 3 capsules by mouth daily. Instaflex    . OVER THE COUNTER MEDICATION Take 1 capsule  by mouth 2 (two) times daily. Tumeric    . silver sulfADIAZINE (SILVADENE) 1 % cream Apply 1 application topically daily. 50 g 1  . FLUZONE HIGH-DOSE QUADRIVALENT 0.7 ML SUSY TO BE ADMINISTERED BY PHARMACIST FOR IMMUNIZATION    . Hyaluronic Acid-Vitamin C (HYALURONIC ACID PO) Take 2 capsules by mouth daily.    . Menaquinone-7 (VITAMIN K2 PO) Take 1 capsule by mouth daily.     No current facility-administered medications on file prior to visit.     Social History   Tobacco Use  . Smoking status: Never Smoker  . Smokeless tobacco: Never Used  Substance Use Topics  . Alcohol use: Yes    Comment: occasional  . Drug use: Yes    Review of Systems  Constitutional: Negative for chills and fever.  HENT: Positive for congestion, ear pain, postnasal drip, sinus pain and voice change. Negative for ear  discharge, facial swelling, sore throat, tinnitus and trouble swallowing.   Respiratory: Negative for cough and shortness of breath.   Cardiovascular: Negative for chest pain and palpitations.  Gastrointestinal: Negative for nausea and vomiting.      Objective:    BP 132/70 (BP Location: Left Arm, Patient Position: Sitting, Cuff Size: Normal)   Pulse 97   Temp (!) 97.3 F (36.3 C) (Temporal)   Ht 5' 5.35" (1.66 m)   Wt 157 lb 9.6 oz (71.5 kg)   SpO2 94%   BMI 25.94 kg/m    Physical Exam Vitals signs reviewed.  Constitutional:      Appearance: She is well-developed.  HENT:     Head: Normocephalic and atraumatic.     Right Ear: Hearing, ear canal and external ear normal. No decreased hearing noted. No drainage, swelling or tenderness. No middle ear effusion. No foreign body. Tympanic membrane is bulging. Tympanic membrane is not erythematous.     Left Ear: Hearing, tympanic membrane, ear canal and external ear normal. No decreased hearing noted. No drainage, swelling or tenderness.  No middle ear effusion. No foreign body. Tympanic membrane is not erythematous or bulging.     Nose: No rhinorrhea.     Right Sinus: No maxillary sinus tenderness or frontal sinus tenderness.     Left Sinus: Maxillary sinus tenderness (mild; no edema, erythema, rash.) present. No frontal sinus tenderness.     Mouth/Throat:     Pharynx: Uvula midline. No oropharyngeal exudate or posterior oropharyngeal erythema.     Tonsils: No tonsillar abscesses.  Eyes:     Conjunctiva/sclera: Conjunctivae normal.  Cardiovascular:     Rate and Rhythm: Regular rhythm.     Pulses: Normal pulses.     Heart sounds: Normal heart sounds.  Pulmonary:     Effort: Pulmonary effort is normal.     Breath sounds: Normal breath sounds. No wheezing, rhonchi or rales.  Lymphadenopathy:     Head:     Right side of head: No submental, submandibular, tonsillar, preauricular, posterior auricular or occipital adenopathy.     Left  side of head: No submental, submandibular, tonsillar, preauricular, posterior auricular or occipital adenopathy.     Cervical: No cervical adenopathy.  Skin:    General: Skin is warm and dry.  Neurological:     Mental Status: She is alert.  Psychiatric:        Speech: Speech normal.        Behavior: Behavior normal.        Thought Content: Thought content normal.        Assessment &  Plan:   Problem List Items Addressed This Visit      Respiratory   Sinusitis - Primary    Suspect acute sinusitis, discussed whether viral, allergic or bacterial.  Patient nontoxic in appearance.  based on duration of symptoms and tenderness on exam, will go ahead and start Z-Pak that she is PCN allergic.  Patient has often used prednisone for similar symptoms in the past, advised that she may use prednisone if after completion of antibiotic she feels that she needs.  She will let me know how she is doing      Relevant Medications   azithromycin (ZITHROMAX) 250 MG tablet   predniSONE (DELTASONE) 10 MG tablet     Other   Bloating    Resolved after probiotics.      HLD (hyperlipidemia)    Advised and she declines statin therapy at this time.  Continue lifestyle changes.           I am having [name]. Furrow start on azithromycin and predniSONE. I am also having her maintain her Vitamin D, OVER THE COUNTER MEDICATION, Hyaluronic Acid-Vitamin C (HYALURONIC ACID PO), OVER THE COUNTER MEDICATION, cycloSPORINE, flurandrenolide, fexofenadine, estradiol, amLODipine, fluticasone, silver sulfADIAZINE, levothyroxine, Menaquinone-7 (VITAMIN K2 PO), dexamethasone, hyoscyamine, and Fluzone High-Dose Quadrivalent.   Meds ordered this encounter  Medications  . azithromycin (ZITHROMAX) 250 MG tablet    Sig: Take 2 tablets ( total 500 mg) PO on day 1, then take 1 tablet ( total 250 mg) by mouth q24h x 4 days.    Dispense:  6 tablet    Refill:  0    Order Specific Question:   Supervising Provider    Answer:    Derrel Nip, TERESA L [[id number]]  . predniSONE (DELTASONE) 10 MG tablet    Sig: Take 40 mg by mouth on day 1, then taper 10 mg daily until gone    Dispense:  10 tablet    Refill:  0    Order Specific Question:   Supervising Provider    Answer:   Crecencio Mc [[id number]]    Return precautions given.   Risks, benefits, and alternatives of the medications and treatment plan prescribed today were discussed, and patient expressed understanding.   Education regarding symptom management and diagnosis given to patient on AVS.  Continue to follow with Burnard Hawthorne, FNP for routine health maintenance.   Shahed June Roehrs and I agreed with plan.   Mable Paris, FNP

## 2019-07-24 NOTE — Patient Instructions (Addendum)
Start Zpak  If after completion you feel that you are having ear pain, you may then start prednisone taper; ensure you that you take all prednisone prior to 1pm each day as it can interfere with sleep  Continue probiotics  See you in December!   Sinusitis, Adult Sinusitis is soreness and swelling (inflammation) of your sinuses. Sinuses are hollow spaces in the bones around your face. They are located:  Around your eyes.  In the middle of your forehead.  Behind your nose.  In your cheekbones. Your sinuses and nasal passages are lined with a fluid called mucus. Mucus drains out of your sinuses. Swelling can trap mucus in your sinuses. This lets germs (bacteria, virus, or fungus) grow, which leads to infection. Most of the time, this condition is caused by a virus. What are the causes? This condition is caused by:  Allergies.  Asthma.  Germs.  Things that block your nose or sinuses.  Growths in the nose (nasal polyps).  Chemicals or irritants in the air.  Fungus (rare). What increases the risk? You are more likely to develop this condition if:  You have a weak body defense system (immune system).  You do a lot of swimming or diving.  You use nasal sprays too much.  You smoke. What are the signs or symptoms? The main symptoms of this condition are pain and a feeling of pressure around the sinuses. Other symptoms include:  Stuffy nose (congestion).  Runny nose (drainage).  Swelling and warmth in the sinuses.  Headache.  Toothache.  A cough that may get worse at night.  Mucus that collects in the throat or the back of the nose (postnasal drip).  Being unable to smell and taste.  Being very tired (fatigue).  A fever.  Sore throat.  Bad breath. How is this diagnosed? This condition is diagnosed based on:  Your symptoms.  Your medical history.  A physical exam.  Tests to find out if your condition is short-term (acute) or long-term (chronic).  Your doctor may: ? Check your nose for growths (polyps). ? Check your sinuses using a tool that has a light (endoscope). ? Check for allergies or germs. ? Do imaging tests, such as an MRI or CT scan. How is this treated? Treatment for this condition depends on the cause and whether it is short-term or long-term.  If caused by a virus, your symptoms should go away on their own within 10 days. You may be given medicines to relieve symptoms. They include: ? Medicines that shrink swollen tissue in the nose. ? Medicines that treat allergies (antihistamines). ? A spray that treats swelling of the nostrils. ? Rinses that help get rid of thick mucus in your nose (nasal saline washes).  If caused by bacteria, your doctor may wait to see if you will get better without treatment. You may be given antibiotic medicine if you have: ? A very bad infection. ? A weak body defense system.  If caused by growths in the nose, you may need to have surgery. Follow these instructions at home: Medicines  Take, use, or apply over-the-counter and prescription medicines only as told by your doctor. These may include nasal sprays.  If you were prescribed an antibiotic medicine, take it as told by your doctor. Do not stop taking the antibiotic even if you start to feel better. Hydrate and humidify   Drink enough water to keep your pee (urine) pale yellow.  Use a cool mist humidifier to keep the  humidity level in your home above 50%.  Breathe in steam for 10-15 minutes, 3-4 times a day, or as told by your doctor. You can do this in the bathroom while a hot shower is running.  Try not to spend time in cool or dry air. Rest  Rest as much as you can.  Sleep with your head raised (elevated).  Make sure you get enough sleep each night. General instructions   Put a warm, moist washcloth on your face 3-4 times a day, or as often as told by your doctor. This will help with discomfort.  Wash your hands often  with soap and water. If there is no soap and water, use hand sanitizer.  Do not smoke. Avoid being around people who are smoking (secondhand smoke).  Keep all follow-up visits as told by your doctor. This is important. Contact a doctor if:  You have a fever.  Your symptoms get worse.  Your symptoms do not get better within 10 days. Get help right away if:  You have a very bad headache.  You cannot stop throwing up (vomiting).  You have very bad pain or swelling around your face or eyes.  You have trouble seeing.  You feel confused.  Your neck is stiff.  You have trouble breathing. Summary  Sinusitis is swelling of your sinuses. Sinuses are hollow spaces in the bones around your face.  This condition is caused by tissues in your nose that become inflamed or swollen. This traps germs. These can lead to infection.  If you were prescribed an antibiotic medicine, take it as told by your doctor. Do not stop taking it even if you start to feel better.  Keep all follow-up visits as told by your doctor. This is important. This information is not intended to replace advice given to you by your health care provider. Make sure you discuss any questions you have with your health care provider. Document Released: 05/09/2008 Document Revised: 04/23/2018 Document Reviewed: 04/23/2018 Elsevier Patient Education  2020 [employer].

## 2019-07-24 NOTE — Assessment & Plan Note (Signed)
Suspect acute sinusitis, discussed whether viral, allergic or bacterial.  Patient nontoxic in appearance.  based on duration of symptoms and tenderness on exam, will go ahead and start Z-Pak that she is PCN allergic.  Patient has often used prednisone for similar symptoms in the past, advised that she may use prednisone if after completion of antibiotic she feels that she needs.  She will let me know how she is doing

## 2019-07-24 NOTE — Assessment & Plan Note (Signed)
Advised and she declines statin therapy at this time.  Continue lifestyle changes.

## 2019-07-24 NOTE — Assessment & Plan Note (Signed)
Resolved after probiotics.

## 2019-07-26 ENCOUNTER — Ambulatory Visit: Payer: Self-pay | Admitting: *Deleted

## 2019-07-26 DIAGNOSIS — J019 Acute sinusitis, unspecified: Secondary | ICD-10-CM

## 2019-07-26 MED ORDER — DOXYCYCLINE HYCLATE 100 MG PO TABS: 100.0000 mg | ORAL_TABLET | Freq: Two times a day (BID) | ORAL | 0 refills | Status: DC

## 2019-07-26 NOTE — Telephone Encounter (Signed)
Call pt  Sorry about the diarrhea! This sounds uncomfortable   I probably suggest if it something that is been consistent has been multiple episodes that she try different antibiotic.    All antibiotics can cause loose stools however  Please emphasized the role of probiotics.  I have called in doxycycline which is alternative for sinusitis and ear infection since she is penicillin allergic.  She may start this medication but certainly if diarrhea does not resolve, please let us know or seek immediate medical attention over weekend.   Please add zpak to allergies with diarrhea

## 2019-07-26 NOTE — Telephone Encounter (Signed)
Summary: medication side effect.   Pt states that she has been taking azithromycin (ZITHROMAX) 250 MG tablet for three days and is having cramping and diarrhea. Pt needs to know if she needs to stay on the medication and take something for diarrhea or if she needs to discontinue medication       Reason for Disposition . [1] Caller has URGENT medication question about med that PCP or specialist prescribed AND [2] triager unable to answer question  Answer Assessment - Initial Assessment Questions 1.   NAME of MEDICATION: "What medicine are you calling about?"     Azithromycin 2.   QUESTION: "What is your question?"     Patient was seen for sinus infection and was given Z pack. Patient states she is having some intestinal cramping - today is 3rd day and diahrrea. 3.   PRESCRIBING HCP: "Who prescribed it?" Reason: if prescribed by specialist, call should be referred to that group.     M. Arnett 4. SYMPTOMS: "Do you have any symptoms?"     Stomach cramping and diarrhea has started 5. SEVERITY: If symptoms are present, ask "Are they mild, moderate or severe?"     moderate 6.  PREGNANCY:  "Is there any chance that you are pregnant?" "When was your last menstrual period?"     n/a  Protocols used: MEDICATION QUESTION CALL-A-AH

## 2019-07-26 NOTE — Telephone Encounter (Signed)
I spoke with patient & advised on below. She stated that she would start new antibiotic tomorrow. She will let us know if diarrhea persists & if symptoms get worse over the weekend I advised UC.

## 2019-07-31 ENCOUNTER — Ambulatory Visit (INDEPENDENT_AMBULATORY_CARE_PROVIDER_SITE_OTHER): Payer: Medicare Other | Admitting: Family

## 2019-07-31 ENCOUNTER — Encounter: Payer: Self-pay | Admitting: Family

## 2019-07-31 ENCOUNTER — Telehealth: Payer: Self-pay

## 2019-07-31 DIAGNOSIS — J019 Acute sinusitis, unspecified: Secondary | ICD-10-CM | POA: Diagnosis not present

## 2019-07-31 DIAGNOSIS — Z13820 Encounter for screening for osteoporosis: Secondary | ICD-10-CM

## 2019-07-31 NOTE — Progress Notes (Signed)
Verbal consent for services obtained from patient prior to services given to TELEPHONE visit:   Location of call:  provider at work patient at home  Names of all persons present for services: Mable Paris, NP Chief complaint:   Continues to have right ear pain, worse last night however improvement today with allegra.   Zpak helped with ha , left maxillary tenderness, sinus congestion which has resolved. On allegra, mucinex.  Stopped zpak after 3 days due to diarrhea, since resolved.  Started doxycycline 5 days ago without any change/improvement. Has one more dose to take. No fever, ear discharge.      A/P/next steps: Problem List Items Addressed This Visit      Respiratory   Sinusitis    Discussed with patient that likely bacterial component has resolved. Also discussed concern for diarrheal infections with multiple antibiotics. She will complete doxycycline. Suspect residual inflammation and we discussed starting prednisone. She will let me know.           I spent 10 min  discussing plan of care over the phone.

## 2019-07-31 NOTE — Telephone Encounter (Signed)
Spoke with patient after seeing mychart message. She is scheduled today in [place] appointment slot.

## 2019-07-31 NOTE — Assessment & Plan Note (Signed)
Discussed with patient that likely bacterial component has resolved. Also discussed concern for diarrheal infections with multiple antibiotics. She will complete doxycycline. Suspect residual inflammation and we discussed starting prednisone. She will let me know.

## 2019-08-22 ENCOUNTER — Ambulatory Visit (INDEPENDENT_AMBULATORY_CARE_PROVIDER_SITE_OTHER): Payer: Medicare Other | Admitting: Family Medicine

## 2019-08-22 ENCOUNTER — Other Ambulatory Visit: Payer: Self-pay

## 2019-08-22 VITALS — BP 138/76 | HR 101 | Temp 95.6°F | Resp 18 | Ht 65.0 in | Wt 156.4 lb

## 2019-08-22 DIAGNOSIS — N751 Abscess of Bartholin's gland: Secondary | ICD-10-CM | POA: Diagnosis not present

## 2019-08-22 MED ORDER — SULFAMETHOXAZOLE-TRIMETHOPRIM 800-160 MG PO TABS: 1.0000 | ORAL_TABLET | Freq: Two times a day (BID) | ORAL | 0 refills | Status: DC

## 2019-08-22 NOTE — Progress Notes (Signed)
Subjective:    Patient ID: Jill Michael, female    DOB: 1951-02-13, 68 y.o.   MRN: [id number]  HPI   Patient presents to clinic complaining of bumps in vaginal area.  Last Pap smear done in 2018 that was negative for any abnormality & HPV not detected.  Patient had her husband look at the area last night, states he described as a pimple-like bump.  Patient put salve on the area before going to bed.  Upon awakening this morning, areas seem to burst on its own and had some pus.  States after birth, pressure in the area seemed a lot better.  No fever or chills.  Denies vaginal discharge or abnormal vaginal bleeding.  Patient Active Problem List   Diagnosis Date Noted  . HLD (hyperlipidemia) 07/24/2019  . Degeneration of lumbar intervertebral disc 06/25/2019  . Lesion of ulnar nerve 06/25/2019  . Paresthesia 06/25/2019  . Primary localized osteoarthrosis, hand 12/17/2018  . Bloating 11/19/2018  . Pain in both wrists 11/19/2018  . Screening for cardiovascular condition 11/19/2018  . HTN (hypertension) 11/17/2017  . Anxiety 11/17/2017  . Myofascial pain dysfunction syndrome 01/10/2017  . Multinodular goiter 08/19/2016  . Thyroid nodule 07/14/2016  . Sinusitis 06/15/2016  . Chronic low back pain 11/21/2014  . Osteopenia 11/21/2014  . Status post right unicompartmental knee replacement 11/14/2014  . Routine general medical examination at a health care facility 07/19/2013  . Atrophic vaginitis 07/16/2012  . Allergic rhinitis 07/16/2012  . Scoliosis 06/21/1986   Social History   Tobacco Use  . Smoking status: Never Smoker  . Smokeless tobacco: Never Used  Substance Use Topics  . Alcohol use: Yes    Comment: occasional   Review of Systems  Constitutional: Negative for chills, fatigue and fever.  HENT: Negative for congestion, ear pain, sinus pain and sore throat.   Eyes: Negative.   Respiratory: Negative for cough, shortness of breath and wheezing.   Cardiovascular:  Negative for chest pain, palpitations and leg swelling.  Gastrointestinal: Negative for abdominal pain, diarrhea, nausea and vomiting.  Genitourinary: +bump in vaginal area.  Musculoskeletal: Negative for arthralgias and myalgias.  Skin: Negative for color change, pallor and rash.  Neurological: Negative for syncope, light-headedness and headaches.  Psychiatric/Behavioral: The patient is not nervous/anxious.       Objective:   Physical Exam Vitals signs and nursing note reviewed.  Constitutional:      General: She is not in acute distress.    Appearance: She is not ill-appearing, toxic-appearing or diaphoretic.  HENT:     Head: Normocephalic and atraumatic.  Cardiovascular:     Rate and Rhythm: Normal rate and regular rhythm.     Heart sounds: Normal heart sounds.  Pulmonary:     Effort: Pulmonary effort is normal. No respiratory distress.     Breath sounds: Normal breath sounds.  Genitourinary:      Comments: Cyst location represented by red circle on diagram.  It has opened and begun to drain on its own.  Suspect a Bartholin gland cyst.  Differential diagnosis could also be a infected ingrown hair.  Vaginal vault is normal.  No abnormal discharge or bleeding noted. Neurological:     Mental Status: She is alert and oriented to person, place, and time.     Gait: Gait normal.  Psychiatric:        Mood and Affect: Mood normal.        Behavior: Behavior normal.     Today's Vitals  08/22/19 1131  BP: 138/76  Pulse: (!) 101  Resp: 18  Temp: (!) 95.6 F (35.3 C)  TempSrc: Temporal  SpO2: 96%  Weight: 156 lb 6.4 oz (70.9 kg)  Height: 5\' 5"  (1.651 m)   Body mass index is 26.03 kg/m.     Assessment & Plan:    1. Bartholin's gland abscess Due to location of on patient's body, I have strong suspicion for infected Bartholin gland cyst.  We will treat with Bactrim twice daily to cover infection.  We will also refer to GYN for further evaluation and to see if area  requires any sort of surgical intervention.  - sulfamethoxazole-trimethoprim (BACTRIM DS) 800-160 MG tablet; Take 1 tablet by mouth 2 (two) times daily.  Dispense: 20 tablet; Refill: 0 - Ambulatory referral to Gynecology   Patient will keep regular follow-ups with PCP as planned.  She will call office or return to clinic sooner if any issues arise.

## 2019-08-22 NOTE — Patient Instructions (Signed)
Bartholin's Cyst  A Bartholin's cyst is a fluid-filled sac that forms on a Bartholin's gland. Bartholin's glands are small glands in the folds of skin around the opening of the vagina (labia). This type of cyst causes a bulge or lump near the lower opening of the vagina. If you have a cyst that is small and not infected, you may be able to take care of it at home. If your cyst gets infected, it may cause pain and your doctor may need to drain it. An infected Bartholin's cyst is called a Bartholin's abscess. Follow these instructions at home: Medicines  Take over-the-counter and prescription medicines only as told by your doctor.  If you were prescribed an antibiotic medicine, take it as told by your doctor. Do not stop taking the antibiotic even if you start to feel better. Managing pain and swelling  Try sitz baths to help with pain and swelling. A sitz bath is a warm water bath in which the water only comes up to your hips and should cover your buttocks. You may take sitz baths a few times a day.  Put heat on the affected area as often as needed. Use the heat source that your doctor recommends, such as a moist heat pack or a heating pad. ? Place a towel between your skin and the heat source. ? Leave the heat on for 20-30 minutes. ? Remove the heat if your skin turns bright red. This is especially important if you cannot feel pain, heat, or cold. You may have a greater risk of getting burned. General instructions  If your cyst or abscess was drained: ? Follow instructions from your doctor about how to take care of your wound. ? Use feminine pads to absorb any fluid.  Do not push on or squeeze your cyst.  Do not have sex until the cyst has gone away or your wound from drainage has healed.  Take these steps to help prevent a Bartholin's cyst from returning, and to prevent other Bartholin's cysts from forming: ? Take a bath or shower once a day. Clean your vaginal area with mild soap and  water when you bathe. ? Practice safe sex to prevent STIs (sexually transmitted infections). Talk with your doctor about how to prevent STIs and which forms of birth control (contraception) may be best for you.  Keep all follow-up visits as told by your doctor. This is important. Contact a doctor if:  You have a fever.  You get redness, swelling, or pain around your cyst.  You have fluid, blood, pus, or a bad smell coming from your cyst.  You have a cyst that gets larger or comes back. Summary  A Bartholin's cyst is a fluid-filled sac that forms on a Bartholin's gland. These small glands are found in the folds of skin around the opening of the vagina (labia).  This type of cyst causes a bulge or lump near the lower opening of the vagina. An infected Bartholin's cyst is called a Bartholin's abscess.  Try sitz baths a few times a day to help with pain and swelling.  Do not push on or squeeze your cyst. This information is not intended to replace advice given to you by your health care provider. Make sure you discuss any questions you have with your health care provider. Document Released: 02/17/2009 Document Revised: 09/13/2018 Document Reviewed: 08/23/2017 Elsevier Patient Education  2020 [employer]  

## 2019-08-23 ENCOUNTER — Encounter: Payer: Self-pay | Admitting: Family Medicine

## 2019-08-26 ENCOUNTER — Encounter: Payer: Self-pay | Admitting: Family Medicine

## 2019-08-27 ENCOUNTER — Telehealth: Payer: Self-pay | Admitting: Obstetrics & Gynecology

## 2019-08-27 NOTE — Telephone Encounter (Signed)
[facility] referring for Bartholin's gland abscess. Called and left voicemail for patient to call back to be schedule

## 2019-08-29 NOTE — Telephone Encounter (Signed)
Called and left voice mail for patient to call back to be schedule °

## 2019-09-02 ENCOUNTER — Ambulatory Visit (INDEPENDENT_AMBULATORY_CARE_PROVIDER_SITE_OTHER): Payer: Medicare Other | Admitting: Obstetrics and Gynecology

## 2019-09-02 ENCOUNTER — Other Ambulatory Visit: Payer: Self-pay

## 2019-09-02 ENCOUNTER — Encounter: Payer: Self-pay | Admitting: Obstetrics and Gynecology

## 2019-09-02 VITALS — BP 132/88 | Ht 66.0 in | Wt 159.0 lb

## 2019-09-02 DIAGNOSIS — N751 Abscess of Bartholin's gland: Secondary | ICD-10-CM | POA: Diagnosis not present

## 2019-09-02 NOTE — Progress Notes (Signed)
Obstetrics & Gynecology Office Visit   Chief Complaint  Patient presents with  . Bartholin's Cyst  The patient is seen in referral at the request of Arnett, Yvetta Coder, FNP and Philis Nettle, FNP, from [facility] for a Bartholin's Gland Cyst.  History of Present Illness: 68 y.o. menopausal female who is seen in referral at the request of Arnett, Yvetta Coder, FNP, and Verta Ellen, FNP,  from [facility] for a Bartholin's Gland Cyst.  About 2 weeks ago she noticed a soreness in her right labium majus.  The swelling became worse and even painful.  About 11 days ago she presented to her PCP with swelling on the right side.  That morning the area had ruptured and drained pus.  She was started on Bactrim for a 10-day course, which she completed yesterday.  The area feels much better today.  Notably she has a history of lichen planus for which she sees a dermatologist.  Her dermatologist has also looked in her vaginal area to make sure she has no skin changes on her labia and vulvar area.  She takes estradiol cream twice weekly for atrophic vaginitis.  She denies fevers and chills and any other symptoms.  Last pap smear 2018: negative, negative HPV  Past Medical History:  Diagnosis Date  . Allergy    hay fever  . Arthritis   . Arthritis    right knee, followed by Jefm Bryant  . Chicken pox   . Low back pain    recurrent  . Osteopenia   . Scoliosis   . Scoliosis   . UTI (urinary tract infection)   . Vaginal atrophy     Past Surgical History:  Procedure Laterality Date  . BREAST BIOPSY Right 2000 and 2005   fibroadenoma right breast x2  . NASAL SEPTUM SURGERY    . partial knee replacement Right 2013   Dr. Jefm Bryant  . semithyroidectomy of right side  08/2106  . TUBAL LIGATION    . VAGINAL DELIVERY     2    Gynecologic History: No LMP recorded. Patient is postmenopausal.  Obstetric History: No obstetric history on file.  Family History  Problem Relation Age of Onset   . Cancer Mother        breast  . Breast cancer Mother 54  . Cancer Father        lung    Social History   Socioeconomic History  . Marital status: Married    Spouse name: Not on file  . Number of children: Not on file  . Years of education: Not on file  . Highest education level: Not on file  Occupational History  . Not on file  Social Needs  . Financial resource strain: Not hard at all  . Food insecurity    Worry: Never true    Inability: Never true  . Transportation needs    Medical: No    Non-medical: No  Tobacco Use  . Smoking status: Never Smoker  . Smokeless tobacco: Never Used  Substance and Sexual Activity  . Alcohol use: Yes    Comment: occasional  . Drug use: Yes  . Sexual activity: Not Currently    Birth control/protection: Post-menopausal  Lifestyle  . Physical activity    Days per week: 3 days    Minutes per session: 40 min  . Stress: Not at all  Relationships  . Social Herbalist on phone: Not on file    Gets together:  Not on file    Attends religious service: Not on file    Active member of club or organization: Not on file    Attends meetings of clubs or organizations: Not on file    Relationship status: Not on file  . Intimate partner violence    Fear of current or ex partner: No    Emotionally abused: No    Physically abused: No    Forced sexual activity: No  Other Topics Concern  . Not on file  Social History Narrative   Lives in [place]. Moved from [place] in [employer], [employer]. Plans to retire 09/25/16. She won her appeal for LT disability (12/2016).          Diet - regular, healthy; swims 4-5x per week   Exercise - none, walked previously    Allergies  Allergen Reactions  . Penicillin V Potassium Rash  . Penicillins Hives and Rash    Prior to Admission medications   Medication Sig Start Date End Date Taking? Authorizing Provider  amLODipine (NORVASC) 2.5 MG tablet Take 1 tablet (2.5 mg total)  by mouth daily. 11/19/18   Burnard Hawthorne, FNP  azithromycin (ZITHROMAX) 250 MG tablet Take 2 tablets ( total 500 mg) PO on day 1, then take 1 tablet ( total 250 mg) by mouth q24h x 4 days. 07/24/19   Burnard Hawthorne, FNP  Cholecalciferol (VITAMIN D) 2000 UNITS CAPS Take 1 capsule by mouth daily.    [provider]  cycloSPORINE (RESTASIS) 0.05 % ophthalmic emulsion Place 1 drop into both eyes 2 (two) times daily. 12/19/16   Burnard Hawthorne, FNP  dexamethasone (DECADRON) 0.5 MG/5ML solution See admin instructions. 06/13/19   [provider]  doxycycline (VIBRA-TABS) 100 MG tablet Take 1 tablet (100 mg total) by mouth 2 (two) times daily. 07/26/19   Burnard Hawthorne, FNP  estradiol (ESTRACE) 0.1 MG/GM vaginal cream Use intravaginally 1-3 times per week. 08/08/18   Burnard Hawthorne, FNP  fexofenadine (ALLEGRA) 30 MG tablet Take 30 mg 2 (two) times daily by mouth.    [provider]  flurandrenolide (CORDRAN) 0.05 % lotion Apply topically daily as needed. Do not use on face. 12/19/16   Burnard Hawthorne, FNP  fluticasone (FLONASE) 50 MCG/ACT nasal spray Place 2 sprays into both nostrils daily. 11/19/18   Burnard Hawthorne, FNP  FLUZONE HIGH-DOSE QUADRIVALENT 0.7 ML SUSY TO BE ADMINISTERED BY PHARMACIST FOR IMMUNIZATION 07/19/19   [provider]  Hyaluronic Acid-Vitamin C (HYALURONIC ACID PO) Take 2 capsules by mouth daily.    [provider]  hyoscyamine (LEVSIN SL) 0.125 MG SL tablet Place 1 tablet (0.125 mg total) under the tongue every 4 (four) hours as needed. 06/25/19   Lucilla Lame, MD  levothyroxine (SYNTHROID, LEVOTHROID) 25 MCG tablet Take 1 tablet (25 mcg total) by mouth every morning. 11/23/18   Burnard Hawthorne, FNP  Menaquinone-7 (VITAMIN K2 PO) Take 1 capsule by mouth daily.    [provider]  OVER THE COUNTER MEDICATION Take 3 capsules by mouth daily. Instaflex    [provider]  OVER THE COUNTER MEDICATION Take 1  capsule by mouth 2 (two) times daily. Tumeric    [provider]  predniSONE (DELTASONE) 10 MG tablet Take 40 mg by mouth on day 1, then taper 10 mg daily until gone 07/24/19   Burnard Hawthorne, FNP  silver sulfADIAZINE (SILVADENE) 1 % cream Apply 1 application topically  daily. 11/19/18   Burnard Hawthorne, FNP  sulfamethoxazole-trimethoprim (BACTRIM DS) 800-160 MG tablet Take 1 tablet by mouth 2 (two) times daily. 08/22/19   Jodelle Green, FNP    Review of Systems  Constitutional: Negative.   HENT: Negative.   Eyes: Negative.   Respiratory: Negative.   Cardiovascular: Negative.   Gastrointestinal: Positive for diarrhea. Negative for abdominal pain, blood in stool, constipation, heartburn, melena, nausea and vomiting.  Genitourinary: Negative.   Musculoskeletal: Positive for joint pain. Negative for back pain, falls, myalgias and neck pain.       Muscle weakness  Skin: Negative.        See HPI  Neurological: Negative.   Psychiatric/Behavioral: Negative for depression, hallucinations, memory loss, substance abuse and suicidal ideas. The patient is nervous/anxious. The patient does not have insomnia.      Physical Exam BP 132/88   Ht 5\' 6"  (1.676 m)   Wt 159 lb (72.1 kg)   BMI 25.66 kg/m  No LMP recorded. Patient is postmenopausal. Physical Exam Constitutional:      General: She is not in acute distress.    Appearance: Normal appearance. She is well-developed.  Genitourinary:     Pelvic exam was performed with patient in the lithotomy position.     Vulva, inguinal canal, urethra, bladder, vagina, uterus, right adnexa and left adnexa normal.     No vulval lesion or Bartholin's cyst noted.     No posterior fourchette tenderness, injury or lesion present.        No cervical friability, lesion, bleeding or polyp.  HENT:     Head: Normocephalic and atraumatic.  Eyes:     General: No scleral icterus.    Conjunctiva/sclera: Conjunctivae normal.  Neck:      Musculoskeletal: Normal range of motion and neck supple.  Cardiovascular:     Rate and Rhythm: Normal rate and regular rhythm.     Heart sounds: No murmur. No friction rub. No gallop.   Pulmonary:     Effort: Pulmonary effort is normal. No respiratory distress.     Breath sounds: Normal breath sounds. No wheezing or rales.  Abdominal:     General: Bowel sounds are normal. There is no distension.     Palpations: Abdomen is soft. There is no mass.     Tenderness: There is no abdominal tenderness. There is no guarding or rebound.  Musculoskeletal: Normal range of motion.  Neurological:     General: No focal deficit present.     Mental Status: She is alert and oriented to person, place, and time.     Cranial Nerves: No cranial nerve deficit.  Skin:    General: Skin is warm and dry.     Findings: No erythema.  Psychiatric:        Mood and Affect: Mood normal.        Behavior: Behavior normal.        Judgment: Judgment normal.  Vitals signs reviewed. Exam conducted with a chaperone present.     Female chaperone present for pelvic and breast  portions of the physical exam  Assessment: 68 y.o. No obstetric history on file. female here for  1. Bartholin's gland abscess      Plan: Problem List Items Addressed This Visit    None    Visit Diagnoses    Bartholin's gland abscess    -  Primary     Normal findings on exam today.  The Bartholin's gland abscess appears to have resolved by  itself.  Also, the antibiotics which she is taking are also probably contributing to the resolution of the abscess.  There is a likelihood that the abscess might return.  If so, I instructed the patient to make an appointment as soon as possible with me so that I could see it while it is active.  We discussed that in a woman who is menopausal a Bartholin's gland cyst or abscess can be an indicator of malignancy.  However, there is no evidence of that on exam today.  The patient may follow-up as needed.   Otherwise, she may return to routine care.  30 minutes spent in face to face discussion with > 50% spent in counseling,management, and coordination of care of her Bartholin's Gland Abscess.   Prentice Docker, MD 09/02/2019 8:19 AM     CC: Burnard Hawthorne, [address] [place],  [place] [id number]

## 2019-09-03 ENCOUNTER — Encounter: Payer: Self-pay | Admitting: Obstetrics and Gynecology

## 2019-09-05 ENCOUNTER — Encounter: Payer: Self-pay | Admitting: Family

## 2019-09-09 ENCOUNTER — Other Ambulatory Visit: Payer: Self-pay

## 2019-09-09 ENCOUNTER — Emergency Department
Admission: EM | Admit: 2019-09-09 | Discharge: 2019-09-09 | Disposition: A | Discharge status: Home / Self Care | Payer: Medicare Other | Attending: Emergency Medicine | Admitting: Emergency Medicine

## 2019-09-09 ENCOUNTER — Emergency Department: Payer: Medicare Other

## 2019-09-09 DIAGNOSIS — R0789 Other chest pain: Secondary | ICD-10-CM | POA: Insufficient documentation

## 2019-09-09 DIAGNOSIS — I10 Essential (primary) hypertension: Secondary | ICD-10-CM | POA: Diagnosis not present

## 2019-09-09 DIAGNOSIS — R079 Chest pain, unspecified: Secondary | ICD-10-CM | POA: Diagnosis present

## 2019-09-09 DIAGNOSIS — Z79899 Other long term (current) drug therapy: Secondary | ICD-10-CM | POA: Insufficient documentation

## 2019-09-09 DIAGNOSIS — R0602 Shortness of breath: Secondary | ICD-10-CM | POA: Diagnosis not present

## 2019-09-09 DIAGNOSIS — I447 Left bundle-branch block, unspecified: Secondary | ICD-10-CM | POA: Insufficient documentation

## 2019-09-09 LAB — CBC WITH DIFFERENTIAL/PLATELET
Abs Immature Granulocytes: 0.01 10*3/uL (ref 0.00–0.07)
Basophils Absolute: 0.1 10*3/uL (ref 0.0–0.1)
Basophils Relative: 1 %
Eosinophils Absolute: 0.1 10*3/uL (ref 0.0–0.5)
Eosinophils Relative: 2 %
HCT: 44 % (ref 36.0–46.0)
Hemoglobin: 15.1 g/dL — ABNORMAL HIGH (ref 12.0–15.0)
Immature Granulocytes: 0 %
Lymphocytes Relative: 35 %
Lymphs Abs: 2 10*3/uL (ref 0.7–4.0)
MCH: 32.1 pg (ref 26.0–34.0)
MCHC: 34.3 g/dL (ref 30.0–36.0)
MCV: 93.4 fL (ref 80.0–100.0)
Monocytes Absolute: 0.5 10*3/uL (ref 0.1–1.0)
Monocytes Relative: 9 %
Neutro Abs: 2.9 10*3/uL (ref 1.7–7.7)
Neutrophils Relative %: 53 %
Platelets: 281 10*3/uL (ref 150–400)
RBC: 4.71 MIL/uL (ref 3.87–5.11)
RDW: 11.9 % (ref 11.5–15.5)
WBC: 5.5 10*3/uL (ref 4.0–10.5)
nRBC: 0 % (ref 0.0–0.2)

## 2019-09-09 LAB — TROPONIN I (HIGH SENSITIVITY)
Troponin I (High Sensitivity): 7 ng/L (ref <18)
Troponin I (High Sensitivity): 6 ng/L (ref <18)

## 2019-09-09 LAB — BASIC METABOLIC PANEL
Anion gap: 9 (ref 5–15)
BUN: 16 mg/dL (ref 8–23)
CO2: 26 mmol/L (ref 22–32)
Calcium: 9.2 mg/dL (ref 8.9–10.3)
Chloride: 101 mmol/L (ref 98–111)
Creatinine, Ser: 0.58 mg/dL (ref 0.44–1.00)
GFR calc Af Amer: >60 mL/min (ref >60)
GFR calc non Af Amer: >60 mL/min (ref >60)
Glucose, Bld: 101 mg/dL — ABNORMAL HIGH (ref 70–99)
Potassium: 3.5 mmol/L (ref 3.5–5.1)
Sodium: 136 mmol/L (ref 135–145)

## 2019-09-09 LAB — BRAIN NATRIURETIC PEPTIDE: B Natriuretic Peptide: 60 pg/mL (ref 0.0–100.0)

## 2019-09-09 NOTE — ED Notes (Signed)
Pt assisted to toilet at this time

## 2019-09-09 NOTE — ED Provider Notes (Signed)
[facility] Emergency Department Provider Note   ____________________________________________   First MD Initiated Contact with Patient 09/09/19 [phone]     (approximate)  I have reviewed the triage vital signs and the nursing notes.   HISTORY  Chief Complaint Chest Pain    HPI Jill Michael is a 68 y.o. female with past medical history of tension and hyperlipidemia who presents the ED complaining of chest tightness.  Patient reports she has been feeling intermittent tightness in her chest over about the past week.  She states it is relatively mild and was not thinking about it much until today, but it did not seem to go away.  She also describes some mild shortness of breath, present constantly.  Her symptoms are not associated with exertion and she denies any fevers or cough.  She does report a history of anxiety and symptoms seem to worsen when she is feeling more anxious.  She denies any pain or swelling in her legs, has not had any recent surgery or chemotherapy.        Past Medical History:  Diagnosis Date  . Allergy    hay fever  . Arthritis   . Arthritis    right knee, followed by Jefm Bryant  . Chicken pox   . Low back pain    recurrent  . Osteopenia   . Scoliosis   . Scoliosis   . UTI (urinary tract infection)   . Vaginal atrophy     Patient Active Problem List   Diagnosis Date Noted  . HLD (hyperlipidemia) 07/24/2019  . Degeneration of lumbar intervertebral disc 06/25/2019  . Lesion of ulnar nerve 06/25/2019  . Paresthesia 06/25/2019  . Primary localized osteoarthrosis, hand 12/17/2018  . Bloating 11/19/2018  . Pain in both wrists 11/19/2018  . Screening for cardiovascular condition 11/19/2018  . HTN (hypertension) 11/17/2017  . Anxiety 11/17/2017  . Myofascial pain dysfunction syndrome 01/10/2017  . Multinodular goiter 08/19/2016  . Thyroid nodule 07/14/2016  . Sinusitis 06/15/2016  . Chronic low back pain 11/21/2014  .  Osteopenia 11/21/2014  . Status post right unicompartmental knee replacement 11/14/2014  . Routine general medical examination at a health care facility 07/19/2013  . Atrophic vaginitis 07/16/2012  . Allergic rhinitis 07/16/2012  . Scoliosis 06/21/1986    Past Surgical History:  Procedure Laterality Date  . BREAST BIOPSY Right 2000 and 2005   fibroadenoma right breast x2  . NASAL SEPTUM SURGERY    . partial knee replacement Right 2013   Dr. Jefm Bryant  . semithyroidectomy of right side  08/2106  . TUBAL LIGATION    . VAGINAL DELIVERY     2    Prior to Admission medications   Medication Sig Start Date End Date Taking? Authorizing Provider  amLODipine (NORVASC) 2.5 MG tablet Take 1 tablet (2.5 mg total) by mouth daily. 11/19/18   Burnard Hawthorne, FNP  azithromycin (ZITHROMAX) 250 MG tablet Take 2 tablets ( total 500 mg) PO on day 1, then take 1 tablet ( total 250 mg) by mouth q24h x 4 days. 07/24/19   Burnard Hawthorne, FNP  Cholecalciferol (VITAMIN D) 2000 UNITS CAPS Take 1 capsule by mouth daily.    [provider]  cycloSPORINE (RESTASIS) 0.05 % ophthalmic emulsion Place 1 drop into both eyes 2 (two) times daily. 12/19/16   Burnard Hawthorne, FNP  dexamethasone (DECADRON) 0.5 MG/5ML solution See admin instructions. 06/13/19   [provider]  doxycycline (VIBRA-TABS) 100 MG tablet Take 1  tablet (100 mg total) by mouth 2 (two) times daily. 07/26/19   Burnard Hawthorne, FNP  estradiol (ESTRACE) 0.1 MG/GM vaginal cream Use intravaginally 1-3 times per week. 08/08/18   Burnard Hawthorne, FNP  fexofenadine (ALLEGRA) 30 MG tablet Take 30 mg 2 (two) times daily by mouth.    [provider]  flurandrenolide (CORDRAN) 0.05 % lotion Apply topically daily as needed. Do not use on face. 12/19/16   Burnard Hawthorne, FNP  fluticasone (FLONASE) 50 MCG/ACT nasal spray Place 2 sprays into both nostrils daily. 11/19/18   Burnard Hawthorne, FNP  FLUZONE HIGH-DOSE QUADRIVALENT  0.7 ML SUSY TO BE ADMINISTERED BY PHARMACIST FOR IMMUNIZATION 07/19/19   [provider]  Hyaluronic Acid-Vitamin C (HYALURONIC ACID PO) Take 2 capsules by mouth daily.    [provider]  hyoscyamine (LEVSIN SL) 0.125 MG SL tablet Place 1 tablet (0.125 mg total) under the tongue every 4 (four) hours as needed. 06/25/19   Lucilla Lame, MD  levothyroxine (SYNTHROID, LEVOTHROID) 25 MCG tablet Take 1 tablet (25 mcg total) by mouth every morning. 11/23/18   Burnard Hawthorne, FNP  Menaquinone-7 (VITAMIN K2 PO) Take 1 capsule by mouth daily.    [provider]  OVER THE COUNTER MEDICATION Take 3 capsules by mouth daily. Instaflex    [provider]  OVER THE COUNTER MEDICATION Take 1 capsule by mouth 2 (two) times daily. Tumeric    [provider]  predniSONE (DELTASONE) 10 MG tablet Take 40 mg by mouth on day 1, then taper 10 mg daily until gone 07/24/19   Burnard Hawthorne, FNP  silver sulfADIAZINE (SILVADENE) 1 % cream Apply 1 application topically daily. 11/19/18   Burnard Hawthorne, FNP  sulfamethoxazole-trimethoprim (BACTRIM DS) 800-160 MG tablet Take 1 tablet by mouth 2 (two) times daily. 08/22/19   Jodelle Green, FNP    Allergies Penicillin v potassium and Penicillins  Family History  Problem Relation Age of Onset  . Cancer Mother        breast  . Breast cancer Mother 2  . Cancer Father        lung    Social History Social History   Tobacco Use  . Smoking status: Never Smoker  . Smokeless tobacco: Never Used  Substance Use Topics  . Alcohol use: Yes    Comment: occasional  . Drug use: Yes    Review of Systems  Constitutional: No fever/chills Eyes: No visual changes. ENT: No sore throat. Cardiovascular: Positive for chest pain. Respiratory: Positive for shortness of breath. Gastrointestinal: No abdominal pain.  No nausea, no vomiting.  No diarrhea.  No constipation. Genitourinary: Negative for dysuria. Musculoskeletal:  Negative for back pain. Skin: Negative for rash. Neurological: Negative for headaches, focal weakness or numbness.  ____________________________________________   PHYSICAL EXAM:  VITAL SIGNS: ED Triage Vitals  Enc Vitals Group     BP 09/09/19 0850 134/73     Pulse Rate 09/09/19 0850 (!) 102     Resp 09/09/19 0850 18     Temp 09/09/19 0850 98 F (36.7 C)     Temp Source 09/09/19 0850 Oral     SpO2 09/09/19 0850 100 %     Weight 09/09/19 0847 159 lb (72.1 kg)     Height 09/09/19 0847 5\' 6"  (1.676 m)     Head Circumference --      Peak Flow --      Pain Score 09/09/19 0847 2     Pain  Loc --      Pain Edu? --      Excl. in [place]? --     Constitutional: Alert and oriented. Eyes: Conjunctivae are normal. Head: Atraumatic. Nose: No congestion/rhinnorhea. Mouth/Throat: Mucous membranes are moist. Neck: Normal ROM Cardiovascular: Tachycardic, regular rhythm. Grossly normal heart sounds. Respiratory: Normal respiratory effort.  No retractions. Lungs CTAB. Gastrointestinal: Soft and nontender. No distention. Genitourinary: deferred Musculoskeletal: No lower extremity tenderness nor edema. Neurologic:  Normal speech and language. No gross focal neurologic deficits are appreciated. Skin:  Skin is warm, dry and intact. No rash noted. Psychiatric: Mood and affect are normal. Speech and behavior are normal.  ____________________________________________   LABS (all labs ordered are listed, but only abnormal results are displayed)  Labs Reviewed  CBC WITH DIFFERENTIAL/PLATELET - Abnormal; Notable for the following components:      Result Value   Hemoglobin 15.1 (*)    All other components within normal limits  BASIC METABOLIC PANEL - Abnormal; Notable for the following components:   Glucose, Bld 101 (*)    All other components within normal limits  BRAIN NATRIURETIC PEPTIDE  TROPONIN I (HIGH SENSITIVITY)  TROPONIN I (HIGH SENSITIVITY)    ____________________________________________  EKG  ED ECG REPORT I, Blake Divine, the attending physician, personally viewed and interpreted this ECG.   Date: 09/09/2019  EKG Time: 8:54  Rate: 102  Rhythm: normal sinus rhythm  Axis: Normal  Intervals:left bundle branch block  ST&T Change: Negative Sgarbossa    PROCEDURES  Procedure(s) performed (including Critical Care):  Procedures   ____________________________________________   INITIAL IMPRESSION / ASSESSMENT AND PLAN / ED COURSE       68 year old female presents to the ED with intermittent chest tightness and mild shortness of breath over the past week.  She denies any significant symptoms at present.  EKG shows left bundle branch block with no recent EKG for comparison, however it is negative for scar Bosa criteria and given her very atypical symptoms, low suspicion for ACS.  Will screen 2 sets of troponin as well as chest x-ray and basic labs.  She is tachycardic, however suspect this is related to anxiety and patient states she always gets anxious when she goes to see a doctor.  Do not suspect PE as there are no signs or symptoms of DVT and symptoms would be atypical for PE.  2 sets troponin are unremarkable, patient remains asymptomatic.  Chest x-ray negative for acute process and remainder of labs unremarkable.  Patient provided with referral to cardiology for left bundle branch block and counseled to follow-up with PCP.  Patient agrees with plan.      ____________________________________________   FINAL CLINICAL IMPRESSION(S) / ED DIAGNOSES  Final diagnoses:  Chest tightness  LBBB (left bundle branch block)     ED Discharge Orders    None       Note:  This document was prepared using Dragon voice recognition software and may include unintentional dictation errors.   Blake Divine, MD 09/09/19 1700

## 2019-09-09 NOTE — ED Notes (Signed)
Pt gathered all personal belongings from room and removed them prior to ED departure  

## 2019-09-09 NOTE — ED Triage Notes (Signed)
Pt c/o chest tightness since last week, states she has hx of anxiety but was concerned that it was not improving. States she is having some SOB, pt is able to speak in complete sentences without difficulty.

## 2019-09-17 ENCOUNTER — Encounter: Payer: Self-pay | Admitting: Family Medicine

## 2019-09-17 ENCOUNTER — Ambulatory Visit (INDEPENDENT_AMBULATORY_CARE_PROVIDER_SITE_OTHER): Payer: Medicare Other | Admitting: Family Medicine

## 2019-09-17 ENCOUNTER — Ambulatory Visit: Payer: Self-pay

## 2019-09-17 ENCOUNTER — Other Ambulatory Visit: Payer: Self-pay

## 2019-09-17 VITALS — BP 140/86 | HR 106 | Temp 97.1°F | Wt 158.0 lb

## 2019-09-17 DIAGNOSIS — Z23 Encounter for immunization: Secondary | ICD-10-CM | POA: Diagnosis not present

## 2019-09-17 DIAGNOSIS — W540XXA Bitten by dog, initial encounter: Secondary | ICD-10-CM

## 2019-09-17 DIAGNOSIS — S51852A Open bite of left forearm, initial encounter: Secondary | ICD-10-CM

## 2019-09-17 NOTE — Telephone Encounter (Signed)
[name] LPN spoke with pt and pt was neg for covid screening. FYI to Dr Damita Dunnings.

## 2019-09-17 NOTE — Telephone Encounter (Signed)
Phone call from pt.  Stated she rec'd a dog bite about an hour ago. Reported she was walking in a park, and upon passing a dog on a leash, with it's owner.  Stated the dog "lunged" at her and bit her on left forearm.  Reported she received 1 puncture wound, and 2 abrasions.  Described the skin as looking purple-red in color.  Reported there was bleeding initially from the puncture wound; bleeding has subsided.  Reported she obtained information about the dog and it's Veterinarian.  Contacted the Vet, and was advised that the dog's Rabies shot is up to date.  Pt. Reported she washed the wound with soap and water, and cleansed it with H2O2.  Advised to apply an antibiotic oint., and cover with clean gauze or large band-aid.  Noted last Tetanus shot was 07/2010.  Appt. Scheduled at [facility] today, since PCP office did not have any availability today.  Verb. Understanding and agreed with plan.          Reason for Disposition . [1] Last tetanus shot > 5 years ago AND [2] any wound (e.g., cut, scrape)  Answer Assessment - Initial Assessment Questions 1. ANIMAL: "What type of animal caused the bite?" "Is the injury from a bite or a claw?" If the animal is a dog or a cat, ask: "Was it a pet or a stray?" "Was it acting ill or behaving strangely?"     Dog / checked that dog is up to date on rabies vaccine per Vet.  2. LOCATION: "Where is the bite located?"      Left forearm 3. SIZE: "How big is the bite?" "What does it look like?"      One puncture wound that initially bled/ bleeding has stopped , 2 areas have abrasion; purple red discoloration  4. ONSET: "When did the bite happen?" (Minutes or hours ago)      Approx. 9:30 AM  5. CIRCUMSTANCES: "Tell me how this happened."      Walking at a park by herself; a dog that was on a leash lunged at her and bit her in the arm  6. TETANUS: "When was the last tetanus booster?"     07/2010 7. PREGNANCY: "Is there any chance you are pregnant?" "When was your last  menstrual period?"     N/a  Protocols used: ANIMAL BITE-A-AH

## 2019-09-17 NOTE — Telephone Encounter (Signed)
See OV note.  Thanks.  

## 2019-09-17 NOTE — Progress Notes (Signed)
Dog bite this AM.  Was walking at the park locally.  [place] had a dog on a leash but the dog lunged and bit her L forearm.  She was wearing a jacket .  3 sites on the L forearm.  The students pulled the dog back.  She got the name and address.  She checked with the vet and the dog was vaccinated.  The dog was acting normally except for the event today.  No other known bites with this animal.   She cleaned the bite sites PTA.  No tears seen on the L sleeve of her jacket by patient or me, inspected at office visit.  Meds, vitals, and allergies reviewed.   ROS: Per HPI unless specifically indicated in ROS section   nad ncat rrr Left arm with 3 lesions noted on the forearm.  60mm 44mm and 23mm superficial scrapes on the L forearm.  Local bruising.  Distally nv intact.  None of the bites are near the joint.  No spreading erythema.  No bleeding.

## 2019-09-17 NOTE — Patient Instructions (Signed)
The spots look fine.   You will have bruising over the next few days.   Keep the areas clean and covered as needed.   No indication for oral antibiotics.  Tetanus shot today.  Update [place] as needed.  Take care.  Glad to see you.

## 2019-09-18 DIAGNOSIS — W540XXA Bitten by dog, initial encounter: Secondary | ICD-10-CM | POA: Insufficient documentation

## 2019-09-18 NOTE — Assessment & Plan Note (Signed)
She had cleaned the sites prior to arrival.  Fortunately she was wearing a jacket and it does not appear that the bite was able to puncture through the jacket.  The wounds are clean appearing and only superficial scrapes.  We re-bandaged her arm.  No indication for antibiotic prophylaxis.  The dog was vaccinated.  Tetanus shot updated today.  Routine cautions given to patient.  She will likely have bruising develop in the next day or two.  Update [place] as needed.  She agrees.

## 2019-10-01 DIAGNOSIS — R002 Palpitations: Secondary | ICD-10-CM | POA: Insufficient documentation

## 2019-10-09 ENCOUNTER — Other Ambulatory Visit: Payer: Self-pay

## 2019-10-09 ENCOUNTER — Ambulatory Visit
Admission: RE | Admit: 2019-10-09 | Discharge: 2019-10-09 | Disposition: A | Discharge status: Home / Self Care | Payer: Medicare Other | Source: Ambulatory Visit | Attending: Family | Admitting: Family

## 2019-10-09 DIAGNOSIS — Z13820 Encounter for screening for osteoporosis: Secondary | ICD-10-CM | POA: Diagnosis present

## 2019-10-10 ENCOUNTER — Encounter: Payer: Self-pay | Admitting: Family

## 2019-10-23 ENCOUNTER — Ambulatory Visit (INDEPENDENT_AMBULATORY_CARE_PROVIDER_SITE_OTHER): Payer: Medicare Other | Admitting: Family Medicine

## 2019-10-23 ENCOUNTER — Other Ambulatory Visit: Payer: Self-pay

## 2019-10-23 ENCOUNTER — Encounter: Payer: Self-pay | Admitting: Family Medicine

## 2019-10-23 VITALS — Ht 66.0 in | Wt 158.0 lb

## 2019-10-23 DIAGNOSIS — R030 Elevated blood-pressure reading, without diagnosis of hypertension: Secondary | ICD-10-CM | POA: Diagnosis not present

## 2019-10-23 DIAGNOSIS — H6983 Other specified disorders of Eustachian tube, bilateral: Secondary | ICD-10-CM

## 2019-10-23 DIAGNOSIS — J302 Other seasonal allergic rhinitis: Secondary | ICD-10-CM

## 2019-10-23 DIAGNOSIS — R0981 Nasal congestion: Secondary | ICD-10-CM | POA: Diagnosis not present

## 2019-10-23 DIAGNOSIS — M810 Age-related osteoporosis without current pathological fracture: Secondary | ICD-10-CM

## 2019-10-23 MED ORDER — AMLODIPINE BESYLATE 2.5 MG PO TABS: 2.5000 mg | ORAL_TABLET | Freq: Every day | ORAL | 3 refills | Status: DC

## 2019-10-23 NOTE — Progress Notes (Signed)
Patient ID: Jill Michael, female   DOB: 1951/05/18, 68 y.o.   MRN: [id number]    Virtual Visit via video Note  This visit type was conducted due to national recommendations for restrictions regarding the COVID-19 pandemic (e.g. social distancing).  This format is felt to be most appropriate for this patient at this time.  All issues noted in this document were discussed and addressed.  No physical exam was performed (except for noted visual exam findings with Video Visits).   I connected with Daiva Eves today at  8:40 AM EST by a video enabled telemedicine application or telephone and verified that I am speaking with the correct person using two identifiers. Location patient: home Location provider: work or home office Persons participating in the virtual visit: patient, provider  I discussed the limitations, risks, security and privacy concerns of performing an evaluation and management service by telephone and the availability of in person appointments. I also discussed with the patient that there may be a patient responsible charge related to this service. The patient expressed understanding and agreed to proceed.  HPI:  Patient and I connected via video to discuss complaints of scratchy throat congestion ear fullness that have waxed and waned over the course of 2 weeks.  Patient has a longstanding history of seasonal allergies and does seem to have them flareup in both the fall and spring.  Takes Allegra and Flonase daily.  Has added back in Mucinex as well, has noticed some good effect of the Mucinex.  Does go for walks at least 1 or 2 times a day, so was outdoors and exposed to the elements often.  Denies fever or chills.  Denies body aches.  Denies vomiting or diarrhea.  Denies cough/shortness of breath or wheezing.  Denies chest pain.  Patient also would like to report she saw specialist in regards to osteoporosis and will be starting Reclast.  Also requesting refill of amlodipine  for her blood pressure.  States she feels well on this medicine, no issues with leg swelling   ROS: See pertinent positives and negatives per HPI.  Past Medical History:  Diagnosis Date  . Allergy    hay fever  . Arthritis   . Arthritis    right knee, followed by Jefm Bryant  . Chicken pox   . Low back pain    recurrent  . Osteopenia   . Scoliosis   . Scoliosis   . UTI (urinary tract infection)   . Vaginal atrophy     Past Surgical History:  Procedure Laterality Date  . BREAST BIOPSY Right 2000 and 2005   fibroadenoma right breast x2  . NASAL SEPTUM SURGERY    . partial knee replacement Right 2013   Dr. Jefm Bryant  . semithyroidectomy of right side  08/2106  . TUBAL LIGATION    . VAGINAL DELIVERY     2    Family History  Problem Relation Age of Onset  . Cancer Mother        breast  . Breast cancer Mother 49  . Cancer Father        lung   Social History   Tobacco Use  . Smoking status: Never Smoker  . Smokeless tobacco: Never Used  Substance Use Topics  . Alcohol use: Yes    Comment: occasional    Current Outpatient Medications:  .  amLODipine (NORVASC) 2.5 MG tablet, Take 1 tablet (2.5 mg total) by mouth daily., Disp: 90 tablet, Rfl: 3 .  Cholecalciferol (VITAMIN  D) 2000 UNITS CAPS, Take 1 capsule by mouth daily., Disp: , Rfl:  .  cycloSPORINE (RESTASIS) 0.05 % ophthalmic emulsion, Place 1 drop into both eyes 2 (two) times daily., Disp: 180 each, Rfl: 3 .  dexamethasone (DECADRON) 0.5 MG/5ML solution, See admin instructions., Disp: , Rfl:  .  estradiol (ESTRACE) 0.1 MG/GM vaginal cream, Use intravaginally 1-3 times per week., Disp: 42.5 g, Rfl: 11 .  fexofenadine (ALLEGRA) 30 MG tablet, Take 30 mg 2 (two) times daily by mouth., Disp: , Rfl:  .  flurandrenolide (CORDRAN) 0.05 % lotion, Apply topically daily as needed. Do not use on face., Disp: 60 mL, Rfl: 1 .  fluticasone (FLONASE) 50 MCG/ACT nasal spray, Place 2 sprays into both nostrils daily., Disp: 50 g,  Rfl: 5 .  FLUZONE HIGH-DOSE QUADRIVALENT 0.7 ML SUSY, TO BE ADMINISTERED BY PHARMACIST FOR IMMUNIZATION, Disp: , Rfl:  .  levothyroxine (SYNTHROID, LEVOTHROID) 25 MCG tablet, Take 1 tablet (25 mcg total) by mouth every morning., Disp: 90 tablet, Rfl: 3 .  Omega-3 Fatty Acids (FISH OIL) 1000 MG CAPS, Take 1 capsule by mouth., Disp: , Rfl:  .  OVER THE COUNTER MEDICATION, Take 3 capsules by mouth daily. Instaflex, Disp: , Rfl:  .  OVER THE COUNTER MEDICATION, Take 1 capsule by mouth 2 (two) times daily. Tumeric, Disp: , Rfl:  .  silver sulfADIAZINE (SILVADENE) 1 % cream, Apply 1 application topically daily., Disp: 50 g, Rfl: 1 .  Hyaluronic Acid-Vitamin C (HYALURONIC ACID PO), Take 2 capsules by mouth daily., Disp: , Rfl:   EXAM:  GENERAL: alert, oriented, appears well and in no acute distress  HEENT: atraumatic, conjunttiva clear, no obvious abnormalities on inspection of external nose and ears  NECK: normal movements of the head and neck  LUNGS: on inspection no signs of respiratory distress, breathing rate appears normal, no obvious gross SOB, gasping or wheezing  CV: no obvious cyanosis  MS: moves all visible extremities without noticeable abnormality  PSYCH/NEURO: pleasant and cooperative, no obvious depression or anxiety, speech and thought processing grossly intact  ASSESSMENT AND PLAN:  Discussed the following assessment and plan:  Eustachian tube dysfunction, bilateral  Sinus congestion  Seasonal allergic rhinitis, unspecified trigger  Elevated blood pressure reading - Plan: amLODipine (NORVASC) 2.5 MG tablet  Osteoporosis - will be starting reclast  Patient will continue allergy medicine, Flonase and also add in saline nasal rinses daily.  She will continue adding in Mucinex twice a day for at least the next 1 to 2 weeks to see if this helps calm her symptoms.  Amlodipine refilled.  Updated information in chart indicating she will be in Reclast for her osteoporosis.   Advised patient that seasonal allergies require [place] to be consistent with her allergy treatment to keep his symptoms at [place].  Advised that I do not feel she needs an antibiotic at this time.  Patient will let us know if symptoms worsen.   I discussed the assessment and treatment plan with the patient. The patient was provided an opportunity to ask questions and all were answered. The patient agreed with the plan and demonstrated an understanding of the instructions.   The patient was advised to call back or seek an in-person evaluation if the symptoms worsen or if the condition fails to improve as anticipated.   Jodelle Green, FNP

## 2019-10-24 DIAGNOSIS — M810 Age-related osteoporosis without current pathological fracture: Secondary | ICD-10-CM | POA: Insufficient documentation

## 2019-11-04 ENCOUNTER — Other Ambulatory Visit: Payer: Self-pay | Admitting: Family

## 2019-11-04 DIAGNOSIS — Z1231 Encounter for screening mammogram for malignant neoplasm of breast: Secondary | ICD-10-CM

## 2019-11-22 DIAGNOSIS — M7712 Lateral epicondylitis, left elbow: Secondary | ICD-10-CM | POA: Insufficient documentation

## 2019-11-25 ENCOUNTER — Other Ambulatory Visit: Payer: Self-pay

## 2019-11-25 ENCOUNTER — Ambulatory Visit (INDEPENDENT_AMBULATORY_CARE_PROVIDER_SITE_OTHER): Payer: Medicare Other | Admitting: Family

## 2019-11-25 ENCOUNTER — Encounter: Payer: Self-pay | Admitting: Family

## 2019-11-25 VITALS — Ht 66.0 in | Wt 158.0 lb

## 2019-11-25 DIAGNOSIS — E041 Nontoxic single thyroid nodule: Secondary | ICD-10-CM

## 2019-11-25 DIAGNOSIS — Z1239 Encounter for other screening for malignant neoplasm of breast: Secondary | ICD-10-CM

## 2019-11-25 DIAGNOSIS — E785 Hyperlipidemia, unspecified: Secondary | ICD-10-CM

## 2019-11-25 DIAGNOSIS — I10 Essential (primary) hypertension: Secondary | ICD-10-CM | POA: Diagnosis not present

## 2019-11-25 DIAGNOSIS — Z0000 Encounter for general adult medical examination without abnormal findings: Secondary | ICD-10-CM

## 2019-11-25 DIAGNOSIS — I447 Left bundle-branch block, unspecified: Secondary | ICD-10-CM

## 2019-11-25 DIAGNOSIS — N952 Postmenopausal atrophic vaginitis: Secondary | ICD-10-CM

## 2019-11-25 MED ORDER — ESTRADIOL 0.1 MG/GM VA CREA: TOPICAL_CREAM | VAGINAL | 11 refills | Status: DC

## 2019-11-25 MED ORDER — LEVOTHYROXINE SODIUM 25 MCG PO TABS: 25.0000 ug | ORAL_TABLET | Freq: Every morning | ORAL | 0 refills | Status: DC

## 2019-11-25 MED ORDER — FLUTICASONE PROPIONATE 50 MCG/ACT NA SUSP: 2.0000 | Freq: Every day | NASAL | 5 refills | Status: DC

## 2019-11-25 NOTE — Assessment & Plan Note (Signed)
Pending labs

## 2019-11-25 NOTE — Assessment & Plan Note (Signed)
Controlled; continue regimen.

## 2019-11-25 NOTE — Progress Notes (Signed)
Virtual Visit via Video Note  I connected with@  on 11/25/19 at  2:30 PM EST by a video enabled telemedicine application and verified that I am speaking with the correct person using two identifiers.  Location patient: home Location provider:work or home office Persons participating in the virtual visit: patient, provider  I discussed the limitations of evaluation and management by telemedicine and the availability of in person appointments. The patient expressed understanding and agreed to proceed.   HPI: Here for follow up.  HTN-  At home 129 /74.compliant with medication.  No cp, sob  Consult with Dr Ubaldo Glassing- new LBBB; had holter monitor, stress test ( normal per patient) , echo ( EF 50%, small pericardial effusion) ; f/u 01/2020 Osteoporosis-following with Dr. 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   Diarrhea resolved.   Follow-up with dermatology for periodic surveillance of lichen sclerosis  ROS: See pertinent positives and negatives per HPI.  Past Medical History:  Diagnosis Date  . Allergy    hay fever  . Arthritis   . Arthritis    right knee, followed by Jefm Bryant  . Chicken pox   . Low back pain    recurrent  . Osteopenia   . Scoliosis   . Scoliosis   . UTI (urinary tract infection)   . Vaginal atrophy     Past Surgical History:  Procedure Laterality Date  . BREAST BIOPSY Right 2000 and 2005   fibroadenoma right breast x2  . NASAL SEPTUM SURGERY    . partial knee replacement Right 2013   Dr. Jefm Bryant  . semithyroidectomy of right side  08/2106  . TUBAL LIGATION    . VAGINAL DELIVERY     2    Family History  Problem Relation Age of Onset  . Cancer Mother        breast  . Breast cancer Mother 15  . Cancer Father        lung  . CAD Brother     SOCIAL HX: never smoker   Current Outpatient Medications:  .  amLODipine (NORVASC) 2.5 MG tablet,  Take 1 tablet (2.5 mg total) by mouth daily., Disp: 90 tablet, Rfl: 3 .  Cholecalciferol (VITAMIN D) 2000 UNITS CAPS, Take 1 capsule by mouth daily., Disp: , Rfl:  .  cycloSPORINE (RESTASIS) 0.05 % ophthalmic emulsion, Place 1 drop into both eyes 2 (two) times daily., Disp: 180 each, Rfl: 3 .  dexamethasone (DECADRON) 0.5 MG/5ML solution, See admin instructions., Disp: , Rfl:  .  dexamethasone 0.5 MG/5ML elixir, Use as directed, Disp: , Rfl:  .  fexofenadine (ALLEGRA) 30 MG tablet, Take 30 mg 2 (two) times daily by mouth., Disp: , Rfl:  .  flurandrenolide (CORDRAN) 0.05 % lotion, Apply topically daily as needed. Do not use on face., Disp: 60 mL, Rfl: 1 .  FLUZONE HIGH-DOSE QUADRIVALENT 0.7 ML SUSY, TO BE ADMINISTERED BY PHARMACIST FOR IMMUNIZATION, Disp: , Rfl:  .  Omega-3 Fatty Acids (FISH OIL) 1000 MG CAPS, Take 1 capsule by mouth., Disp: , Rfl:  .  OVER THE COUNTER MEDICATION, Take 3 capsules by mouth daily. Instaflex, Disp: , Rfl:  .  OVER THE COUNTER MEDICATION, Take 1 capsule by mouth 2 (two) times daily. Tumeric, Disp: , Rfl:  .  silver sulfADIAZINE (SILVADENE) 1 % cream, Apply 1 application topically daily., Disp: 50 g, Rfl: 1 .  clobetasol ointment (TEMOVATE) 0.05 %, Apply to  AAs vaginal area starting BID until symptoms improve. Then use 2-3x/wk. Avoid face., Disp: , Rfl:  .  estradiol (ESTRACE) 0.1 MG/GM vaginal cream, Use intravaginally 1-3 times per week., Disp: 42.5 g, Rfl: 11 .  fluticasone (FLONASE) 50 MCG/ACT nasal spray, Place 2 sprays into both nostrils daily., Disp: 50 g, Rfl: 5 .  Hyaluronic Acid-Vitamin C (HYALURONIC ACID PO), Take 2 capsules by mouth daily., Disp: , Rfl:  .  levothyroxine (SYNTHROID) 25 MCG tablet, Take 1 tablet (25 mcg total) by mouth every morning., Disp: 90 tablet, Rfl: 0  EXAM:  VITALS per patient if applicable: There were no vitals filed for this visit. BP Readings from Last 3 Encounters:  09/17/19 140/86  09/09/19 108/64  09/02/19 132/88      GENERAL: alert, oriented, appears well and in no acute distress  HEENT: atraumatic, conjunttiva clear, no obvious abnormalities on inspection of external nose and ears  NECK: normal movements of the head and neck  LUNGS: on inspection no signs of respiratory distress, breathing rate appears normal, no obvious gross SOB, gasping or wheezing  CV: no obvious cyanosis  MS: moves all visible extremities without noticeable abnormality  PSYCH/NEURO: pleasant and cooperative, no obvious depression or anxiety, speech and thought processing grossly intact  ASSESSMENT AND PLAN:  Discussed the following assessment and plan:  Hypertension, unspecified type  Hyperlipidemia, unspecified hyperlipidemia type - Plan: Lipid panel-future  Thyroid nodule - Plan: TSH-future, CBC with Differential/Platelet  Encounter for screening for malignant neoplasm of breast, unspecified screening modality  LBBB (left bundle branch block)  Atrophic vaginitis Problem List Items Addressed This Visit      Cardiovascular and Mediastinum   HTN (hypertension) - Primary    Controlled; continue regimen.       LBBB (left bundle branch block)     Endocrine   Thyroid nodule    Pending labs.       Relevant Orders   TSH-future   CBC with Differential/Platelet     Genitourinary   Atrophic vaginitis     Other   HLD (hyperlipidemia)    Pending labs      Relevant Orders   Lipid panel-future    Other Visit Diagnoses    Encounter for screening for malignant neoplasm of breast, unspecified screening modality          -we discussed possible serious and likely etiologies, options for evaluation and workup, limitations of telemedicine visit vs in person visit, treatment, treatment risks and precautions. Pt prefers to treat via telemedicine empirically rather then risking or undertaking an in person visit at this moment. Patient agrees to seek prompt in person care if worsening, new symptoms arise, or if  is not improving with treatment.   I discussed the assessment and treatment plan with the patient. The patient was provided an opportunity to ask questions and all were answered. The patient agreed with the plan and demonstrated an understanding of the instructions.   The patient was advised to call back or seek an in-person evaluation if the symptoms worsen or if the condition fails to improve as anticipated.   Mable Paris, FNP

## 2019-12-12 ENCOUNTER — Other Ambulatory Visit: Payer: Self-pay | Admitting: Family

## 2019-12-12 DIAGNOSIS — E041 Nontoxic single thyroid nodule: Secondary | ICD-10-CM

## 2019-12-17 ENCOUNTER — Other Ambulatory Visit: Payer: Self-pay

## 2019-12-17 ENCOUNTER — Telehealth: Payer: Self-pay | Admitting: Family

## 2019-12-17 DIAGNOSIS — R300 Dysuria: Secondary | ICD-10-CM

## 2019-12-17 NOTE — Telephone Encounter (Signed)
Would be okay doing UA then have patient follow-up when appointment is available or can work her in or should I advise UC?

## 2019-12-17 NOTE — Telephone Encounter (Signed)
Any spots with other providers this week ? We can order urine and I would prefer looking at Doreene Nest , Verneda Skill 'a schedule to see if that have an acute spot

## 2019-12-17 NOTE — Telephone Encounter (Signed)
Patient is scheduled to leave urine tomorrow & is schedule at 12p on Friday 1/15 for doxy with Joycelyn Schmid to f/u. Patient also stated that Bactrim has worked really well for patient in the past.

## 2019-12-17 NOTE — Telephone Encounter (Signed)
Pt believes that she has a UTI and wants to have a urinalysis performed. Pressure in bladder/discomfort when urinating. Please advise. [phone]

## 2019-12-18 ENCOUNTER — Other Ambulatory Visit (INDEPENDENT_AMBULATORY_CARE_PROVIDER_SITE_OTHER): Payer: Medicare Other

## 2019-12-18 ENCOUNTER — Encounter: Payer: Self-pay | Admitting: Family

## 2019-12-18 ENCOUNTER — Other Ambulatory Visit: Payer: Self-pay

## 2019-12-18 DIAGNOSIS — R300 Dysuria: Secondary | ICD-10-CM

## 2019-12-18 LAB — POCT URINALYSIS DIPSTICK
Bilirubin, UA: NEGATIVE
Blood, UA: NEGATIVE
Glucose, UA: NEGATIVE
Ketones, UA: NEGATIVE
Nitrite, UA: NEGATIVE
Protein, UA: NEGATIVE
Spec Grav, UA: <=1.005 — AB (ref 1.010–1.025)
Urobilinogen, UA: 0.2 E.U./dL
pH, UA: 5 (ref 5.0–8.0)

## 2019-12-18 LAB — URINALYSIS, MICROSCOPIC ONLY: RBC / HPF: NONE SEEN (ref 0–?)

## 2019-12-20 ENCOUNTER — Ambulatory Visit: Payer: Medicare Other | Admitting: Family

## 2019-12-20 LAB — URINE CULTURE
MICRO NUMBER:: 10037627
Result:: NO GROWTH
SPECIMEN QUALITY:: ADEQUATE

## 2019-12-31 ENCOUNTER — Ambulatory Visit
Admission: RE | Admit: 2019-12-31 | Discharge: 2019-12-31 | Disposition: A | Discharge status: Home / Self Care | Payer: Medicare Other | Source: Ambulatory Visit | Attending: Family | Admitting: Family

## 2019-12-31 DIAGNOSIS — Z1231 Encounter for screening mammogram for malignant neoplasm of breast: Secondary | ICD-10-CM | POA: Diagnosis present

## 2020-01-09 ENCOUNTER — Other Ambulatory Visit (INDEPENDENT_AMBULATORY_CARE_PROVIDER_SITE_OTHER): Payer: Medicare Other

## 2020-01-09 ENCOUNTER — Other Ambulatory Visit: Payer: Self-pay

## 2020-01-09 DIAGNOSIS — E785 Hyperlipidemia, unspecified: Secondary | ICD-10-CM | POA: Diagnosis not present

## 2020-01-09 DIAGNOSIS — E041 Nontoxic single thyroid nodule: Secondary | ICD-10-CM

## 2020-01-09 LAB — LIPID PANEL
Cholesterol: 156 mg/dL (ref 0–200)
HDL: 49.3 mg/dL (ref >39.00)
LDL Cholesterol: 94 mg/dL (ref 0–99)
NonHDL: 107.01
Total CHOL/HDL Ratio: 3
Triglycerides: 65 mg/dL (ref 0.0–149.0)
VLDL: 13 mg/dL (ref 0.0–40.0)

## 2020-01-09 LAB — CBC WITH DIFFERENTIAL/PLATELET
Basophils Absolute: 0.1 10*3/uL (ref 0.0–0.1)
Basophils Relative: 1.5 % (ref 0.0–3.0)
Eosinophils Absolute: 0.1 10*3/uL (ref 0.0–0.7)
Eosinophils Relative: 2.1 % (ref 0.0–5.0)
HCT: 43.2 % (ref 36.0–46.0)
Hemoglobin: 14.7 g/dL (ref 12.0–15.0)
Lymphocytes Relative: 29.3 % (ref 12.0–46.0)
Lymphs Abs: 1.7 10*3/uL (ref 0.7–4.0)
MCHC: 34 g/dL (ref 30.0–36.0)
MCV: 94.2 fl (ref 78.0–100.0)
Monocytes Absolute: 0.7 10*3/uL (ref 0.1–1.0)
Monocytes Relative: 11.3 % (ref 3.0–12.0)
Neutro Abs: 3.2 10*3/uL (ref 1.4–7.7)
Neutrophils Relative %: 55.8 % (ref 43.0–77.0)
Platelets: 237 10*3/uL (ref 150.0–400.0)
RBC: 4.59 Mil/uL (ref 3.87–5.11)
RDW: 12.7 % (ref 11.5–15.5)
WBC: 5.8 10*3/uL (ref 4.0–10.5)

## 2020-01-09 LAB — TSH: TSH: 4.38 u[IU]/mL (ref 0.35–4.50)

## 2020-01-10 ENCOUNTER — Encounter: Payer: Self-pay | Admitting: Family

## 2020-01-15 ENCOUNTER — Ambulatory Visit (INDEPENDENT_AMBULATORY_CARE_PROVIDER_SITE_OTHER): Payer: Medicare Other | Admitting: Family

## 2020-01-15 ENCOUNTER — Other Ambulatory Visit: Payer: Self-pay | Admitting: Family

## 2020-01-15 ENCOUNTER — Other Ambulatory Visit: Payer: Self-pay

## 2020-01-15 ENCOUNTER — Encounter: Payer: Self-pay | Admitting: Family

## 2020-01-15 VITALS — Ht 65.5 in | Wt 153.1 lb

## 2020-01-15 DIAGNOSIS — E785 Hyperlipidemia, unspecified: Secondary | ICD-10-CM

## 2020-01-15 DIAGNOSIS — J019 Acute sinusitis, unspecified: Secondary | ICD-10-CM | POA: Diagnosis not present

## 2020-01-15 MED ORDER — ROSUVASTATIN CALCIUM 5 MG PO TABS: 5.0000 mg | ORAL_TABLET | Freq: Every day | ORAL | 3 refills | Status: DC

## 2020-01-15 MED ORDER — ROSUVASTATIN CALCIUM 10 MG PO TABS: 10.0000 mg | ORAL_TABLET | Freq: Every day | ORAL | 3 refills | Status: DC

## 2020-01-15 MED ORDER — PREDNISONE 10 MG PO TABS: ORAL_TABLET | ORAL | 0 refills | Status: DC

## 2020-01-15 MED ORDER — CEFDINIR 300 MG PO CAPS: 300.0000 mg | ORAL_CAPSULE | Freq: Two times a day (BID) | ORAL | 0 refills | Status: DC

## 2020-01-15 NOTE — Progress Notes (Signed)
Virtual Visit via Video Note  I connected with@  on 01/15/20 at  2:30 PM EST by a video enabled telemedicine application and verified that I am speaking with the correct person using two identifiers.  Location patient: home Location provider:work  Persons participating in the virtual visit: patient, provider  I discussed the limitations of evaluation and management by telemedicine and the availability of in person appointments. The patient expressed understanding and agreed to proceed.   HPI:  CC: right sided sinus pressure, right sided ear pain x several weeks, worsened over the past day. Endorses 'dull headache' ,bad taste in mouth. Occasional dry cough.  NO fever, sob,  wheezing, cp  Thinks walking outside , particularly windy, will contribute. Has had sneezing, PND for months Has been using mucinex, allegra, simply saline with some relief. In the past ceclor ,prednisone worked well No cephalosporin allergy, would like ceclor. H/o PCN allergy - had a rash at 69 years old.   ROS: See pertinent positives and negatives per HPI.  Past Medical History:  Diagnosis Date  . Allergy    hay fever  . Arthritis   . Arthritis    right knee, followed by Jefm Bryant  . Chicken pox   . Low back pain    recurrent  . Osteopenia   . Scoliosis   . Scoliosis   . UTI (urinary tract infection)   . Vaginal atrophy     Past Surgical History:  Procedure Laterality Date  . BREAST BIOPSY Right 2000 and 2005   fibroadenoma right breast x2- both Bx's done under stereo  . NASAL SEPTUM SURGERY    . partial knee replacement Right 2013   Dr. Jefm Bryant  . semithyroidectomy of right side  08/2106  . TUBAL LIGATION    . VAGINAL DELIVERY     2    Family History  Problem Relation Age of Onset  . Cancer Mother        breast  . Breast cancer Mother 37  . Cancer Father        lung  . CAD Brother     SOCIAL HX: never smoker   Current Outpatient Medications:  .  amLODipine (NORVASC) 2.5 MG  tablet, Take 1 tablet (2.5 mg total) by mouth daily., Disp: 90 tablet, Rfl: 3 .  Cholecalciferol (VITAMIN D) 2000 UNITS CAPS, Take 1 capsule by mouth daily., Disp: , Rfl:  .  clobetasol ointment (TEMOVATE) 0.05 %, Apply to AAs vaginal area starting BID until symptoms improve. Then use 2-3x/wk. Avoid face., Disp: , Rfl:  .  cycloSPORINE (RESTASIS) 0.05 % ophthalmic emulsion, Place 1 drop into both eyes 2 (two) times daily., Disp: 180 each, Rfl: 3 .  dexamethasone (DECADRON) 0.5 MG/5ML solution, See admin instructions., Disp: , Rfl:  .  estradiol (ESTRACE) 0.1 MG/GM vaginal cream, Use intravaginally 1-3 times per week., Disp: 42.5 g, Rfl: 11 .  fexofenadine (ALLEGRA) 30 MG tablet, Take 30 mg 2 (two) times daily by mouth., Disp: , Rfl:  .  fluticasone (FLONASE) 50 MCG/ACT nasal spray, Place 2 sprays into both nostrils daily., Disp: 50 g, Rfl: 5 .  FLUZONE HIGH-DOSE QUADRIVALENT 0.7 ML SUSY, TO BE ADMINISTERED BY PHARMACIST FOR IMMUNIZATION, Disp: , Rfl:  .  Hyaluronic Acid-Vitamin C (HYALURONIC ACID PO), Take 2 capsules by mouth daily., Disp: , Rfl:  .  levothyroxine (SYNTHROID) 25 MCG tablet, TAKE 1 TABLET BY MOUTH EVERY MORNING, Disp: 90 tablet, Rfl: 3 .  Omega-3 Fatty Acids (FISH OIL) 1000 MG CAPS, Take  1 capsule by mouth., Disp: , Rfl:  .  OVER THE COUNTER MEDICATION, Take 3 capsules by mouth daily. Instaflex, Disp: , Rfl:  .  OVER THE COUNTER MEDICATION, Take 1 capsule by mouth 2 (two) times daily. Tumeric, Disp: , Rfl:  .  silver sulfADIAZINE (SILVADENE) 1 % cream, Apply 1 application topically daily., Disp: 50 g, Rfl: 1 .  cefdinir (OMNICEF) 300 MG capsule, Take 1 capsule (300 mg total) by mouth 2 (two) times daily., Disp: 20 capsule, Rfl: 0 .  flurandrenolide (CORDRAN) 0.05 % lotion, Apply topically daily as needed. Do not use on face., Disp: 60 mL, Rfl: 1 .  predniSONE (DELTASONE) 10 MG tablet, Take 40 mg by mouth on day 1, then taper 10 mg daily until gone, Disp: 10 tablet, Rfl: 0 .   rosuvastatin (CRESTOR) 5 MG tablet, Take 1 tablet (5 mg total) by mouth at bedtime., Disp: 90 tablet, Rfl: 3  EXAM:  VITALS per patient if applicable:  GENERAL: alert, oriented, appears well and in no acute distress  HEENT: atraumatic, conjunttiva clear, no obvious abnormalities on inspection of external nose and ears.  No erythema or facial swelling appreciated  NECK: normal movements of the head and neck  LUNGS: on inspection no signs of respiratory distress, breathing rate appears normal, no obvious gross SOB, gasping or wheezing  CV: no obvious cyanosis  MS: moves all visible extremities without noticeable abnormality  PSYCH/NEURO: pleasant and cooperative, no obvious depression or anxiety, speech and thought processing grossly intact  ASSESSMENT AND PLAN:  Discussed the following assessment and plan:  Acute sinusitis, recurrence not specified, unspecified location - Plan: cefdinir (OMNICEF) 300 MG capsule, predniSONE (DELTASONE) 10 MG tablet Problem List Items Addressed This Visit      Respiratory   Sinusitis - Primary    Start cefdinir. Advised of potential for cross reaction as she has PCN allergy. She is adamant that she has had multiple cephalosporins in the past. Advised probiotic with omnicef, prednisone. She will let me know how she is doing.       Relevant Medications   cefdinir (OMNICEF) 300 MG capsule   predniSONE (DELTASONE) 10 MG tablet      -we discussed possible serious and likely etiologies, options for evaluation and workup, limitations of telemedicine visit vs in person visit, treatment, treatment risks and precautions. Pt prefers to treat via telemedicine empirically rather then risking or undertaking an in person visit at this moment. Patient agrees to seek prompt in person care if worsening, new symptoms arise, or if is not improving with treatment.   I discussed the assessment and treatment plan with the patient. The patient was provided an  opportunity to ask questions and all were answered. The patient agreed with the plan and demonstrated an understanding of the instructions.   The patient was advised to call back or seek an in-person evaluation if the symptoms worsen or if the condition fails to improve as anticipated.   Jill Paris, FNP

## 2020-01-15 NOTE — Assessment & Plan Note (Addendum)
Start cefdinir. Advised of potential for cross reaction as she has PCN allergy. She is adamant that she has had multiple cephalosporins in the past. Advised probiotic with omnicef, prednisone. She will let me know how she is doing.

## 2020-01-20 ENCOUNTER — Telehealth: Payer: Self-pay | Admitting: Family

## 2020-01-20 NOTE — Telephone Encounter (Signed)
What should I advise patient since this is the risk of antibiotics?

## 2020-01-20 NOTE — Telephone Encounter (Signed)
I called & informed patient of below. She stated that she would try just taking the steroid to see if there is any improvement. She said that her ear had started to hurt again since stopping the antibiotic today. She would give prednisone some time though & let us know if any improvement or if she felt that she needed to try another antibiotic. She also stated that she will start taking a probiotic bc she thought that she had been advised to do that previously when taking any anabiotics.

## 2020-01-20 NOTE — Telephone Encounter (Signed)
Call pt Loose stool is a side effect of most antibiotics however it is fine if she stopped I would advise complete the prednisone  If she has had mild improvement,  I would advise that we give it time to respond to prednisone.

## 2020-01-20 NOTE — Telephone Encounter (Signed)
Pt called to let Joycelyn Schmid know that the cefdinir (OMNICEF) 300 MG capsule is giving her loose stool and she stopped taking it and wanted to know if she could be put on something else

## 2020-02-24 ENCOUNTER — Encounter: Payer: Self-pay | Admitting: Family

## 2020-03-09 ENCOUNTER — Other Ambulatory Visit (INDEPENDENT_AMBULATORY_CARE_PROVIDER_SITE_OTHER): Payer: Medicare Other

## 2020-03-09 ENCOUNTER — Other Ambulatory Visit: Payer: Self-pay

## 2020-03-09 DIAGNOSIS — E785 Hyperlipidemia, unspecified: Secondary | ICD-10-CM

## 2020-03-09 LAB — COMPREHENSIVE METABOLIC PANEL
ALT: 23 U/L (ref 0–35)
AST: 23 U/L (ref 0–37)
Albumin: 4.2 g/dL (ref 3.5–5.2)
Alkaline Phosphatase: 34 U/L — ABNORMAL LOW (ref 39–117)
BUN: 16 mg/dL (ref 6–23)
CO2: 31 mEq/L (ref 19–32)
Calcium: 9.3 mg/dL (ref 8.4–10.5)
Chloride: 104 mEq/L (ref 96–112)
Creatinine, Ser: 0.74 mg/dL (ref 0.40–1.20)
GFR: 78.09 mL/min (ref >60.00)
Glucose, Bld: 68 mg/dL — ABNORMAL LOW (ref 70–99)
Potassium: 4 mEq/L (ref 3.5–5.1)
Sodium: 140 mEq/L (ref 135–145)
Total Bilirubin: 0.6 mg/dL (ref 0.2–1.2)
Total Protein: 6.8 g/dL (ref 6.0–8.3)

## 2020-03-10 ENCOUNTER — Encounter: Payer: Self-pay | Admitting: Family

## 2020-03-11 ENCOUNTER — Encounter: Payer: Self-pay | Admitting: Family

## 2020-03-11 ENCOUNTER — Ambulatory Visit (INDEPENDENT_AMBULATORY_CARE_PROVIDER_SITE_OTHER): Payer: Medicare Other | Admitting: Family

## 2020-03-11 ENCOUNTER — Other Ambulatory Visit: Payer: Self-pay

## 2020-03-11 VITALS — BP 118/78 | HR 105 | Temp 97.4°F | Ht 65.5 in | Wt 159.0 lb

## 2020-03-11 DIAGNOSIS — E162 Hypoglycemia, unspecified: Secondary | ICD-10-CM

## 2020-03-11 DIAGNOSIS — H6591 Unspecified nonsuppurative otitis media, right ear: Secondary | ICD-10-CM | POA: Diagnosis not present

## 2020-03-11 DIAGNOSIS — H65191 Other acute nonsuppurative otitis media, right ear: Secondary | ICD-10-CM | POA: Diagnosis not present

## 2020-03-11 DIAGNOSIS — E785 Hyperlipidemia, unspecified: Secondary | ICD-10-CM | POA: Diagnosis not present

## 2020-03-11 DIAGNOSIS — H6691 Otitis media, unspecified, right ear: Secondary | ICD-10-CM | POA: Insufficient documentation

## 2020-03-11 LAB — POCT GLYCOSYLATED HEMOGLOBIN (HGB A1C): Hemoglobin A1C: 5 % (ref 4.0–5.6)

## 2020-03-11 LAB — GLUCOSE, POCT (MANUAL RESULT ENTRY): POC Glucose: 80 mg/dl (ref 70–99)

## 2020-03-11 MED ORDER — PREDNISONE 10 MG PO TABS: ORAL_TABLET | ORAL | 0 refills | Status: DC

## 2020-03-11 NOTE — Progress Notes (Signed)
Subjective:    Patient ID: Jill Michael, female    DOB: Jan 27, 1951, 69 y.o.   MRN: [id number]  CC: Jill Michael is a 69 y.o. female who presents today for an acute visit.    HPI: Discuss low glucose on recent lab work Lightheaded on occasion, for past couple of months.  Has been doing intermittent fasting, 15 hours, for past year.  Eats at 530pm, and wont eat until 8/9 am the following day. Has noticed feeling lightheaded on occasion, generally around mid mid morning and  330-4pm. Also noticed if turns head to quickly or gets up too quickly may feel 'subtle' dizzy.   Yesterday since seeing low glucose, she has stopped long periods of fasting and ate a half a cup of yogurt at bedtime and then other half of yogurt in the morning with half cup coffee, cup green tea decaf, and glass water. Breakfast consists of cereal with half banana. Fruit snack at 11am, and has lunch an hour later, usually [place] burger, hlaf avocado. Urine is pale.   Complains of  pressure in right ear which first noticed today. Has been using mucinex, now on allegra. Endorses nasal drainage.   No cp, syncope, vertigo, palpitations, vision changes, vertigo, falls, ear discharge, fever.  HTN- compliant with amlodipine 2.5mg .   HLD- taking crestor 2.5mg  once per week     01/2020 Cardiology - [place] repeat echo in 3 months  HISTORY:  Past Medical History:  Diagnosis Date  . Allergy    hay fever  . Arthritis   . Arthritis    right knee, followed by Jefm Bryant  . Chicken pox   . Low back pain    recurrent  . Osteopenia   . Scoliosis   . Scoliosis   . UTI (urinary tract infection)   . Vaginal atrophy    Past Surgical History:  Procedure Laterality Date  . BREAST BIOPSY Right 2000 and 2005   fibroadenoma right breast x2- both Bx's done under stereo  . NASAL SEPTUM SURGERY    . partial knee replacement Right 2013   Dr. Jefm Bryant  . semithyroidectomy of right side  08/2106  . TUBAL LIGATION    .  VAGINAL DELIVERY     2   Family History  Problem Relation Age of Onset  . Cancer Mother        breast  . Breast cancer Mother 26  . Cancer Father        lung  . CAD Brother     Allergies: Amitriptyline, Gabapentin, Penicillin v potassium, and Penicillins Current Outpatient Medications on File Prior to Visit  Medication Sig Dispense Refill  . amLODipine (NORVASC) 2.5 MG tablet Take 1 tablet (2.5 mg total) by mouth daily. 90 tablet 3  . Cholecalciferol (VITAMIN D) 2000 UNITS CAPS Take 1 capsule by mouth daily.    . clobetasol ointment (TEMOVATE) 0.05 % Apply to AAs vaginal area starting BID until symptoms improve. Then use 2-3x/wk. Avoid face.    . cycloSPORINE (RESTASIS) 0.05 % ophthalmic emulsion Place 1 drop into both eyes 2 (two) times daily. 180 each 3  . dexamethasone (DECADRON) 0.5 MG/5ML solution See admin instructions.    Marland Kitchen estradiol (ESTRACE) 0.1 MG/GM vaginal cream Use intravaginally 1-3 times per week. 42.5 g 11  . fexofenadine (ALLEGRA) 30 MG tablet Take 30 mg 2 (two) times daily by mouth.    . flurandrenolide (CORDRAN) 0.05 % lotion Apply topically daily as needed. Do not use on face. [place]  mL 1  . fluticasone (FLONASE) 50 MCG/ACT nasal spray Place 2 sprays into both nostrils daily. 50 g 5  . FLUZONE HIGH-DOSE QUADRIVALENT 0.7 ML SUSY TO BE ADMINISTERED BY PHARMACIST FOR IMMUNIZATION    . Hyaluronic Acid-Vitamin C (HYALURONIC ACID PO) Take 2 capsules by mouth daily.    Marland Kitchen levothyroxine (SYNTHROID) 25 MCG tablet TAKE 1 TABLET BY MOUTH EVERY MORNING 90 tablet 3  . Omega-3 Fatty Acids (FISH OIL) 1000 MG CAPS Take 1 capsule by mouth.    Marland Kitchen OVER THE COUNTER MEDICATION Take 3 capsules by mouth daily. Instaflex    . OVER THE COUNTER MEDICATION Take 1 capsule by mouth 2 (two) times daily. Tumeric    . rosuvastatin (CRESTOR) 5 MG tablet Take 1 tablet (5 mg total) by mouth at bedtime. 90 tablet 3  . silver sulfADIAZINE (SILVADENE) 1 % cream Apply 1 application topically daily. 50 g 1    No current facility-administered medications on file prior to visit.    Social History   Tobacco Use  . Smoking status: Never Smoker  . Smokeless tobacco: Never Used  Substance Use Topics  . Alcohol use: Yes    Comment: occasional  . Drug use: Yes    Review of Systems  Constitutional: Negative for chills and fever.  HENT: Positive for congestion and ear pain. Negative for ear discharge, sinus pain and sore throat.   Eyes: Negative for visual disturbance.  Respiratory: Negative for cough.   Cardiovascular: Negative for chest pain and palpitations.  Gastrointestinal: Negative for nausea and vomiting.  Neurological: Positive for dizziness. Negative for seizures, syncope and headaches.      Objective:    BP 118/78   Pulse (!) 105   Temp (!) 97.4 F (36.3 C) (Temporal)   Ht 5' 5.5" (1.664 m)   Wt 159 lb (72.1 kg)   SpO2 97%   BMI 26.06 kg/m    Physical Exam Vitals reviewed.  Constitutional:      Appearance: She is well-developed.  HENT:     Head: Normocephalic and atraumatic.     Right Ear: Hearing, ear canal and external ear normal. No decreased hearing noted. No drainage, swelling or tenderness. A middle ear effusion is present. No foreign body. Tympanic membrane is not injected, erythematous or bulging.     Left Ear: Hearing, tympanic membrane, ear canal and external ear normal. No decreased hearing noted. No drainage, swelling or tenderness.  No middle ear effusion. No foreign body. Tympanic membrane is not erythematous or bulging.     Nose: Nose normal. No rhinorrhea.     Right Sinus: No maxillary sinus tenderness or frontal sinus tenderness.     Left Sinus: No maxillary sinus tenderness or frontal sinus tenderness.     Mouth/Throat:     Pharynx: Uvula midline. No oropharyngeal exudate or posterior oropharyngeal erythema.     Tonsils: No tonsillar abscesses.  Eyes:     Conjunctiva/sclera: Conjunctivae normal.  Cardiovascular:     Rate and Rhythm: Regular  rhythm.     Pulses: Normal pulses.     Heart sounds: Normal heart sounds.  Pulmonary:     Effort: Pulmonary effort is normal.     Breath sounds: Normal breath sounds. No wheezing, rhonchi or rales.  Lymphadenopathy:     Head:     Right side of head: No submental, submandibular, tonsillar, preauricular, posterior auricular or occipital adenopathy.     Left side of head: No submental, submandibular, tonsillar, preauricular, posterior auricular or occipital adenopathy.  Cervical: No cervical adenopathy.  Skin:    General: Skin is warm and dry.  Neurological:     Mental Status: She is alert.  Psychiatric:        Speech: Speech normal.        Behavior: Behavior normal.        Thought Content: Thought content normal.        Assessment & Plan:   Problem List Items Addressed This Visit      Nervous and Auditory   Right otitis media    Suspect right ear effusion is contributory dizziness. She doesn't appear to have vertigo, less likely considering BPPV.  We will do a short course of prednisone for right ear effusion, eustachian tube.  Patient not appear to be orthostatic on exam today.  Reassured, normal neurologic exam.  She will let me know how she is doing.  We discussed consideration of stopping low-dose amlodipine if dizziness is not resolved after prednisone.  Patient will check in with me and let me know how she is doing after prednisone.  Of note, we also discussed fasting, and I advised against a long fast , and patient has already stopped.  Advised to continue eating a small meal after dinner and first thing in the morning to stabilize her blood sugar.  Patient verbalized understanding of all       Other Visit Diagnoses    Hypoglycemia    -  Primary   Relevant Orders   POCT HgB A1C (Completed)   POCT Glucose (CBG) (Completed)   Hyperlipidemia, unspecified hyperlipidemia type       Relevant Orders   Lipid panel   Acute effusion of right ear       Relevant Medications    predniSONE (DELTASONE) 10 MG tablet        I have discontinued [name]. Fleener's cefdinir and predniSONE. I am also having her start on predniSONE. Additionally, I am having her maintain her Vitamin D, OVER THE COUNTER MEDICATION, Hyaluronic Acid-Vitamin C (HYALURONIC ACID PO), OVER THE COUNTER MEDICATION, cycloSPORINE, flurandrenolide, fexofenadine, silver sulfADIAZINE, dexamethasone, Fluzone High-Dose Quadrivalent, Fish Oil, amLODipine, fluticasone, estradiol, clobetasol ointment, levothyroxine, and rosuvastatin.   Meds ordered this encounter  Medications  . predniSONE (DELTASONE) 10 MG tablet    Sig: Take 40 mg by mouth on day 1, then taper 10 mg daily until gone    Dispense:  10 tablet    Refill:  0    Order Specific Question:   Supervising Provider    Answer:   Crecencio Mc [[id number]]    Return precautions given.   Risks, benefits, and alternatives of the medications and treatment plan prescribed today were discussed, and patient expressed understanding.   Education regarding symptom management and diagnosis given to patient on AVS.  Continue to follow with Burnard Hawthorne, FNP for routine health maintenance.   Kamela June Takahashi and I agreed with plan.   Mable Paris, FNP

## 2020-03-11 NOTE — Patient Instructions (Signed)
Start and complete prednisone taper  After a week if still dizzy, then trial stop amlodipine and keep BP log.   Let me know how you are.

## 2020-03-11 NOTE — Assessment & Plan Note (Signed)
Suspect right ear effusion is contributory dizziness. She doesn't appear to have vertigo, less likely considering BPPV.  We will do a short course of prednisone for right ear effusion, eustachian tube.  Patient not appear to be orthostatic on exam today.  Reassured, normal neurologic exam.  She will let me know how she is doing.  We discussed consideration of stopping low-dose amlodipine if dizziness is not resolved after prednisone.  Patient will check in with me and let me know how she is doing after prednisone.  Of note, we also discussed fasting, and I advised against a long fast , and patient has already stopped.  Advised to continue eating a small meal after dinner and first thing in the morning to stabilize her blood sugar.  Patient verbalized understanding of all

## 2020-03-11 NOTE — Telephone Encounter (Signed)
Patient scheduled today in same day slot.

## 2020-03-19 ENCOUNTER — Other Ambulatory Visit: Payer: Self-pay

## 2020-03-19 ENCOUNTER — Encounter: Payer: Self-pay | Admitting: Family

## 2020-03-19 DIAGNOSIS — E041 Nontoxic single thyroid nodule: Secondary | ICD-10-CM

## 2020-03-19 MED ORDER — LEVOTHYROXINE SODIUM 25 MCG PO TABS: 25.0000 ug | ORAL_TABLET | Freq: Every morning | ORAL | 3 refills | Status: DC

## 2020-04-03 ENCOUNTER — Other Ambulatory Visit: Payer: Medicare Other

## 2020-04-08 ENCOUNTER — Other Ambulatory Visit (INDEPENDENT_AMBULATORY_CARE_PROVIDER_SITE_OTHER): Payer: Medicare Other

## 2020-04-08 ENCOUNTER — Other Ambulatory Visit: Payer: Self-pay

## 2020-04-08 DIAGNOSIS — E785 Hyperlipidemia, unspecified: Secondary | ICD-10-CM

## 2020-04-08 LAB — LIPID PANEL
Cholesterol: 138 mg/dL (ref 0–200)
HDL: 48.6 mg/dL (ref >39.00)
LDL Cholesterol: 79 mg/dL (ref 0–99)
NonHDL: 89.24
Total CHOL/HDL Ratio: 3
Triglycerides: 52 mg/dL (ref 0.0–149.0)
VLDL: 10.4 mg/dL (ref 0.0–40.0)

## 2020-04-15 ENCOUNTER — Other Ambulatory Visit: Payer: Self-pay | Admitting: Orthopaedic Surgery

## 2020-04-15 DIAGNOSIS — M542 Cervicalgia: Secondary | ICD-10-CM

## 2020-04-23 ENCOUNTER — Encounter: Payer: Self-pay | Admitting: Family

## 2020-05-15 ENCOUNTER — Other Ambulatory Visit: Payer: Medicare Other

## 2020-05-16 ENCOUNTER — Other Ambulatory Visit: Payer: Self-pay

## 2020-05-16 ENCOUNTER — Ambulatory Visit
Admission: RE | Admit: 2020-05-16 | Discharge: 2020-05-16 | Disposition: A | Discharge status: Home / Self Care | Payer: Medicare Other | Source: Ambulatory Visit | Attending: Orthopaedic Surgery | Admitting: Orthopaedic Surgery

## 2020-05-16 DIAGNOSIS — M542 Cervicalgia: Secondary | ICD-10-CM

## 2020-05-18 ENCOUNTER — Other Ambulatory Visit: Payer: Self-pay

## 2020-05-18 ENCOUNTER — Other Ambulatory Visit: Payer: Medicare Other

## 2020-05-18 ENCOUNTER — Ambulatory Visit (INDEPENDENT_AMBULATORY_CARE_PROVIDER_SITE_OTHER): Payer: Medicare Other | Admitting: Family

## 2020-05-18 VITALS — BP 130/74 | HR 98 | Temp 96.6°F | Resp 16 | Wt 157.4 lb

## 2020-05-18 DIAGNOSIS — R197 Diarrhea, unspecified: Secondary | ICD-10-CM | POA: Insufficient documentation

## 2020-05-18 DIAGNOSIS — R140 Abdominal distension (gaseous): Secondary | ICD-10-CM | POA: Diagnosis not present

## 2020-05-18 NOTE — Progress Notes (Signed)
Subjective:    Patient ID: Jill Michael, female    DOB: [birth year], 69 y.o.   MRN: [id number]  CC: Jill Michael is a 69 y.o. female who presents today for follow up.   HPI: Episodes of nausea and cramping which may last 1-2 days, then will have liquid brown diarrhea. Has episodes when intermittent constipation as well.  Improved with eating baked potatoe and avocado. Stool more formed when has pepto bismal.  Has elimininated  frustose from diet with resolution of symptoms over the past 4 weeks.  Eating less fiber to avoid fructose. Taking citracel. Eating small amounts of gluten.   Upcoming appointment with Dr Jill Michael for colonoscopy.  No unusual weight loss, night sweats. Bloating resolved.    HISTORY:  Past Medical History:  Diagnosis Date  . Allergy    hay fever  . Arthritis   . Arthritis    right knee, followed by Jill Michael  . Chicken pox   . Low back pain    recurrent  . Osteopenia   . Scoliosis   . Scoliosis   . UTI (urinary tract infection)   . Vaginal atrophy    Past Surgical History:  Procedure Laterality Date  . BREAST BIOPSY Right 2000 and 2005   fibroadenoma right breast x2- both Bx's done under stereo  . NASAL SEPTUM SURGERY    . partial knee replacement Right 2013   Dr. Jefm Michael  . semithyroidectomy of right side  08/2106  . TUBAL LIGATION    . VAGINAL DELIVERY     2   Family History  Problem Relation Age of Onset  . Cancer Mother        breast  . Breast cancer Mother 61  . Cancer Father        lung  . CAD Brother     Allergies: Amitriptyline, Gabapentin, Penicillin v potassium, and Penicillins Current Outpatient Medications on File Prior to Visit  Medication Sig Dispense Refill  . amLODipine (NORVASC) 2.5 MG tablet Take 1 tablet (2.5 mg total) by mouth daily. 90 tablet 3  . Cholecalciferol (VITAMIN D) 2000 UNITS CAPS Take 1 capsule by mouth daily.    . clobetasol ointment (TEMOVATE) 0.05 % Apply to AAs vaginal area starting BID until  symptoms improve. Then use 2-3x/wk. Avoid face.    . cycloSPORINE (RESTASIS) 0.05 % ophthalmic emulsion Place 1 drop into both eyes 2 (two) times daily. 180 each 3  . dexamethasone (DECADRON) 0.5 MG/5ML solution See admin instructions.    Marland Kitchen estradiol (ESTRACE) 0.1 MG/GM vaginal cream Use intravaginally 1-3 times per week. 42.5 g 11  . fexofenadine (ALLEGRA) 30 MG tablet Take 30 mg 2 (two) times daily by mouth.    . flurandrenolide (CORDRAN) 0.05 % lotion Apply topically daily as needed. Do not use on face. 60 mL 1  . fluticasone (FLONASE) 50 MCG/ACT nasal spray Place 2 sprays into both nostrils daily. 50 g 5  . FLUZONE HIGH-DOSE QUADRIVALENT 0.7 ML SUSY TO BE ADMINISTERED BY PHARMACIST FOR IMMUNIZATION    . Hyaluronic Acid-Vitamin C (HYALURONIC ACID PO) Take 2 capsules by mouth daily.    Marland Kitchen levothyroxine (SYNTHROID) 25 MCG tablet Take 1 tablet (25 mcg total) by mouth every morning. 90 tablet 3  . Omega-3 Fatty Acids (FISH OIL) 1000 MG CAPS Take 1 capsule by mouth.    Marland Kitchen OVER THE COUNTER MEDICATION Take 3 capsules by mouth daily. Instaflex    . OVER THE COUNTER MEDICATION Take 1 capsule by mouth 2 (  two) times daily. Tumeric    . predniSONE (DELTASONE) 10 MG tablet Take 40 mg by mouth on day 1, then taper 10 mg daily until gone 10 tablet 0  . rosuvastatin (CRESTOR) 5 MG tablet Take 1 tablet (5 mg total) by mouth at bedtime. 90 tablet 3  . silver sulfADIAZINE (SILVADENE) 1 % cream Apply 1 application topically daily. 50 g 1   No current facility-administered medications on file prior to visit.    Social History   Tobacco Use  . Smoking status: Never Smoker  . Smokeless tobacco: Never Used  Vaping Use  . Vaping Use: Never used  Substance Use Topics  . Alcohol use: Yes    Comment: occasional  . Drug use: Yes    Review of Systems  Constitutional: Negative for chills, fever and unexpected weight change.  Respiratory: Negative for cough.   Cardiovascular: Negative for chest pain and  palpitations.  Gastrointestinal: Negative for abdominal distention, abdominal pain, constipation, diarrhea, nausea and vomiting.      Objective:    BP 130/74   Pulse 98   Temp (!) 96.6 F (35.9 C)   Resp 16   Wt 157 lb 6.4 oz (71.4 kg)   SpO2 97%   BMI 25.79 kg/m  BP Readings from Last 3 Encounters:  05/18/20 130/74  03/11/20 118/78  09/17/19 140/86   Wt Readings from Last 3 Encounters:  05/18/20 157 lb 6.4 oz (71.4 kg)  03/11/20 159 lb (72.1 kg)  01/15/20 153 lb 1.9 oz (69.5 kg)    Physical Exam Vitals reviewed.  Constitutional:      Appearance: Normal appearance. She is well-developed.  Eyes:     Conjunctiva/sclera: Conjunctivae normal.  Cardiovascular:     Rate and Rhythm: Normal rate and regular rhythm.     Pulses: Normal pulses.     Heart sounds: Normal heart sounds.  Pulmonary:     Effort: Pulmonary effort is normal.     Breath sounds: Normal breath sounds. No wheezing, rhonchi or rales.  Abdominal:     General: Bowel sounds are normal. There is no distension.     Palpations: Abdomen is soft. Abdomen is not rigid. There is no fluid wave or mass.     Tenderness: There is no abdominal tenderness. There is no guarding or rebound.     Comments: nondistended abdomen  Skin:    General: Skin is warm and dry.  Neurological:     Mental Status: She is alert.  Psychiatric:        Speech: Speech normal.        Behavior: Behavior normal.        Thought Content: Thought content normal.        Assessment & Plan:   Problem List Items Addressed This Visit      Other   Diarrhea - Primary    Patient well-appearing.  Benign abdominal exam.  Bloating, diarrhea, cramping all resolved elimination of fructose from diet.  Has upcoming GI consult.  Advised for completion, repeat CT abdomen pelvis with CA-125 today due to history of bloating.  Patient  declines repeat of CT of abdomen pelvis.  She like to pursue CA-125 and if bleeding recurs, she will let me know immediately  so we can pursue CT abdomen pelvis  to evaluate for ovarian pathology.      Relevant Orders   CA 125    Other Visit Diagnoses    Bloating       Relevant Orders  CA 125       I am having Jill Michael maintain her Vitamin D, OVER THE COUNTER MEDICATION, Hyaluronic Acid-Vitamin C (HYALURONIC ACID PO), OVER THE COUNTER MEDICATION, cycloSPORINE, flurandrenolide, fexofenadine, silver sulfADIAZINE, dexamethasone, Fluzone High-Dose Quadrivalent, Fish Oil, amLODipine, fluticasone, estradiol, clobetasol ointment, rosuvastatin, predniSONE, and levothyroxine.   No orders of the defined types were placed in this encounter.   Return precautions given.   Risks, benefits, and alternatives of the medications and treatment plan prescribed today were discussed, and patient expressed understanding.   Education regarding symptom management and diagnosis given to patient on AVS.  Continue to follow with Burnard Hawthorne, FNP for routine health maintenance.   Denae June Labo and I agreed with plan.   Mable Paris, FNP

## 2020-05-18 NOTE — Patient Instructions (Signed)
We will look at CA-125 today. As discussed, if bloating recurs or abdominal pain, please let me know as I would like to repeat CT of the abdomen at that time

## 2020-05-18 NOTE — Assessment & Plan Note (Signed)
Patient well-appearing.  Benign abdominal exam.  Bloating, diarrhea, cramping all resolved elimination of fructose from diet.  Has upcoming GI consult.  Advised for completion, repeat CT abdomen pelvis with CA-125 today due to history of bloating.  Patient  declines repeat of CT of abdomen pelvis.  She like to pursue CA-125 and if bleeding recurs, she will let me know immediately so we can pursue CT abdomen pelvis  to evaluate for ovarian pathology.

## 2020-05-19 ENCOUNTER — Encounter: Payer: Self-pay | Admitting: Family

## 2020-05-19 LAB — CA 125: CA 125: 4 U/mL (ref <35)

## 2020-05-25 ENCOUNTER — Ambulatory Visit: Payer: Medicare Other | Admitting: Family

## 2020-05-27 ENCOUNTER — Telehealth: Payer: Self-pay | Admitting: Family

## 2020-05-27 NOTE — Telephone Encounter (Signed)
Pt called in and said she think she has an ear infection. Her balance is off and she has drainage off and on. I tried scheduling a virtual tomorrow with one of the providers that has open schedule but she wants to come in so someone can see her ears. I offered for her to go to Urgent Care so they can look at her ears and she would like a nurse to call back to discuss her options.

## 2020-05-27 NOTE — Telephone Encounter (Signed)
Balance is off, nasal congestion, chronic ear pain and drainage.right ear pain PCP recommended allegra, and mucinex. Patient stated she will do virtual appointment tomorrow.

## 2020-05-29 ENCOUNTER — Encounter: Payer: Self-pay | Admitting: Nurse Practitioner

## 2020-05-29 ENCOUNTER — Telehealth (INDEPENDENT_AMBULATORY_CARE_PROVIDER_SITE_OTHER): Payer: Medicare Other | Admitting: Nurse Practitioner

## 2020-05-29 VITALS — BP 130/71 | Temp 96.8°F | Ht 65.5 in | Wt 156.7 lb

## 2020-05-29 DIAGNOSIS — H6591 Unspecified nonsuppurative otitis media, right ear: Secondary | ICD-10-CM

## 2020-05-29 MED ORDER — CEFACLOR 250 MG PO CAPS: 250.0000 mg | ORAL_CAPSULE | Freq: Three times a day (TID) | ORAL | 0 refills | Status: AC

## 2020-05-29 MED ORDER — PREDNISONE 10 MG PO TABS
ORAL_TABLET | ORAL | 0 refills | Status: DC
Start: 2020-05-29 — End: 2020-11-17

## 2020-05-29 NOTE — Patient Instructions (Addendum)
Afrin for 3 days Prednisone 4-3-2-1 off taper.   ENT referral for right ear chronic problems.  Take the Ceclor ONLY  if not improved with Afrin and prednisone after 1 day.   You may not need this- at the pharmacy for you: Antibiotic-ceclor and PCN allergy discussed with Rod Holler. Her PCN allergy started at age 69. She has taken Ceclor several times a few years ago and it is the only antibiotic that works for her ear infections.   Call us back if no improvement.       Eustachian Tube Dysfunction  Eustachian tube dysfunction refers to a condition in which a blockage develops in the narrow passage that connects the middle ear to the back of the nose (eustachian tube). The eustachian tube regulates air pressure in the middle ear by letting air move between the ear and nose. It also helps to drain fluid from the middle ear space. Eustachian tube dysfunction can affect one or both ears. When the eustachian tube does not function properly, air pressure, fluid, or both can build up in the middle ear. What are the causes? This condition occurs when the eustachian tube becomes blocked or cannot open normally. Common causes of this condition include:  Ear infections.  Colds and other infections that affect the nose, mouth, and throat (upper respiratory tract).  Allergies.  Irritation from cigarette smoke.  Irritation from stomach acid coming up into the esophagus (gastroesophageal reflux). The esophagus is the tube that carries food from the mouth to the stomach.  Sudden changes in air pressure, such as from descending in an airplane or scuba diving.  Abnormal growths in the nose or throat, such as: ? Growths that line the nose (nasal polyps). ? Abnormal growth of cells (tumors). ? Enlarged tissue at the back of the throat (adenoids). What increases the risk? You are more likely to develop this condition if:  You smoke.  You are overweight.  You are a child who has: ? Certain birth  defects of the mouth, such as cleft palate. ? Large tonsils or adenoids. What are the signs or symptoms? Common symptoms of this condition include:  A feeling of fullness in the ear.  Ear pain.  Clicking or popping noises in the ear.  Ringing in the ear.  Hearing loss.  Loss of balance.  Dizziness. Symptoms may get worse when the air pressure around you changes, such as when you travel to an area of high elevation, fly on an airplane, or go scuba diving. How is this diagnosed? This condition may be diagnosed based on:  Your symptoms.  A physical exam of your ears, nose, and throat.  Tests, such as those that measure: ? The movement of your eardrum (tympanogram). ? Your hearing (audiometry). How is this treated? Treatment depends on the cause and severity of your condition.  In mild cases, you may relieve your symptoms by moving air into your ears. This is called "popping the ears."  In more severe cases, or if you have symptoms of fluid in your ears, treatment may include: ? Medicines to relieve congestion (decongestants). ? Medicines that treat allergies (antihistamines). ? Nasal sprays or ear drops that contain medicines that reduce swelling (steroids). ? A procedure to drain the fluid in your eardrum (myringotomy). In this procedure, a small tube is placed in the eardrum to:  Drain the fluid.  Restore the air in the middle ear space. ? A procedure to insert a balloon device through the nose to inflate the  opening of the eustachian tube (balloon dilation). Follow these instructions at home: Lifestyle  Do not do any of the following until your health care provider approves: ? Travel to high altitudes. ? Fly in airplanes. ? Work in a Pension scheme manager or room. ? Scuba dive.  Do not use any products that contain nicotine or tobacco, such as cigarettes and e-cigarettes. If you need help quitting, ask your health care provider.  Keep your ears dry. Wear fitted  earplugs during showering and bathing. Dry your ears completely after. General instructions  Take over-the-counter and prescription medicines only as told by your health care provider.  Use techniques to help pop your ears as recommended by your health care provider. These may include: ? Chewing gum. ? Yawning. ? Frequent, forceful swallowing. ? Closing your mouth, holding your nose closed, and gently blowing as if you are trying to blow air out of your nose.  Keep all follow-up visits as told by your health care provider. This is important. Contact a health care provider if:  Your symptoms do not go away after treatment.  Your symptoms come back after treatment.  You are unable to pop your ears.  You have: ? A fever. ? Pain in your ear. ? Pain in your head or neck. ? Fluid draining from your ear.  Your hearing suddenly changes.  You become very dizzy.  You lose your balance. Summary  Eustachian tube dysfunction refers to a condition in which a blockage develops in the eustachian tube.  It can be caused by ear infections, allergies, inhaled irritants, or abnormal growths in the nose or throat.  Symptoms include ear pain, hearing loss, or ringing in the ears.  Mild cases are treated with maneuvers to unblock the ears, such as yawning or ear popping.  Severe cases are treated with medicines. Surgery may also be done (rare). This information is not intended to replace advice given to you by your health care provider. Make sure you discuss any questions you have with your health care provider. Document Revised: 03/13/2018 Document Reviewed: 03/13/2018 Elsevier Patient Education  [place].  Otitis Media, Adult  Otitis media occurs when there is inflammation and fluid in the middle ear. Your middle ear is a part of the ear that contains bones for hearing as well as air that helps send sounds to your brain. What are the causes? This condition is caused by a  blockage in the eustachian tube. This tube drains fluid from the ear to the back of the nose (nasopharynx). A blockage in this tube can be caused by an object or by swelling (edema) in the tube. Problems that can cause a blockage include:  A cold or other upper respiratory infection.  Allergies.  An irritant, such as tobacco smoke.  Enlarged adenoids. The adenoids are areas of soft tissue located high in the back of the throat, behind the nose and the roof of the mouth.  A mass in the nasopharynx.  Damage to the ear caused by pressure changes (barotrauma). What are the signs or symptoms? Symptoms of this condition include:  Ear pain.  A fever.  Decreased hearing.  A headache.  Tiredness (lethargy).  Fluid leaking from the ear.  Ringing in the ear. How is this diagnosed? This condition is diagnosed with a physical exam. During the exam your health care provider will use an instrument called an otoscope to look into your ear and check for redness, swelling, and fluid. He or she will also  ask about your symptoms. Your health care provider may also order tests, such as:  A test to check the movement of the eardrum (pneumatic otoscopy). This test is done by squeezing a small amount of air into the ear.  A test that changes air pressure in the middle ear to check how well the eardrum moves and whether the eustachian tube is working (tympanogram). How is this treated? This condition usually goes away on its own within 3-5 days. But if the condition is caused by a bacteria infection and does not go away own its own, or keeps coming back, your health care provider may:  Prescribe antibiotic medicines to treat the infection.  Prescribe or recommend medicines to control pain. Follow these instructions at home:  Take over-the-counter and prescription medicines only as told by your health care provider.  If you were prescribed an antibiotic medicine, take it as told by your health  care provider. Do not stop taking the antibiotic even if you start to feel better.  Keep all follow-up visits as told by your health care provider. This is important. Contact a health care provider if:  You have bleeding from your nose.  There is a lump on your neck.  You are not getting better in 5 days.  You feel worse instead of better. Get help right away if:  You have severe pain that is not controlled with medicine.  You have swelling, redness, or pain around your ear.  You have stiffness in your neck.  A part of your face is paralyzed.  The bone behind your ear (mastoid) is tender when you touch it.  You develop a severe headache. Summary  Otitis media is redness, soreness, and swelling of the middle ear.  This condition usually goes away on its own within 3-5 days.  If the problem does not go away in 3-5 days, your health care provider may prescribe or recommend medicines to treat your symptoms.  If you were prescribed an antibiotic medicine, take it as told by your health care provider. This information is not intended to replace advice given to you by your health care provider. Make sure you discuss any questions you have with your health care provider. Document Revised: 11/03/2017 Document Reviewed: 11/11/2016 Elsevier Patient Education  2020 [employer].

## 2020-05-29 NOTE — Progress Notes (Signed)
Virtual Visit via Virtual Note  This visit type was conducted due to national recommendations for restrictions regarding the COVID-19 pandemic (e.g. social distancing).  This format is felt to be most appropriate for this patient at this time.  All issues noted in this document were discussed and addressed.  No physical exam was performed (except for noted visual exam findings with Video Visits).   I connected with@ on 05/31/20 at  8:00 AM EDT by a video enabled telemedicine application or telephone and verified that I am speaking with the correct person using two identifiers. Location patient: home Location provider: work or home office Persons participating in the virtual visit: patient, provider    I discussed the limitations, risks, security and privacy concerns of performing an evaluation and management service by telephone and the availability of in person appointments. I also discussed with the patient that there may be a patient responsible charge related to this service. The patient expressed understanding and agreed to proceed.   Reason for visit: allergies and inner ear fullness, balance is off. Known problem. Had Covid vaccines 01/28/2020 and 02/18/2020.  HPI:  This 69 year old patient reports ongoing problems with allergies for several weeks.  The pollen was especially bad at her house and noted with grasses peaking in June.  She is chronically on Flonase, Allegra, and has had to add Mucinex.  She started to feel pressure in her right ear, and postnasal drainage.  She has had mild nasal congestion, and right earache on and off, but no sore throat, fever, or sense that she is ill. Very slight headache starting in the frontal area.  Very slight random dry cough.  She believes the Mucinex is helping.   She has a history of problems with the right ear and had to have tubes in her ears in her 30's.  She gets fluid buildup in the inner ear, that makes her balance get off. No falls.  his  Tuesday, she could feel the pressure build in her inner ear  and her balance is off a little now.  She recognizes this is the same problem that she has had in the past.  She tells me the only antibiotic that will work for her right ear,  if it gets infected,  is Ceclor.  She had a penicillin rash as a 60 yo child, but has tolerated several doses of Ceclor well since then most recently a few years ago.   She has taken steroids in the past for this.  She has not seen ENT in a long time.  She would like to see ENT for allergy flares and to get the inner ear looked at again.  ROS: See pertinent positives and negatives per HPI.  Past Medical History:  Diagnosis Date  . Allergy    hay fever  . Arthritis   . Arthritis    right knee, followed by Jefm Bryant  . Chicken pox   . Low back pain    recurrent  . Osteopenia   . Scoliosis   . Scoliosis   . UTI (urinary tract infection)   . Vaginal atrophy     Past Surgical History:  Procedure Laterality Date  . BREAST BIOPSY Right 2000 and 2005   fibroadenoma right breast x2- both Bx's done under stereo  . NASAL SEPTUM SURGERY    . partial knee replacement Right 2013   Dr. Jefm Bryant  . semithyroidectomy of right side  08/2106  . TUBAL LIGATION    . VAGINAL DELIVERY  2    Family History  Problem Relation Age of Onset  . Cancer Mother        breast  . Breast cancer Mother 63  . Cancer Father        lung  . CAD Brother     SOCIAL HX: Never smoked  Current Outpatient Medications:  .  amLODipine (NORVASC) 2.5 MG tablet, Take 1 tablet (2.5 mg total) by mouth daily., Disp: 90 tablet, Rfl: 3 .  Cholecalciferol (VITAMIN D) 2000 UNITS CAPS, Take 1 capsule by mouth daily., Disp: , Rfl:  .  clobetasol ointment (TEMOVATE) 0.05 %, Apply to AAs vaginal area starting BID until symptoms improve. Then use 2-3x/wk. Avoid face., Disp: , Rfl:  .  cycloSPORINE (RESTASIS) 0.05 % ophthalmic emulsion, Place 1 drop into both eyes 2 (two) times daily., Disp: 180  each, Rfl: 3 .  dexamethasone (DECADRON) 0.5 MG/5ML solution, See admin instructions., Disp: , Rfl:  .  estradiol (ESTRACE) 0.1 MG/GM vaginal cream, Use intravaginally 1-3 times per week., Disp: 42.5 g, Rfl: 11 .  fexofenadine (ALLEGRA) 30 MG tablet, Take 30 mg 2 (two) times daily by mouth., Disp: , Rfl:  .  flurandrenolide (CORDRAN) 0.05 % lotion, Apply topically daily as needed. Do not use on face., Disp: 60 mL, Rfl: 1 .  fluticasone (FLONASE) 50 MCG/ACT nasal spray, Place 2 sprays into both nostrils daily., Disp: 50 g, Rfl: 5 .  levothyroxine (SYNTHROID) 25 MCG tablet, Take 1 tablet (25 mcg total) by mouth every morning., Disp: 90 tablet, Rfl: 3 .  Omega-3 Fatty Acids (FISH OIL) 1000 MG CAPS, Take 1 capsule by mouth., Disp: , Rfl:  .  OVER THE COUNTER MEDICATION, Take 3 capsules by mouth daily. Instaflex, Disp: , Rfl:  .  OVER THE COUNTER MEDICATION, Take 1 capsule by mouth 2 (two) times daily. Tumeric, Disp: , Rfl:  .  rosuvastatin (CRESTOR) 5 MG tablet, Take 1 tablet (5 mg total) by mouth at bedtime., Disp: 90 tablet, Rfl: 3 .  silver sulfADIAZINE (SILVADENE) 1 % cream, Apply 1 application topically daily., Disp: 50 g, Rfl: 1 .  cefaclor (CECLOR) 250 MG capsule, Take 1 capsule (250 mg total) by mouth 3 (three) times daily for 7 days., Disp: 21 capsule, Rfl: 0 .  predniSONE (DELTASONE) 10 MG tablet, Take 40 mg by mouth on day 1, then taper 10 mg daily until gone, Disp: 10 tablet, Rfl: 0  EXAM:  VITALS per patient if applicable:  GENERAL: alert, oriented, appears well and in no acute distress  HEENT: atraumatic, conjunctiva clear, no obvious abnormalities on inspection of external nose and ears  NECK: normal movements of the head and neck  LUNGS: on inspection no signs of respiratory distress, breathing rate appears normal, no obvious gross SOB, gasping or wheezing  CV: no obvious cyanosis  MS: moves all visible extremities without noticeable abnormality  PSYCH/NEURO: pleasant and  cooperative, no obvious depression or anxiety, speech and thought processing grossly intact  ASSESSMENT AND PLAN:  Discussed the following assessment and plan:  Right serous otitis media, unspecified chronicity - Plan: Ambulatory referral to ENT  Suspect right eustachian tube dysfunction from virus versus right serous otitis media, acute on a history of chronic problems. Trial of Afrin for 3 days for local decongestant and open up the eustachian tube.  Begin a short prednisone 4-3-2-1 taper.  Take the Ceclor ONLY  if not improved with Afrin and prednisone after 1 day.   You may not need this- at the  pharmacy for you: Antibiotic-ceclor and PCN allergy discussed with Rod Holler. Her PCN allergy started at age 54. She has taken Ceclor several times a few years ago and it is the only antibiotic that works for her ear infections.   ENT referral for right ear chronic problems. Call us back if no improvement.   No problem-specific Assessment & Plan notes found for this encounter.    I discussed the assessment and treatment plan with the patient. The patient was provided an opportunity to ask questions and all were answered. The patient agreed with the plan and demonstrated an understanding of the instructions.   The patient was advised to call back or seek an in-person evaluation if the symptoms worsen or if the condition fails to improve as anticipated.  I provided 15 minutes of non-face-to-face time during this encounter.  Denice Paradise, NP Adult Nurse Practitioner [facility] [phone]

## 2020-05-31 ENCOUNTER — Encounter: Payer: Self-pay | Admitting: Nurse Practitioner

## 2020-06-09 ENCOUNTER — Telehealth: Payer: Self-pay | Admitting: Family

## 2020-06-09 NOTE — Telephone Encounter (Signed)
Rejection Reason - Patient did not respond - pt did not cb to sched appt" [facility] said about 1 hour ago  "LVM TO SCHEDULE APPT ON 05/29/2020" [facility] said 6 days ago

## 2020-06-11 NOTE — Telephone Encounter (Signed)
Patient verbalized understanding and had no further questions. Jill Michael states that she will give [facility] a call to schedule an appointment.

## 2020-06-11 NOTE — Telephone Encounter (Signed)
Call pt Please encourage her to call [facility] to schedule an appt Appears she  Missed phone call

## 2020-06-23 ENCOUNTER — Telehealth: Payer: Self-pay

## 2020-06-23 ENCOUNTER — Ambulatory Visit (INDEPENDENT_AMBULATORY_CARE_PROVIDER_SITE_OTHER): Payer: Medicare Other

## 2020-06-23 VITALS — BP 119/77 | HR 84 | Ht 65.5 in | Wt 156.0 lb

## 2020-06-23 DIAGNOSIS — E041 Nontoxic single thyroid nodule: Secondary | ICD-10-CM

## 2020-06-23 DIAGNOSIS — N952 Postmenopausal atrophic vaginitis: Secondary | ICD-10-CM

## 2020-06-23 DIAGNOSIS — R030 Elevated blood-pressure reading, without diagnosis of hypertension: Secondary | ICD-10-CM

## 2020-06-23 DIAGNOSIS — Z0000 Encounter for general adult medical examination without abnormal findings: Secondary | ICD-10-CM

## 2020-06-23 MED ORDER — LEVOTHYROXINE SODIUM 25 MCG PO TABS: 25.0000 ug | ORAL_TABLET | Freq: Every morning | ORAL | 1 refills | Status: DC

## 2020-06-23 MED ORDER — AMLODIPINE BESYLATE 2.5 MG PO TABS: 2.5000 mg | ORAL_TABLET | Freq: Every day | ORAL | 1 refills | Status: DC

## 2020-06-23 MED ORDER — ROSUVASTATIN CALCIUM 5 MG PO TABS: 5.0000 mg | ORAL_TABLET | Freq: Every day | ORAL | 1 refills | Status: DC

## 2020-06-23 MED ORDER — ESTRADIOL 0.1 MG/GM VA CREA: TOPICAL_CREAM | VAGINAL | 11 refills | Status: DC

## 2020-06-23 MED ORDER — FLUTICASONE PROPIONATE 50 MCG/ACT NA SUSP: 2.0000 | Freq: Every day | NASAL | 1 refills | Status: DC

## 2020-06-23 NOTE — Progress Notes (Addendum)
Subjective:   Jill Michael is a 69 y.o. female who presents for Medicare Annual (Subsequent) preventive examination.  Review of Systems    No ROS.  Medicare Wellness Virtual Visit.    Cardiac Risk Factors include: advanced age (>54men, >50 women)     Objective:    Today's Vitals   06/23/20 1102  BP: 119/77  Pulse: 84  Weight: 156 lb (70.8 kg)  Height: 5' 5.5" (1.664 m)   Body mass index is 25.56 kg/m.  Advanced Directives 06/23/2020 09/09/2019 09/09/2019 06/21/2019 12/16/2015 08/17/2015 06/10/2015  Does Patient Have a Medical Advance Directive? No No No No No No No  Would patient like information on creating a medical advance directive? No - Patient declined No - Patient declined No - Patient declined No - Patient declined No - patient declined information No - patient declined information No - patient declined information    Current Medications (verified) Outpatient Encounter Medications as of 06/23/2020  Medication Sig  . amLODipine (NORVASC) 2.5 MG tablet Take 1 tablet (2.5 mg total) by mouth daily.  . Cholecalciferol (VITAMIN D) 2000 UNITS CAPS Take 1 capsule by mouth daily.  . clobetasol ointment (TEMOVATE) 0.05 % Apply to AAs vaginal area starting BID until symptoms improve. Then use 2-3x/wk. Avoid face.  . cycloSPORINE (RESTASIS) 0.05 % ophthalmic emulsion Place 1 drop into both eyes 2 (two) times daily.  Marland Kitchen dexamethasone (DECADRON) 0.5 MG/5ML solution See admin instructions.  Marland Kitchen estradiol (ESTRACE) 0.1 MG/GM vaginal cream Use intravaginally 1-3 times per week.  . fexofenadine (ALLEGRA) 30 MG tablet Take 30 mg 2 (two) times daily by mouth.  . flurandrenolide (CORDRAN) 0.05 % lotion Apply topically daily as needed. Do not use on face.  . fluticasone (FLONASE) 50 MCG/ACT nasal spray Place 2 sprays into both nostrils daily.  Marland Kitchen levothyroxine (SYNTHROID) 25 MCG tablet Take 1 tablet (25 mcg total) by mouth every morning.  . Omega-3 Fatty Acids (FISH OIL) 1000 MG CAPS Take 1  capsule by mouth.  Marland Kitchen OVER THE COUNTER MEDICATION Take 3 capsules by mouth daily. Instaflex  . OVER THE COUNTER MEDICATION Take 1 capsule by mouth 2 (two) times daily. Tumeric  . predniSONE (DELTASONE) 10 MG tablet Take 40 mg by mouth on day 1, then taper 10 mg daily until gone  . rosuvastatin (CRESTOR) 5 MG tablet Take 1 tablet (5 mg total) by mouth at bedtime.  . silver sulfADIAZINE (SILVADENE) 1 % cream Apply 1 application topically daily.   No facility-administered encounter medications on file as of 06/23/2020.    Allergies (verified) Amitriptyline, Gabapentin, Penicillin v potassium, and Penicillins   History: Past Medical History:  Diagnosis Date  . Allergy    hay fever  . Arthritis   . Arthritis    right knee, followed by Jefm Bryant  . Chicken pox   . Low back pain    recurrent  . Osteopenia   . Scoliosis   . Scoliosis   . UTI (urinary tract infection)   . Vaginal atrophy    Past Surgical History:  Procedure Laterality Date  . BREAST BIOPSY Right 2000 and 2005   fibroadenoma right breast x2- both Bx's done under stereo  . NASAL SEPTUM SURGERY    . partial knee replacement Right 2013   Dr. Jefm Bryant  . semithyroidectomy of right side  08/2106  . TUBAL LIGATION    . VAGINAL DELIVERY     2   Family History  Problem Relation Age of Onset  . Cancer Mother  breast  . Breast cancer Mother 91  . Cancer Father        lung  . CAD Brother    Social History   Socioeconomic History  . Marital status: Married    Spouse name: Not on file  . Number of children: Not on file  . Years of education: Not on file  . Highest education level: Not on file  Occupational History  . Not on file  Tobacco Use  . Smoking status: Never Smoker  . Smokeless tobacco: Never Used  Vaping Use  . Vaping Use: Never used  Substance and Sexual Activity  . Alcohol use: Yes    Comment: occasional  . Drug use: Yes  . Sexual activity: Not Currently    Birth control/protection:  Post-menopausal  Other Topics Concern  . Not on file  Social History Narrative   Lives in [place]. 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 She won her appeal for LT disability (12/2016).       Diet - regular, healthy; swims 4-5x per week   Exercise - none, walked previously   Social Determinants of Health   Financial Resource Strain:   . Difficulty of Paying Living Expenses:   Food Insecurity:   . Worried About Charity fundraiser in the Last Year:   . Arboriculturist in the Last Year:   Transportation Needs:   . Film/video editor (Medical):   Marland Kitchen Lack of Transportation (Non-Medical):   Physical Activity:   . Days of Exercise per Week:   . Minutes of Exercise per Session:   Stress:   . Feeling of Stress :   Social Connections:   . Frequency of Communication with Friends and Family:   . Frequency of Social Gatherings with Friends and Family:   . Attends Religious Services:   . Active Member of Clubs or Organizations:   . Attends Archivist Meetings:   Marland Kitchen Marital Status:     Tobacco Counseling Counseling given: Not Answered   Clinical Intake:  Pre-visit preparation completed: Yes        Diabetes: No  How often do you need to have someone help you when you read instructions, pamphlets, or other written materials from your doctor or pharmacy?: 1 - Never   Interpreter Needed?: No      Activities of Daily Living In your present state of health, do you have any difficulty performing the following activities: 06/23/2020  Hearing? N  Vision? N  Difficulty concentrating or making decisions? N  Walking or climbing stairs? N  Dressing or bathing? N  Doing errands, shopping? N  Preparing Food and eating ? N  Using the Toilet? N  In the past six months, have you accidently leaked urine? N  Do you have problems with loss of bowel control? N  Managing your Medications? N  Managing your Finances? N    Housekeeping or managing your Housekeeping? N  Some recent data might be hidden    Patient Care Team: Burnard Hawthorne, FNP as PCP - General (Family Medicine)  Indicate any recent Medical Services you may have received from other than [facility] providers in the past year (date may be approximate).     Assessment:   This is a routine wellness examination for Jill Michael.  I connected with Lark today by telephone and verified that I am speaking with the correct person using two identifiers. Location patient:  home Location provider: work Persons participating in the virtual visit: patient, nurse.    I discussed the limitations, risks, security and privacy concerns of performing an evaluation and management service by telephone and the availability of in person appointments. The patient expressed understanding and verbally consented to this telephonic visit.    Interactive audio and video telecommunications were attempted between this provider and patient, however failed, due to patient having technical difficulties OR patient did not have access to video capability.  We continued and completed visit with audio only.  Some vital signs may be absent or patient reported.   Hearing/Vision screen  Hearing Screening   125Hz  250Hz  500Hz  1000Hz  2000Hz  3000Hz  4000Hz  6000Hz  8000Hz   Right ear:           Left ear:           Comments: Patient is able to hear conversational tones without difficulty.  No issues reported.  Vision Screening Comments: Followed by  Wears corrective lenses Cataract extraction, bilateral Visual acuity not assessed, virtual visit.  They have seen their ophthalmologist in the last 12 months.     Dietary issues and exercise activities discussed: Current Exercise Habits: Home exercise routine, Type of exercise: stretching;strength training/weights;walking, Time (Minutes): 45, Frequency (Times/Week): 5, Weekly Exercise (Minutes/Week): 225, Intensity: MildFodmap diet Monitors  fructose intake Good intake  Goals      Patient Stated   .  Follow up with Primary Care Provider (pt-stated)      Keep routine maintenance appointments  Eat healthy      Depression Screen PHQ 2/9 Scores 06/23/2020 08/22/2019 06/21/2019 04/17/2017 01/10/2017 09/13/2016 12/16/2015  PHQ - 2 Score 0 0 0 0 0 0 0  PHQ- 9 Score - 0 - - - - -    Fall Risk Fall Risk  06/23/2020 05/29/2020 11/25/2019 06/21/2019 04/17/2017  Falls in the past year? 0 0 0 0 No  Number falls in past yr: 0 - - - -  Comment - - - - -  Injury with Fall? 0 - - - -  Follow up Falls evaluation completed Falls evaluation completed Falls evaluation completed - -   Handrails in use when climbing stairs? Yes  Home free of loose throw rugs in walkways, pet beds, electrical cords, etc? Yes  Adequate lighting in your home to reduce risk of falls? Yes   ASSISTIVE DEVICES UTILIZED TO PREVENT FALLS:  Life alert? No  Use of a cane, walker or w/c? No  Grab bars in the bathroom? No  Shower chair or bench in shower? No  Elevated toilet seat or a handicapped toilet? No   TIMED UP AND GO:  Was the test performed? No . Virtual visit  Cognitive Function:     6CIT Screen 06/21/2019  What Year? 0 points  What month? 0 points  What time? 0 points  Count back from 20 0 points  Months in reverse 0 points  Repeat phrase 0 points  Total Score 0    Immunizations Immunization History  Administered Date(s) Administered  . Influenza, High Dose Seasonal PF 08/19/2017, 08/19/2018, 07/19/2019  . Influenza-Unspecified 08/29/2014, 10/19/2015, 09/07/2016, 08/28/2017  . PFIZER SARS-COV-2 Vaccination 01/28/2020, 02/18/2020  . Pneumococcal Conjugate-13 11/17/2017  . Pneumococcal Polysaccharide-23 11/19/2018  . Td 09/17/2019  . Tdap 07/16/2010  . Zoster 04/10/2015    Health Maintenance Health Maintenance  Topic Date Due  . COLONOSCOPY  02/06/2020  . INFLUENZA VACCINE  07/05/2020  . MAMMOGRAM  12/30/2021  . TETANUS/TDAP  09/16/2029   .  DEXA SCAN  Completed  . COVID-19 Vaccine  Completed  . Hepatitis C Screening  Completed  . PNA vac Low Risk Adult  Completed   Dental Screening: Recommended annual dental exams for proper oral hygiene. Visits every 6 months.   Community Resource Referral / Chronic Care Management: CRR required this visit?  No   CCM required this visit?  No      Plan:   Keep all routine maintenance appointments.   Follow up 11/17/20 @ 9:00  Colonoscopy scheduled 08/18/20   Request medications: amlodipine, estradiol, flonase,levothyroxine, crestor be sent to [facility] to help lower cost.  Patient provided number [phone]. Deferred to pcp.    I have personally reviewed and noted the following in the patient's chart:   . Medical and social history . Use of alcohol, tobacco or illicit drugs  . Current medications and supplements . Functional ability and status . Nutritional status . Physical activity . Advanced directives . List of other physicians . Hospitalizations, surgeries, and ER visits in previous 12 months . Vitals . Screenings to include cognitive, depression, and falls . Referrals and appointments  In addition, I have reviewed and discussed with patient certain preventive protocols, quality metrics, and best practice recommendations. A written personalized care plan for preventive services as well as general preventive health recommendations were provided to patient via mychart.     Varney Biles, LPN   01/01/7866      Agree with plan. Mable Paris, NP

## 2020-06-23 NOTE — Telephone Encounter (Signed)
Medications have been [facility]

## 2020-06-23 NOTE — Telephone Encounter (Addendum)
Patient request medications: amlodipine, estradiol, flonase,levothyroxine, crestor be sent to [facility] to help lower cost.  Patient provided number [phone]. Patient asks please send confirmation mychart message once complete.

## 2020-06-23 NOTE — Patient Instructions (Addendum)
  Jill Michael , Thank you for taking time to come for your Medicare Wellness Visit. I appreciate your ongoing commitment to your health goals. Please review the following plan we discussed and let me know if I can assist you in the future.   These are the goals we discussed: Goals      Patient Stated   .  Follow up with Primary Care Provider (pt-stated)      Keep routine maintenance appointments  Eat healthy       This is a list of the screening recommended for you and due dates:  Health Maintenance  Topic Date Due  . Colon Cancer Screening  02/06/2020  . Flu Shot  07/05/2020  . Mammogram  12/30/2021  . Tetanus Vaccine  09/16/2029  . DEXA scan (bone density measurement)  Completed  . COVID-19 Vaccine  Completed  .  Hepatitis C: One time screening is recommended by Center for Disease Control  (CDC) for  adults born from [birth year] through 1964.   Completed  . Pneumonia vaccines  Completed    Immunizations Immunization History  Administered Date(s) Administered  . Influenza, High Dose Seasonal PF 08/19/2017, 08/19/2018, 07/19/2019  . Influenza-Unspecified 08/29/2014, 10/19/2015, 09/07/2016, 08/28/2017  . PFIZER SARS-COV-2 Vaccination 01/28/2020, 02/18/2020  . Pneumococcal Conjugate-13 11/17/2017  . Pneumococcal Polysaccharide-23 11/19/2018  . Td 09/17/2019  . Tdap 07/16/2010  . Zoster 04/10/2015

## 2020-07-15 ENCOUNTER — Ambulatory Visit (INDEPENDENT_AMBULATORY_CARE_PROVIDER_SITE_OTHER): Payer: Medicare Other | Admitting: Dermatology

## 2020-07-15 ENCOUNTER — Other Ambulatory Visit: Payer: Self-pay

## 2020-07-15 DIAGNOSIS — L858 Other specified epidermal thickening: Secondary | ICD-10-CM

## 2020-07-15 DIAGNOSIS — L817 Pigmented purpuric dermatosis: Secondary | ICD-10-CM

## 2020-07-15 DIAGNOSIS — B078 Other viral warts: Secondary | ICD-10-CM

## 2020-07-15 NOTE — Progress Notes (Signed)
   Follow-Up Visit   Subjective  Jill Michael is a 69 y.o. female who presents for the following: lesion (Left upper arm. Changing over past 5 months. Rough texture.). Also has rash on chest, and discoloration on legs.    The following portions of the chart were reviewed this encounter and updated as appropriate:     Review of Systems: No other skin or systemic complaints except as noted in HPI or Assessment and Plan.  Objective  Well appearing patient in no apparent distress; mood and affect are within normal limits.  A focused examination was performed including upper extremities, including the arms, hands, fingers, and fingernails. Relevant physical exam findings are noted in the Assessment and Plan.  Objective  Left Shoulder - Posterior x1: Verrucous papule -- Discussed viral etiology and contagion.   Objective  Left Lower Leg - Anterior, Right Lower Leg - Anterior: Tiny rust colored macules, trace pitting edema at ankles  Objective  chest, abd: Tiny follicular keratotic papules.   Assessment & Plan  Other viral warts Left Shoulder - Posterior x1  Discussed viral etiology and risk of spread.  Discussed multiple treatments may be required to clear warts.  Discussed possible post-treatment dyspigmentation and risk of recurrence.   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  Due to venous stasis HC 2.5% cream BID PRN flares. Continue compression stockings daily.   Keratosis pilaris chest, abd  Recommend using CeraVe SA or AmLactin lotion qd/bid. Okay  to use HC 2.5% cream BID PRN itching.  Return if symptoms worsen or fail to improve.   I, [name], CMA, am acting as scribe for Brendolyn Patty, MD.  Documentation: I have reviewed the above documentation for accuracy and completeness, and I agree with the above.  Brendolyn Patty MD

## 2020-07-15 NOTE — Patient Instructions (Addendum)
Cryotherapy Aftercare  . Wash gently with soap and water everyday.   Marland Kitchen Apply Vaseline and Band-Aid daily until healed.  Prior to procedure, discussed risks of blister formation, small wound, skin dyspigmentation, or rare scar following cryotherapy.   Gentle Skin Care Guide  1. Bathe no more than once a day.  2. Avoid bathing in hot water  3. Use a mild soap like Dove, Vanicream, Cetaphil, CeraVe. Can use Lever 2000 or Cetaphil antibacterial soap  4. Use soap only where you need it. On most days, use it under your arms, between your legs, and on your feet. Let the water rinse other areas unless visibly dirty.  5. When you get out of the bath/shower, use a towel to gently blot your skin dry, don't rub it.  6. While your skin is still a little damp, apply a moisturizing cream such as Vanicream, CeraVe, Cetaphil, Eucerin, Sarna lotion or plain Vaseline Jelly. For hands apply Neutrogena [place] Hand Cream or Excipial Hand Cream.  7. Reapply moisturizer any time you start to itch or feel dry.  8. Sometimes using free and clear laundry detergents can be helpful. Fabric softener sheets should be avoided. Downy Free & Gentle liquid, or any liquid fabric softener that is free of dyes and perfumes, it acceptable to use  9. 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 Continue compression stockings daily.

## 2020-07-24 ENCOUNTER — Ambulatory Visit: Admit: 2020-07-24 | Payer: Medicare Other | Admitting: Unknown Physician Specialty

## 2020-07-24 SURGERY — MYRINGOTOMY WITH TUBE PLACEMENT
Anesthesia: General | Laterality: Right

## 2020-10-06 LAB — HM COLONOSCOPY

## 2020-11-13 ENCOUNTER — Other Ambulatory Visit: Payer: Self-pay

## 2020-11-17 ENCOUNTER — Other Ambulatory Visit: Payer: Self-pay

## 2020-11-17 ENCOUNTER — Encounter: Payer: Self-pay | Admitting: Family

## 2020-11-17 ENCOUNTER — Ambulatory Visit (INDEPENDENT_AMBULATORY_CARE_PROVIDER_SITE_OTHER): Payer: Medicare Other | Admitting: Family

## 2020-11-17 VITALS — BP 122/70 | HR 101 | Temp 98.0°F | Ht 65.5 in | Wt 155.2 lb

## 2020-11-17 DIAGNOSIS — E041 Nontoxic single thyroid nodule: Secondary | ICD-10-CM

## 2020-11-17 DIAGNOSIS — I10 Essential (primary) hypertension: Secondary | ICD-10-CM | POA: Diagnosis not present

## 2020-11-17 DIAGNOSIS — E785 Hyperlipidemia, unspecified: Secondary | ICD-10-CM

## 2020-11-17 DIAGNOSIS — E039 Hypothyroidism, unspecified: Secondary | ICD-10-CM | POA: Diagnosis not present

## 2020-11-17 DIAGNOSIS — Z0000 Encounter for general adult medical examination without abnormal findings: Secondary | ICD-10-CM

## 2020-11-17 DIAGNOSIS — M810 Age-related osteoporosis without current pathological fracture: Secondary | ICD-10-CM

## 2020-11-17 DIAGNOSIS — Z1231 Encounter for screening mammogram for malignant neoplasm of breast: Secondary | ICD-10-CM

## 2020-11-17 DIAGNOSIS — R197 Diarrhea, unspecified: Secondary | ICD-10-CM | POA: Diagnosis not present

## 2020-11-17 DIAGNOSIS — E162 Hypoglycemia, unspecified: Secondary | ICD-10-CM

## 2020-11-17 NOTE — Assessment & Plan Note (Signed)
Stable, continue synthroid 22mcg

## 2020-11-17 NOTE — Assessment & Plan Note (Signed)
Improved on FOD MAP diet. Will follow

## 2020-11-17 NOTE — Patient Instructions (Signed)
Nice to see you!   

## 2020-11-17 NOTE — Progress Notes (Signed)
Subjective:    Patient ID: Jill Michael, female    DOB: [birth year], 69 y.o.   MRN: [id number]  CC: Jill Michael is a 69 y.o. female who presents today for follow up.   HPI: Feels well today. 'best Ive felt in 5 years'.  Diarrhea has improved following FODMAP diet.  Avoids fructose, limits wheat. Bloating has resolved.  Started to take Benefiber-Advanced.  Arthritis symptoms have improved as well which she suspects related to dietary changes.   Continues to walk 4-5 days per week, 30-40 minutes.    HTN- compliant with amlodipine 2.5mg .   Hypothyroidism- compliant with synthroid 63mcg  HLD -compliant with crestor 5mg   Follows [facility] for chronic neck and back pain. Improved. Takes tylenol prn.   Follows with Dr Ladell Pier for [place] with Dr Jill Michael , last consult 4 months ago in regards to HTN ,pericardial effusion.  Colonoscopy UTD with Dr Jill Michael, repeat in 5 years   HISTORY:  Past Medical History:  Diagnosis Date  . Allergy    hay fever  . Arthritis   . Arthritis    right knee, followed by Jill Michael  . Chicken pox   . Low back pain    recurrent  . Osteopenia   . Scoliosis   . Scoliosis   . UTI (urinary tract infection)   . Vaginal atrophy    Past Surgical History:  Procedure Laterality Date  . BREAST BIOPSY Right 2000 and 2005   fibroadenoma right breast x2- both Bx's done under stereo  . NASAL SEPTUM SURGERY    . partial knee replacement Right 2013   Dr. Jefm Michael  . semithyroidectomy of right side  08/2106  . TUBAL LIGATION    . VAGINAL DELIVERY     2   Family History  Problem Relation Age of Onset  . Cancer Mother        breast  . Breast cancer Mother 75  . Cancer Father        lung  . CAD Brother     Allergies: Amitriptyline, Gabapentin, Penicillin v potassium, and Penicillins Current Outpatient Medications on File Prior to Visit  Medication Sig Dispense Refill  . amLODipine (NORVASC) 2.5 MG tablet Take 1  tablet (2.5 mg total) by mouth daily. 90 tablet 1  . Cholecalciferol (VITAMIN D) 2000 UNITS CAPS Take 1 capsule by mouth daily.    . clobetasol ointment (TEMOVATE) 0.05 % Apply to AAs vaginal area starting BID until symptoms improve. Then use 2-3x/wk. Avoid face.    . cycloSPORINE (RESTASIS) 0.05 % ophthalmic emulsion Place 1 drop into both eyes 2 (two) times daily. 180 each 3  . estradiol (ESTRACE) 0.1 MG/GM vaginal cream Use intravaginally 1-3 times per week. 42.5 g 11  . fexofenadine (ALLEGRA) 30 MG tablet Take 30 mg 2 (two) times daily by mouth.    . fluticasone (FLONASE) 50 MCG/ACT nasal spray Place 2 sprays into both nostrils daily. 50 g 1  . levothyroxine (SYNTHROID) 25 MCG tablet Take 1 tablet (25 mcg total) by mouth every morning. 90 tablet 1  . Omega-3 Fatty Acids (FISH OIL) 1000 MG CAPS Take 1 capsule by mouth.    Jill Michael OVER THE COUNTER MEDICATION Take 3 capsules by mouth daily. Instaflex    . OVER THE COUNTER MEDICATION Take 1 capsule by mouth 2 (two) times daily. Tumeric    . rosuvastatin (CRESTOR) 5 MG tablet Take 1 tablet (5 mg total) by mouth at bedtime. (Patient taking  differently: Take 2.5 mg by mouth once a week.) 90 tablet 1   No current facility-administered medications on file prior to visit.    Social History   Tobacco Use  . Smoking status: Never Smoker  . Smokeless tobacco: Never Used  Vaping Use  . Vaping Use: Never used  Substance Use Topics  . Alcohol use: Yes    Comment: occasional  . Drug use: Yes    Review of Systems  Constitutional: Negative for chills and fever.  Respiratory: Negative for cough.   Cardiovascular: Negative for chest pain and palpitations.  Gastrointestinal: Negative for diarrhea (improved), nausea and vomiting.      Objective:    BP 122/70   Pulse (!) 101   Temp 98 F (36.7 C)   Ht 5' 5.5" (1.664 m)   Wt 155 lb 3.2 oz (70.4 kg)   SpO2 99%   BMI 25.43 kg/m  BP Readings from Last 3 Encounters:  11/17/20 122/70  06/23/20  119/77  05/29/20 130/71   Wt Readings from Last 3 Encounters:  11/17/20 155 lb 3.2 oz (70.4 kg)  06/23/20 156 lb (70.8 kg)  05/29/20 156 lb 11.2 oz (71.1 kg)    Physical Exam Vitals reviewed.  Constitutional:      Appearance: She is well-developed and well-nourished.  Eyes:     Conjunctiva/sclera: Conjunctivae normal.  Cardiovascular:     Rate and Rhythm: Normal rate and regular rhythm.     Pulses: Normal pulses.     Heart sounds: Normal heart sounds.  Pulmonary:     Effort: Pulmonary effort is normal.     Breath sounds: Normal breath sounds. No wheezing, rhonchi or rales.  Skin:    General: Skin is warm and dry.  Neurological:     Mental Status: She is alert.  Psychiatric:        Mood and Affect: Mood and affect normal.        Speech: Speech normal.        Behavior: Behavior normal.        Thought Content: Thought content normal.        Assessment & Plan:   Problem List Items Addressed This Visit      Cardiovascular and Mediastinum   HTN (hypertension)    Stable, continue amlodipine 2.5mg         Endocrine   Hypothyroidism    Stable, continue synthroid 54mcg        Musculoskeletal and Integument   Osteoporosis     Other   Diarrhea    Improved on FOD MAP diet. Will follow      HLD (hyperlipidemia)    Stable, continue crestor 5mg        Other Visit Diagnoses    Encounter for screening mammogram for malignant neoplasm of breast    -  Primary   Relevant Orders   MM 3D SCREEN BREAST BILATERAL       I have discontinued [name]. Faul's flurandrenolide, silver sulfADIAZINE, dexamethasone, and predniSONE. I am also having her maintain her Vitamin D, OVER THE COUNTER MEDICATION, OVER THE COUNTER MEDICATION, cycloSPORINE, fexofenadine, Fish Oil, clobetasol ointment, amLODipine, estradiol, fluticasone, levothyroxine, and rosuvastatin.   No orders of the defined types were placed in this encounter.   Return precautions given.   Risks, benefits, and  alternatives of the medications and treatment plan prescribed today were discussed, and patient expressed understanding.   Education regarding symptom management and diagnosis given to patient on AVS.  Continue to follow with Montana Fassnacht,  Yvetta Coder, FNP for routine health maintenance.   Shaneya June Mraz and I agreed with plan.   Mable Paris, FNP

## 2020-11-17 NOTE — Assessment & Plan Note (Addendum)
Stable, continue amlodipine 2.5mg 

## 2020-11-17 NOTE — Assessment & Plan Note (Signed)
Stable, continue crestor 5mg 

## 2020-11-24 ENCOUNTER — Encounter: Payer: Self-pay | Admitting: Dermatology

## 2020-11-24 ENCOUNTER — Ambulatory Visit (INDEPENDENT_AMBULATORY_CARE_PROVIDER_SITE_OTHER): Payer: Medicare Other | Admitting: Dermatology

## 2020-11-24 ENCOUNTER — Other Ambulatory Visit: Payer: Self-pay

## 2020-11-24 DIAGNOSIS — L578 Other skin changes due to chronic exposure to nonionizing radiation: Secondary | ICD-10-CM

## 2020-11-24 DIAGNOSIS — D1800 Hemangioma unspecified site: Secondary | ICD-10-CM

## 2020-11-24 DIAGNOSIS — L719 Rosacea, unspecified: Secondary | ICD-10-CM | POA: Diagnosis not present

## 2020-11-24 DIAGNOSIS — L439 Lichen planus, unspecified: Secondary | ICD-10-CM | POA: Diagnosis not present

## 2020-11-24 DIAGNOSIS — L858 Other specified epidermal thickening: Secondary | ICD-10-CM

## 2020-11-24 DIAGNOSIS — L814 Other melanin hyperpigmentation: Secondary | ICD-10-CM

## 2020-11-24 DIAGNOSIS — Z1283 Encounter for screening for malignant neoplasm of skin: Secondary | ICD-10-CM

## 2020-11-24 DIAGNOSIS — L219 Seborrheic dermatitis, unspecified: Secondary | ICD-10-CM | POA: Diagnosis not present

## 2020-11-24 DIAGNOSIS — L821 Other seborrheic keratosis: Secondary | ICD-10-CM

## 2020-11-24 MED ORDER — CLOBETASOL PROPIONATE 0.05 % EX OINT: TOPICAL_OINTMENT | CUTANEOUS | 1 refills | Status: DC

## 2020-11-24 NOTE — Progress Notes (Signed)
Follow-Up Visit   Subjective  Jill Michael is a 69 y.o. female who presents for the following: Wound Check (Patient has a history of lichen planus of the vaginal area. She is controlled using clobetasol ointment once a week. She stopped using dexamethasone oral solution for the mouth about 10 months ago, and has not had any issues. She has a history of seborrheic dermatitis of the face, controlled by 2.5% hydrocortisone cream. She will call in January for refills. ). She also has a rosacea and has persistent facial redness.   The following portions of the chart were reviewed this encounter and updated as appropriate:       Review of Systems:  No other skin or systemic complaints except as noted in HPI or Assessment and Plan.  Objective  Well appearing patient in no apparent distress; mood and affect are within normal limits.  A full examination was performed including scalp, head, eyes, ears, nose, lips, neck, chest, axillae, abdomen, back, buttocks, bilateral upper extremities, bilateral lower extremities, hands, feet, fingers, toes, fingernails, and toenails. All findings within normal limits unless otherwise noted below.  Objective  Vaginal area: Mild erythema at introitus with white reticulated discoloration. Mouth clear per pt  Objective  Alar crease, eyebrows: Pink scaliness  Objective  face: Erythema on nose and cheeks.   Assessment & Plan   Skin cancer screening performed today.  Actinic Damage - chronic, secondary to cumulative UV radiation exposure/sun exposure over time - diffuse scaly erythematous macules with underlying dyspigmentation - Recommend daily broad spectrum sunscreen SPF 30+ to sun-exposed areas, reapply every 2 hours as needed.  - Call for new or changing lesions.  Lentigines - Scattered tan macules - Discussed due to sun exposure - Benign, observe - Recommend daily broad spectrum sunscreen SPF 30+ to sun-exposed areas, reapply every 2  hours as needed. - Call for any changes  Seborrheic Keratoses - Stuck-on, waxy, tan-brown papules and plaques  - Discussed benign etiology and prognosis. - Observe - Call for any changes  Keratosis Pilaris - Tiny follicular keratotic papules of upper arms and abdomen - Benign. Genetic in nature. No cure. - Observe. - If desired, patient can use an emollient (moisturizer) containing ammonium lactate, urea or salicylic acid once a day to smooth the area  Hemangiomas - Red papules - Discussed benign nature - Observe - Call for any changes   Lichen planus Vaginal area  Continue clobetasol ointment once weekly prn to vaginal area. May increase to bid prn flares OK to spot treat AA in mouth prn. Avoid face, axilla.   Topical steroids (such as triamcinolone, fluocinolone, fluocinonide, mometasone, clobetasol, halobetasol, betamethasone, hydrocortisone) can cause thinning and lightening of the skin if they are used for too long in the same area. Your physician has selected the right strength medicine for your problem and area affected on the body. Please use your medication only as directed by your physician to prevent side effects.    Seborrheic dermatitis Alar crease, eyebrows  Continue 2.5% Hydrocortisone cream qd/bid prn flares. Discussed that regular use can worsen rosacea.  Patient will call for refills in January 2022 due to insurance change.  Rosacea face  Recommend broad spectrum SPF 30 and photoprotection  Discussed Rhofade.  Discussed BBL laser Pt defers Rx at this time  Rosacea is a chronic progressive skin condition usually affecting the face of adults, causing redness and/or acne bumps. It is treatable but not curable. It sometimes affects the eyes (ocular rosacea) as well.  It may respond to topical and/or systemic medication and can flare with stress, sun exposure, alcohol, exercise and some foods.  Daily application of broad spectrum spf 30+ sunscreen to face is  recommended to reduce flares.   Return in about 1 year (around 11/24/2021) for TBSE.   ICherlyn Labella, CMA, am acting as scribe for Willeen Niece, MD .  Documentation: I have reviewed the above documentation for accuracy and completeness, and I agree with the above.  Willeen Niece MD

## 2020-11-24 NOTE — Patient Instructions (Signed)
Recommend daily broad spectrum sunscreen SPF 30+ to sun-exposed areas, reapply every 2 hours as needed. Call for new or changing lesions.  

## 2020-11-25 ENCOUNTER — Encounter: Payer: Self-pay | Admitting: Family

## 2020-11-25 ENCOUNTER — Other Ambulatory Visit: Payer: Self-pay

## 2020-11-25 DIAGNOSIS — N952 Postmenopausal atrophic vaginitis: Secondary | ICD-10-CM

## 2020-11-25 MED ORDER — ESTRADIOL 0.1 MG/GM VA CREA: TOPICAL_CREAM | VAGINAL | 11 refills | Status: DC

## 2020-12-07 ENCOUNTER — Encounter: Payer: Self-pay | Admitting: Family

## 2020-12-07 ENCOUNTER — Other Ambulatory Visit: Payer: Self-pay

## 2020-12-07 DIAGNOSIS — R030 Elevated blood-pressure reading, without diagnosis of hypertension: Secondary | ICD-10-CM

## 2020-12-07 DIAGNOSIS — E041 Nontoxic single thyroid nodule: Secondary | ICD-10-CM

## 2020-12-07 DIAGNOSIS — Z0000 Encounter for general adult medical examination without abnormal findings: Secondary | ICD-10-CM

## 2020-12-07 MED ORDER — LEVOTHYROXINE SODIUM 25 MCG PO TABS: 25.0000 ug | ORAL_TABLET | Freq: Every morning | ORAL | 3 refills | Status: DC

## 2020-12-07 MED ORDER — AMLODIPINE BESYLATE 2.5 MG PO TABS: 2.5000 mg | ORAL_TABLET | Freq: Every day | ORAL | 3 refills | Status: DC

## 2020-12-07 MED ORDER — FLUTICASONE PROPIONATE 50 MCG/ACT NA SUSP: 2.0000 | Freq: Every day | NASAL | 3 refills | Status: DC

## 2020-12-31 ENCOUNTER — Other Ambulatory Visit: Payer: Self-pay

## 2020-12-31 ENCOUNTER — Ambulatory Visit
Admission: RE | Admit: 2020-12-31 | Discharge: 2020-12-31 | Disposition: A | Discharge status: Home / Self Care | Payer: Medicare Other | Source: Ambulatory Visit | Attending: Family | Admitting: Family

## 2020-12-31 DIAGNOSIS — Z1231 Encounter for screening mammogram for malignant neoplasm of breast: Secondary | ICD-10-CM | POA: Insufficient documentation

## 2021-01-13 ENCOUNTER — Other Ambulatory Visit: Payer: Self-pay

## 2021-01-13 ENCOUNTER — Encounter: Payer: Self-pay | Admitting: Family

## 2021-01-13 ENCOUNTER — Other Ambulatory Visit (INDEPENDENT_AMBULATORY_CARE_PROVIDER_SITE_OTHER): Payer: Medicare Other

## 2021-01-13 DIAGNOSIS — M810 Age-related osteoporosis without current pathological fracture: Secondary | ICD-10-CM | POA: Diagnosis not present

## 2021-01-13 DIAGNOSIS — E162 Hypoglycemia, unspecified: Secondary | ICD-10-CM

## 2021-01-13 DIAGNOSIS — E041 Nontoxic single thyroid nodule: Secondary | ICD-10-CM

## 2021-01-13 DIAGNOSIS — I10 Essential (primary) hypertension: Secondary | ICD-10-CM

## 2021-01-13 DIAGNOSIS — E039 Hypothyroidism, unspecified: Secondary | ICD-10-CM

## 2021-01-13 DIAGNOSIS — E785 Hyperlipidemia, unspecified: Secondary | ICD-10-CM

## 2021-01-13 LAB — CBC WITH DIFFERENTIAL/PLATELET
Basophils Absolute: 0.1 10*3/uL (ref 0.0–0.1)
Basophils Relative: 1.2 % (ref 0.0–3.0)
Eosinophils Absolute: 0.1 10*3/uL (ref 0.0–0.7)
Eosinophils Relative: 1.9 % (ref 0.0–5.0)
HCT: 42.4 % (ref 36.0–46.0)
Hemoglobin: 14.6 g/dL (ref 12.0–15.0)
Lymphocytes Relative: 28 % (ref 12.0–46.0)
Lymphs Abs: 1.5 10*3/uL (ref 0.7–4.0)
MCHC: 34.4 g/dL (ref 30.0–36.0)
MCV: 93.5 fl (ref 78.0–100.0)
Monocytes Absolute: 0.6 10*3/uL (ref 0.1–1.0)
Monocytes Relative: 11.1 % (ref 3.0–12.0)
Neutro Abs: 3.1 10*3/uL (ref 1.4–7.7)
Neutrophils Relative %: 57.8 % (ref 43.0–77.0)
Platelets: 216 10*3/uL (ref 150.0–400.0)
RBC: 4.54 Mil/uL (ref 3.87–5.11)
RDW: 12.6 % (ref 11.5–15.5)
WBC: 5.4 10*3/uL (ref 4.0–10.5)

## 2021-01-13 LAB — COMPREHENSIVE METABOLIC PANEL
ALT: 14 U/L (ref 0–35)
AST: 19 U/L (ref 0–37)
Albumin: 4.1 g/dL (ref 3.5–5.2)
Alkaline Phosphatase: 32 U/L — ABNORMAL LOW (ref 39–117)
BUN: 18 mg/dL (ref 6–23)
CO2: 29 mEq/L (ref 19–32)
Calcium: 9.3 mg/dL (ref 8.4–10.5)
Chloride: 102 mEq/L (ref 96–112)
Creatinine, Ser: 0.75 mg/dL (ref 0.40–1.20)
GFR: 81.67 mL/min (ref >60.00)
Glucose, Bld: 89 mg/dL (ref 70–99)
Potassium: 4 mEq/L (ref 3.5–5.1)
Sodium: 137 mEq/L (ref 135–145)
Total Bilirubin: 0.7 mg/dL (ref 0.2–1.2)
Total Protein: 6.6 g/dL (ref 6.0–8.3)

## 2021-01-13 LAB — LIPID PANEL
Cholesterol: 143 mg/dL (ref 0–200)
HDL: 49.7 mg/dL (ref >39.00)
LDL Cholesterol: 82 mg/dL (ref 0–99)
NonHDL: 92.92
Total CHOL/HDL Ratio: 3
Triglycerides: 57 mg/dL (ref 0.0–149.0)
VLDL: 11.4 mg/dL (ref 0.0–40.0)

## 2021-01-13 LAB — HEMOGLOBIN A1C: Hgb A1c MFr Bld: 5.2 % (ref 4.6–6.5)

## 2021-01-13 LAB — TSH: TSH: 4.85 u[IU]/mL — ABNORMAL HIGH (ref 0.35–4.50)

## 2021-01-13 LAB — VITAMIN D 25 HYDROXY (VIT D DEFICIENCY, FRACTURES): VITD: 51.54 ng/mL (ref 30.00–100.00)

## 2021-01-14 ENCOUNTER — Other Ambulatory Visit: Payer: Self-pay | Admitting: Family

## 2021-01-15 ENCOUNTER — Encounter: Payer: Self-pay | Admitting: Family

## 2021-01-15 ENCOUNTER — Other Ambulatory Visit: Payer: Self-pay | Admitting: Family

## 2021-01-15 DIAGNOSIS — E041 Nontoxic single thyroid nodule: Secondary | ICD-10-CM

## 2021-01-15 MED ORDER — LEVOTHYROXINE SODIUM 50 MCG PO TABS
50.0000 ug | ORAL_TABLET | Freq: Every morning | ORAL | 1 refills | Status: DC
Start: 2021-01-15 — End: 2021-02-17

## 2021-02-02 ENCOUNTER — Encounter: Payer: Self-pay | Admitting: Family

## 2021-02-02 ENCOUNTER — Other Ambulatory Visit: Payer: Self-pay

## 2021-02-02 MED ORDER — ROSUVASTATIN CALCIUM 5 MG PO TABS: 5.0000 mg | ORAL_TABLET | Freq: Every day | ORAL | 1 refills | Status: DC

## 2021-02-11 ENCOUNTER — Other Ambulatory Visit: Payer: Self-pay

## 2021-02-11 ENCOUNTER — Encounter: Payer: Self-pay | Admitting: Dermatology

## 2021-02-11 MED ORDER — HYDROCORTISONE 2.5 % EX CREA: TOPICAL_CREAM | CUTANEOUS | 1 refills | Status: AC

## 2021-02-11 NOTE — Progress Notes (Signed)
Hydrocortisone RF per patient request.

## 2021-02-16 ENCOUNTER — Encounter: Payer: Medicare Other | Admitting: Family

## 2021-02-17 ENCOUNTER — Encounter: Payer: Self-pay | Admitting: Family

## 2021-02-17 ENCOUNTER — Other Ambulatory Visit: Payer: Self-pay

## 2021-02-17 DIAGNOSIS — E041 Nontoxic single thyroid nodule: Secondary | ICD-10-CM

## 2021-02-17 MED ORDER — LEVOTHYROXINE SODIUM 50 MCG PO TABS: 50.0000 ug | ORAL_TABLET | Freq: Every morning | ORAL | 1 refills | Status: DC

## 2021-02-22 ENCOUNTER — Other Ambulatory Visit: Payer: Self-pay

## 2021-02-26 ENCOUNTER — Other Ambulatory Visit (INDEPENDENT_AMBULATORY_CARE_PROVIDER_SITE_OTHER): Payer: Medicare Other

## 2021-02-26 ENCOUNTER — Other Ambulatory Visit: Payer: Self-pay

## 2021-02-26 DIAGNOSIS — E041 Nontoxic single thyroid nodule: Secondary | ICD-10-CM | POA: Diagnosis not present

## 2021-02-26 LAB — COMPREHENSIVE METABOLIC PANEL
ALT: 13 U/L (ref 0–35)
AST: 17 U/L (ref 0–37)
Albumin: 4.2 g/dL (ref 3.5–5.2)
Alkaline Phosphatase: 30 U/L — ABNORMAL LOW (ref 39–117)
BUN: 19 mg/dL (ref 6–23)
CO2: 28 mEq/L (ref 19–32)
Calcium: 9 mg/dL (ref 8.4–10.5)
Chloride: 105 mEq/L (ref 96–112)
Creatinine, Ser: 0.72 mg/dL (ref 0.40–1.20)
GFR: 85.7 mL/min (ref >60.00)
Glucose, Bld: 111 mg/dL — ABNORMAL HIGH (ref 70–99)
Potassium: 4.1 mEq/L (ref 3.5–5.1)
Sodium: 141 mEq/L (ref 135–145)
Total Bilirubin: 0.5 mg/dL (ref 0.2–1.2)
Total Protein: 6.4 g/dL (ref 6.0–8.3)

## 2021-02-26 LAB — TSH: TSH: 2.07 u[IU]/mL (ref 0.35–4.50)

## 2021-02-26 NOTE — Addendum Note (Signed)
Addended by: Leeanne Rio on: 02/26/2021 08:25 AM   Modules accepted: Orders

## 2021-04-20 ENCOUNTER — Ambulatory Visit (INDEPENDENT_AMBULATORY_CARE_PROVIDER_SITE_OTHER): Payer: Medicare Other | Admitting: Obstetrics and Gynecology

## 2021-04-20 ENCOUNTER — Other Ambulatory Visit: Payer: Self-pay

## 2021-04-20 ENCOUNTER — Encounter: Payer: Self-pay | Admitting: Obstetrics and Gynecology

## 2021-04-20 VITALS — BP 138/80 | Ht 66.0 in | Wt 157.0 lb

## 2021-04-20 DIAGNOSIS — N9089 Other specified noninflammatory disorders of vulva and perineum: Secondary | ICD-10-CM | POA: Diagnosis not present

## 2021-04-20 NOTE — Progress Notes (Signed)
Obstetrics & Gynecology Office Visit   Chief Complaint  Patient presents with  . Bartholin's Cyst    History of Present Illness: 70 y.o. menopausal Female who presents with a history of Bartholin's Gland cyst and abscess in 2020. This ruptured and was treated with antibiotics and resolved.  Last week she noted some soreness on her right side.  Yesterday, she looked in the mirror and saw a pink-tip sized bump that looked like a white-head.    She has lichen planus for which she takes clobetasol from her dermatologist.    She used clobetasol last night and the bump has gone down.  She can still feel a lump in that area.  The area has a little bit of burning.  As of Friday, she could feel a lump in that area.     Past Medical History:  Diagnosis Date  . Actinic keratosis   . Allergy    hay fever  . Arthritis   . Arthritis    right knee, followed by Jefm Bryant  . Chicken pox   . Low back pain    recurrent  . Osteopenia   . Scoliosis   . Scoliosis   . UTI (urinary tract infection)   . Vaginal atrophy     Past Surgical History:  Procedure Laterality Date  . BREAST BIOPSY Right 2000 and 2005   fibroadenoma right breast x2- both Bx's done under stereo  . NASAL SEPTUM SURGERY    . partial knee replacement Right 2013   Dr. Jefm Bryant  . semithyroidectomy of right side  08/2106  . TUBAL LIGATION    . VAGINAL DELIVERY     2    Gynecologic History: No LMP recorded. Patient is postmenopausal.  Obstetric History: No obstetric history on file.  Family History  Problem Relation Age of Onset  . Cancer Mother        breast  . Breast cancer Mother 41  . Cancer Father        lung  . CAD Brother     Social History   Socioeconomic History  . Marital status: Married    Spouse name: Not on file  . Number of children: Not on file  . Years of education: Not on file  . Highest education level: Not on file  Occupational History  . Not on file  Tobacco Use  . Smoking status:  Never Smoker  . Smokeless tobacco: Never Used  Vaping Use  . Vaping Use: Never used  Substance and Sexual Activity  . Alcohol use: Yes    Comment: occasional  . Drug use: Yes  . Sexual activity: Not Currently    Birth control/protection: Post-menopausal  Other Topics Concern  . Not on file  Social History Narrative   Lives in [place]. Moved from [place] in [employer], [employer]. Plans to retire 09/25/16. She won her appeal for LT disability (12/2016).       Diet - regular, healthy; swims 4-5x per week   Exercise - none, walked previously   Social Determinants of Health   Financial Resource Strain: Not on file  Food Insecurity: Not on file  Transportation Needs: Not on file  Physical Activity: Not on file  Stress: Not on file  Social Connections: Not on file  Intimate Partner Violence: Not on file    Allergies  Allergen Reactions  . Amitriptyline     Fatigue and memory lapses  . Gabapentin     vertigo  .  Penicillin V Potassium Rash  . Penicillins Hives and Rash    Prior to Admission medications   Medication Sig Start Date End Date Taking? Authorizing Provider  amLODipine (NORVASC) 2.5 MG tablet Take 1 tablet (2.5 mg total) by mouth daily. 12/07/20  Yes Burnard Hawthorne, FNP  Cholecalciferol (VITAMIN D) 2000 UNITS CAPS Take 1 capsule by mouth daily.   Yes [provider]  clobetasol ointment (TEMOVATE) 0.05 % Apply to AAs vaginal area starting BID until symptoms improve. Then use 2-3x/wk. Avoid face. 11/24/20  Yes Brendolyn Patty, MD  cycloSPORINE (RESTASIS) 0.05 % ophthalmic emulsion Place 1 drop into both eyes 2 (two) times daily. 12/19/16  Yes Burnard Hawthorne, FNP  estradiol (ESTRACE) 0.1 MG/GM vaginal cream Use intravaginally 1-3 times per week. 11/25/20  Yes Arnett, Yvetta Coder, FNP  fexofenadine (ALLEGRA) 30 MG tablet Take 30 mg 2 (two) times daily by mouth.   Yes [provider]  fluticasone (FLONASE) 50 MCG/ACT nasal spray Place  2 sprays into both nostrils daily. 12/07/20  Yes Burnard Hawthorne, FNP  hydrocortisone 2.5 % cream Apply topically as directed. Apply once to twice daily as needed for flares 02/11/21  Yes Brendolyn Patty, MD  levothyroxine (SYNTHROID) 50 MCG tablet Take 1 tablet (50 mcg total) by mouth every morning. 02/17/21  Yes Arnett, Yvetta Coder, FNP  Omega-3 Fatty Acids (FISH OIL) 1000 MG CAPS Take 1 capsule by mouth.   Yes [provider]  OVER THE COUNTER MEDICATION Take 3 capsules by mouth daily. Instaflex   Yes [provider]  OVER THE COUNTER MEDICATION Take 1 capsule by mouth 2 (two) times daily. Tumeric   Yes [provider]  rosuvastatin (CRESTOR) 5 MG tablet Take 1 tablet (5 mg total) by mouth at bedtime. 02/02/21  Yes Burnard Hawthorne, FNP    Review of Systems  Constitutional: Negative.   HENT: Negative.   Eyes: Negative.   Respiratory: Negative.   Cardiovascular: Negative.   Gastrointestinal: Negative.   Genitourinary: Negative.   Musculoskeletal: Negative.   Skin: Negative.   Neurological: Negative.   Psychiatric/Behavioral: Negative.      Physical Exam BP 138/80   Ht 5\' 6"  (1.676 m)   Wt 157 lb (71.2 kg)   BMI 25.34 kg/m  No LMP recorded. Patient is postmenopausal. Physical Exam Constitutional:      General: She is not in acute distress.    Appearance: Normal appearance.  Genitourinary:     Bladder and urethral meatus normal.     No lesions in the vagina.     Right Labia: No rash, tenderness, lesions, skin changes or Bartholin's cyst.    Left Labia: No tenderness, lesions, skin changes, Bartholin's cyst or rash.       No inguinal adenopathy present in the right or left side.    Pelvic Tanner Score: 5/5.    No vaginal discharge, erythema, bleeding or ulceration.     No vaginal prolapse present.     Right Adnexa: not tender, not full and no mass present.    Left Adnexa: not tender, not full and no mass present.    No cervical motion tenderness,  discharge, friability, lesion or polyp.     Uterus is not enlarged, fixed or tender.     Uterus is anteverted.  HENT:     Head: Normocephalic and atraumatic.  Eyes:     General: No scleral icterus.    Conjunctiva/sclera: Conjunctivae normal.  Lymphadenopathy:     Lower Body: No  right inguinal adenopathy. No left inguinal adenopathy.  Neurological:     General: No focal deficit present.     Mental Status: She is alert and oriented to person, place, and time.     Cranial Nerves: No cranial nerve deficit.  Psychiatric:        Mood and Affect: Mood normal.        Behavior: Behavior normal.        Judgment: Judgment normal.     Female chaperone present for pelvic and breast  portions of the physical exam  Assessment: 70 y.o. No obstetric history on file. female here for  1. Labial lesion      Plan: Problem List Items Addressed This Visit   None   Visit Diagnoses    Labial lesion    -  Primary     Normal exam. Reassurance provided. Labial lesion of uncertain significance.  This seems to have resolved with a single dose of clobetasol. Discussed used of steroids for lesions of unknown significance. Steroids may decrease amount of inflammation associated with infection, neoplasm, etc., and mask underlying pathology. Return to clinic, if lesion returns. I would try to get her in the same day, if possible, to assess.   A total of 23 minutes were spent face-to-face with the patient as well as preparation, review, communication, and documentation during this encounter.    Prentice Docker, MD 04/20/2021 4:47 PM

## 2021-06-24 ENCOUNTER — Ambulatory Visit: Payer: Medicare Other

## 2021-06-24 ENCOUNTER — Other Ambulatory Visit: Payer: Self-pay | Admitting: Family

## 2021-06-24 DIAGNOSIS — E041 Nontoxic single thyroid nodule: Secondary | ICD-10-CM

## 2021-06-28 ENCOUNTER — Ambulatory Visit: Payer: Medicare Other

## 2021-08-11 ENCOUNTER — Other Ambulatory Visit: Payer: Self-pay

## 2021-08-11 ENCOUNTER — Encounter: Payer: Self-pay | Admitting: Emergency Medicine

## 2021-08-11 ENCOUNTER — Ambulatory Visit
Admission: EM | Admit: 2021-08-11 | Discharge: 2021-08-11 | Disposition: A | Discharge status: Home / Self Care | Payer: Medicare Other | Attending: Emergency Medicine | Admitting: Emergency Medicine

## 2021-08-11 DIAGNOSIS — H6981 Other specified disorders of Eustachian tube, right ear: Secondary | ICD-10-CM | POA: Diagnosis not present

## 2021-08-11 DIAGNOSIS — M26621 Arthralgia of right temporomandibular joint: Secondary | ICD-10-CM

## 2021-08-11 MED ORDER — PREDNISONE 20 MG PO TABS: 40.0000 mg | ORAL_TABLET | Freq: Every day | ORAL | 0 refills | Status: AC

## 2021-08-11 MED ORDER — NAPROXEN 500 MG PO TABS: 500.0000 mg | ORAL_TABLET | Freq: Two times a day (BID) | ORAL | 0 refills | Status: AC

## 2021-08-11 NOTE — ED Triage Notes (Signed)
Pt here with right ear pain and fullness and dizziness at times. States this happens every year around this time. Has had 2 sets of eustachian tubes as adult, but her ENT is no longer in practice.

## 2021-08-11 NOTE — Discharge Instructions (Addendum)
Continue Sudafed, just watch her blood pressure while taking it, saline nasal irrigation with a Milta Deiters Med rinse and distilled water as often as you want in addition to the Flonase.  The prednisone will help with inflammation in your jaw and your eustachian tube.  Soft diet.  Naprosyn twice a day as needed for pain, swelling.  Return here, see your doctor, or follow-up with your ENT if not getting any better, we can consider antibiotics for sinus infection at that time.

## 2021-08-11 NOTE — ED Provider Notes (Signed)
HPI  SUBJECTIVE:  Jill Michael is a 70 y.o. female who presents with 2 days of right ear pressure, sensation of feeling off balance, occasional nausea, nasal congestion, postnasal drip, clear rhinorrhea, cough, frontal headache also located around her right eye.  No change in hearing, otorrhea, vertigo, vomiting, fevers.  No sinus pain or pressure, upper dental pain, facial swelling.  She has been taking Sudafed twice daily with improvement in her symptoms.  She has also tried Tylenol.  She takes Environmental manager daily for her allergies.  No aggravating factors.  No antibiotics in the past month.  No antipyretic in past 6 hours.  She states this happens around this time every year, and has been found to have TMJ arthralgia, eustachian tube dysfunction and serous otitis.  States that she is usually treated with prednisone and antibiotics, and that cefaclor works well.  She states that she clenches her jaw at night, but has a bite guard.  She has a past medical history of recurrent right otitis media status post 2 sets of tympanoplasty tubes as an adult, allergies, hypertension, TMJ arthralgia.  No history of diabetes, chronic kidney disease, GI bleed.  [id number], Yvetta Coder, FNP    Past Medical History:  Diagnosis Date   Actinic keratosis    Allergy    hay fever   Arthritis    Arthritis    right knee, followed by Jefm Bryant   Chicken pox    Low back pain    recurrent   Osteopenia    Scoliosis    Scoliosis    UTI (urinary tract infection)    Vaginal atrophy     Past Surgical History:  Procedure Laterality Date   BREAST BIOPSY Right 2000 and 2005   fibroadenoma right breast x2- both Bx's done under stereo   NASAL SEPTUM SURGERY     partial knee replacement Right 2013   Dr. Jefm Bryant   semithyroidectomy of right side  08/2106   TUBAL LIGATION     VAGINAL DELIVERY     2    Family History  Problem Relation Age of Onset   Cancer Mother        breast   Breast cancer Mother 28    Cancer Father        lung   CAD Brother     Social History   Tobacco Use   Smoking status: Never   Smokeless tobacco: Never  Vaping Use   Vaping Use: Never used  Substance Use Topics   Alcohol use: Yes    Comment: occasional   Drug use: Yes    No current facility-administered medications for this encounter.  Current Outpatient Medications:    naproxen (NAPROSYN) 500 MG tablet, Take 1 tablet (500 mg total) by mouth 2 (two) times daily for 5 days., Disp: 10 tablet, Rfl: 0   predniSONE (DELTASONE) 20 MG tablet, Take 2 tablets (40 mg total) by mouth daily with breakfast for 5 days., Disp: 10 tablet, Rfl: 0   amLODipine (NORVASC) 2.5 MG tablet, Take 1 tablet (2.5 mg total) by mouth daily., Disp: 90 tablet, Rfl: 3   Cholecalciferol (VITAMIN D) 2000 UNITS CAPS, Take 1 capsule by mouth daily., Disp: , Rfl:    clobetasol ointment (TEMOVATE) 0.05 %, Apply to AAs vaginal area starting BID until symptoms improve. Then use 2-3x/wk. Avoid face., Disp: 45 g, Rfl: 1   cycloSPORINE (RESTASIS) 0.05 % ophthalmic emulsion, Place 1 drop into both eyes 2 (two) times daily., Disp: 180 each,  Rfl: 3   estradiol (ESTRACE) 0.1 MG/GM vaginal cream, Use intravaginally 1-3 times per week., Disp: 42.5 g, Rfl: 11   fexofenadine (ALLEGRA) 30 MG tablet, Take 30 mg 2 (two) times daily by mouth., Disp: , Rfl:    fluticasone (FLONASE) 50 MCG/ACT nasal spray, Place 2 sprays into both nostrils daily., Disp: 50 g, Rfl: 3   hydrocortisone 2.5 % cream, Apply topically as directed. Apply once to twice daily as needed for flares, Disp: 28 g, Rfl: 1   levothyroxine (SYNTHROID) 50 MCG tablet, TAKE 1 TABLET EVERY MORNING, Disp: 90 tablet, Rfl: 0   Omega-3 Fatty Acids (FISH OIL) 1000 MG CAPS, Take 1 capsule by mouth., Disp: , Rfl:    Michael THE COUNTER MEDICATION, Take 3 capsules by mouth daily. Instaflex, Disp: , Rfl:    Michael THE COUNTER MEDICATION, Take 1 capsule by mouth 2 (two) times daily. Tumeric, Disp: , Rfl:     rosuvastatin (CRESTOR) 5 MG tablet, Take 1 tablet (5 mg total) by mouth at bedtime., Disp: 90 tablet, Rfl: 1  Allergies  Allergen Reactions   Amitriptyline     Fatigue and memory lapses   Gabapentin     vertigo   Penicillin V Potassium Rash   Penicillins Hives and Rash     ROS  As noted in HPI.   Physical Exam  BP 128/78   Pulse 64   Temp 98 F (36.7 C)   Resp 18   SpO2 98%   Constitutional: Well developed, well nourished, no acute distress Eyes:  EOMI, conjunctiva normal bilaterally HENT: Normocephalic, atraumatic,mucus membranes moist.  Right external ear normal.  No pain with traction on pinna, palpation of tragus, palpation of mastoid.  EAC normal.  Positive scar tissue right TM.  TM intact.  No air-fluid levels, purulence, redness, bulging.  Right TMJ tender.  No crepitus.  Left TM normal.  Positive nasal congestion.  Right maxillary sinus tenderness. Respiratory: Normal inspiratory effort Cardiovascular: Normal rate GI: nondistended skin: No rash, skin intact Musculoskeletal: no deformities Neurologic: Alert & oriented x 3, no focal neuro deficits Psychiatric: Speech and behavior appropriate   ED Course   Medications - No data to display  No orders of the defined types were placed in this encounter.   No results found for this or any previous visit (from the past 24 hour(s)). No results found.  ED Clinical Impression  1. Arthralgia of right temporomandibular joint   2. Dysfunction of right eustachian tube      ED Assessment/Plan  Suspect the otalgia is from TMJ arthralgia.  She clenches her teeth at night.  She declined a prescription of Flexeril.   will try prednisone, soft diet, Naprosyn twice daily as needed for 5 days. The dizziness may be from eustachian tube dysfunction.  There is no evidence of otitis media.  Advised saline nasal irrigation in addition to Flonase, Sudafed.  We discussed antibiotics today for possible sinus infection, but she has  no clear indications for antibiotics today.  We will see how she does, she will return here or see her doctor if not getting any better after finishing the steroids and we can consider starting her on something.  She has no problems with cephalosporins.  Discussed MDM, treatment plan, and plan for follow-up with patient. . patient agrees with plan.   Meds ordered this encounter  Medications   predniSONE (DELTASONE) 20 MG tablet    Sig: Take 2 tablets (40 mg total) by mouth daily with breakfast for  5 days.    Dispense:  10 tablet    Refill:  0   naproxen (NAPROSYN) 500 MG tablet    Sig: Take 1 tablet (500 mg total) by mouth 2 (two) times daily for 5 days.    Dispense:  10 tablet    Refill:  0      *This clinic note was created using Lobbyist. Therefore, there may be occasional mistakes despite careful proofreading.  ?    Melynda Ripple, MD 08/11/21 1408

## 2021-08-12 ENCOUNTER — Ambulatory Visit: Payer: Medicare Other | Admitting: Internal Medicine

## 2021-08-18 ENCOUNTER — Ambulatory Visit (INDEPENDENT_AMBULATORY_CARE_PROVIDER_SITE_OTHER): Payer: Medicare Other

## 2021-08-18 VITALS — BP 111/74 | HR 85 | Ht 66.0 in | Wt 156.0 lb

## 2021-08-18 DIAGNOSIS — Z0000 Encounter for general adult medical examination without abnormal findings: Secondary | ICD-10-CM | POA: Diagnosis not present

## 2021-08-18 NOTE — Progress Notes (Signed)
Subjective:   Summers June Autrey is a 70 y.o. female who presents for Medicare Annual (Subsequent) preventive examination.  Review of Systems    No ROS.  Medicare Wellness Virtual Visit.  Visual/audio telehealth visit, UTA vital signs.   See social history for additional risk factors.   Cardiac Risk Factors include: advanced age (>67mn, >>62women)     Objective:    Today's Vitals   08/18/21 1034  BP: 111/74  Pulse: 85  Weight: 156 lb (70.8 kg)  Height: '5\' 6"'$  (1.676 m)   Body mass index is 25.18 kg/m.  Advanced Directives 08/18/2021 06/23/2020 09/09/2019 09/09/2019 06/21/2019 12/16/2015 08/17/2015  Does Patient Have a Medical Advance Directive? No No No No No No No  Would patient like information on creating a medical advance directive? No - Patient declined No - Patient declined No - Patient declined No - Patient declined No - Patient declined No - patient declined information No - patient declined information    Current Medications (verified) Outpatient Encounter Medications as of 08/18/2021  Medication Sig   amLODipine (NORVASC) 2.5 MG tablet Take 1 tablet (2.5 mg total) by mouth daily.   Cholecalciferol (VITAMIN D) 2000 UNITS CAPS Take 1 capsule by mouth daily.   clobetasol ointment (TEMOVATE) 0.05 % Apply to AAs vaginal area starting BID until symptoms improve. Then use 2-3x/wk. Avoid face.   cycloSPORINE (RESTASIS) 0.05 % ophthalmic emulsion Place 1 drop into both eyes 2 (two) times daily.   estradiol (ESTRACE) 0.1 MG/GM vaginal cream Use intravaginally 1-3 times per week.   fexofenadine (ALLEGRA) 30 MG tablet Take 30 mg 2 (two) times daily by mouth.   fluticasone (FLONASE) 50 MCG/ACT nasal spray Place 2 sprays into both nostrils daily.   hydrocortisone 2.5 % cream Apply topically as directed. Apply once to twice daily as needed for flares   levothyroxine (SYNTHROID) 50 MCG tablet TAKE 1 TABLET EVERY MORNING   Omega-3 Fatty Acids (FISH OIL) 1000 MG CAPS Take 1 capsule by  mouth.   OVER THE COUNTER MEDICATION Take 3 capsules by mouth daily. Instaflex   OVER THE COUNTER MEDICATION Take 1 capsule by mouth 2 (two) times daily. Tumeric   rosuvastatin (CRESTOR) 5 MG tablet Take 1 tablet (5 mg total) by mouth at bedtime.   No facility-administered encounter medications on file as of 08/18/2021.    Allergies (verified) Amitriptyline, Gabapentin, Penicillin v potassium, and Penicillins   History: Past Medical History:  Diagnosis Date   Actinic keratosis    Allergy    hay fever   Arthritis    Arthritis    right knee, followed by KJefm Bryant  Chicken pox    Low back pain    recurrent   Osteopenia    Scoliosis    Scoliosis    UTI (urinary tract infection)    Vaginal atrophy    Past Surgical History:  Procedure Laterality Date   BREAST BIOPSY Right 2000 and 2005   fibroadenoma right breast x2- both Bx's done under stereo   NASAL SEPTUM SURGERY     partial knee replacement Right 2013   Dr. KJefm Bryant  semithyroidectomy of right side  08/2106   TUBAL LIGATION     VAGINAL DELIVERY     2   Family History  Problem Relation Age of Onset   Cancer Mother        breast   Breast cancer Mother 733  Cancer Father        lung   CAD  Brother    Social History   Socioeconomic History   Marital status: Married    Spouse name: Not on file   Number of children: Not on file   Years of education: Not on file   Highest education level: Not on file  Occupational History   Not on file  Tobacco Use   Smoking status: Never   Smokeless tobacco: Never  Vaping Use   Vaping Use: Never used  Substance and Sexual Activity   Alcohol use: Yes    Comment: occasional   Drug use: Yes   Sexual activity: Not Currently    Birth control/protection: Post-menopausal  Other Topics Concern   Not on file  Social History Narrative   Lives in [place]. 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 She won her appeal for LT  disability (12/2016).       Diet - regular, healthy; swims 4-5x per week   Exercise - none, walked previously   Social Determinants of Radio broadcast assistant Strain: Low Risk    Difficulty of Paying Living Expenses: Not hard at all  Food Insecurity: No Food Insecurity   Worried About Charity fundraiser in the Last Year: Never true   Ran Out of Food in the Last Year: Never true  Transportation Needs: No Transportation Needs   Lack of Transportation (Medical): No   Lack of Transportation (Non-Medical): No  Physical Activity: Insufficiently Active   Days of Exercise per Week: 3 days   Minutes of Exercise per Session: 40 min  Stress: No Stress Concern Present   Feeling of Stress : Not at all  Social Connections: Unknown   Frequency of Communication with Friends and Family: More than three times a week   Frequency of Social Gatherings with Friends and Family: More than three times a week   Attends Religious Services: Not on Electrical engineer or Organizations: Not on file   Attends Archivist Meetings: Not on file   Marital Status: Married    Tobacco Counseling Counseling given: Not Answered   Clinical Intake:  Pre-visit preparation completed: Yes        Diabetes: No  How often do you need to have someone help you when you read instructions, pamphlets, or other written materials from your doctor or pharmacy?: 1 - Never    Interpreter Needed?: No      Activities of Daily Living In your present state of health, do you have any difficulty performing the following activities: 08/18/2021  Hearing? N  Vision? N  Difficulty concentrating or making decisions? N  Walking or climbing stairs? N  Dressing or bathing? N  Doing errands, shopping? N  Preparing Food and eating ? N  Using the Toilet? N  In the past six months, have you accidently leaked urine? N  Do you have problems with loss of bowel control? N  Managing your Medications? N   Managing your Finances? N  Housekeeping or managing your Housekeeping? N  Some recent data might be hidden    Patient Care Team: Burnard Hawthorne, FNP as PCP - General (Family Medicine)  Indicate any recent Medical Services you may have received from other than [facility] providers in the past year (date may be approximate).     Assessment:   This is a routine wellness examination for Elinore.  I connected with Trinetta today by telephone and verified that I  am speaking with the correct person using two identifiers. Location patient: home Location provider: work Persons participating in the virtual visit: patient, Marine scientist.    I discussed the limitations, risks, security and privacy concerns of performing an evaluation and management service by telephone and the availability of in person appointments. The patient expressed understanding and verbally consented to this telephonic visit.    Interactive audio and video telecommunications were attempted between this provider and patient, however failed, due to patient having technical difficulties OR patient did not have access to video capability.  We continued and completed visit with audio only.  Some vital signs may be absent or patient reported.   Hearing/Vision screen Hearing Screening - Comments:: Patient is able to hear conversational tones without difficulty. No issues reported. Vision Screening - Comments:: Followed by [facility], Dr. George Ina.  Wears corrective lenses  They have seen their ophthalmologist in the last 12 months.   Dietary issues and exercise activities discussed: Current Exercise Habits: Home exercise routine;Structured exercise class, Type of exercise: walking;strength training/weights;stretching, Time (Minutes): > 60, Frequency (Times/Week): 7, Weekly Exercise (Minutes/Week): 0, Intensity: Moderate Healthy diet Good fluid/water intake   Goals Addressed               This Visit's Progress     Patient  Stated     Follow up with Primary Care Provider (pt-stated)        As needed       Other     Maintain walking 40 minutes daily         Depression Screen PHQ 2/9 Scores 08/18/2021 11/17/2020 06/23/2020 08/22/2019 06/21/2019 04/17/2017 01/10/2017  PHQ - 2 Score 0 1 0 0 0 0 0  PHQ- 9 Score - 2 - 0 - - -    Fall Risk Fall Risk  08/18/2021 06/23/2020 05/29/2020 11/25/2019 06/21/2019  Falls in the past year? 0 0 0 0 0  Number falls in past yr: 0 0 - - -  Comment - - - - -  Injury with Fall? 0 0 - - -  Follow up - Falls evaluation completed Falls evaluation completed Falls evaluation completed -    FALL RISK PREVENTION PERTAINING TO THE HOME: Adequate lighting in your home to reduce risk of falls? Yes   ASSISTIVE DEVICES UTILIZED TO PREVENT FALLS: Life alert? No  Use of a cane, walker or w/c? No   TIMED UP AND GO: Was the test performed? No .   Cognitive Function:  Patient is alert and oriented x3.  Denies difficulty focusing, making decisions, memory loss.  Enjoys crossword puzzles, socializing, reading the newspaper/articles for brain health engagement.  MMSE/6CIT deferred. Normal by direct communication/observation.    6CIT Screen 06/21/2019  What Year? 0 points  What month? 0 points  What time? 0 points  Count back from 20 0 points  Months in reverse 0 points  Repeat phrase 0 points  Total Score 0   Immunizations Immunization History  Administered Date(s) Administered   Influenza, High Dose Seasonal PF 08/19/2017, 08/19/2018, 07/19/2019   Influenza-Unspecified 08/29/2014, 10/19/2015, 08/28/2017, 09/04/2020   PFIZER(Purple Top)SARS-COV-2 Vaccination 01/28/2020, 02/18/2020, 09/17/2020   Pneumococcal Conjugate-13 11/17/2017   Pneumococcal Polysaccharide-23 11/19/2018   Td 09/17/2019   Tdap 07/16/2010   Zoster, Live 04/10/2015   Shingrix vaccine- Education has been provided regarding the importance of this vaccine. Advised may receive this vaccine at local pharmacy or  Health Dept. Aware to provide a copy of the vaccination record if obtained from local  pharmacy or Health Dept. Verbalized acceptance and understanding. Deferred.   Health Maintenance Health Maintenance  Topic Date Due   COVID-19 Vaccine (4 - Booster for [place] series) 09/03/2021 (Originally 12/10/2020)   INFLUENZA VACCINE  10/05/2021 (Originally 07/05/2021)   Zoster Vaccines- Shingrix (1 of 2) 11/17/2021 (Originally 05/23/1970)   MAMMOGRAM  12/31/2022   COLONOSCOPY (Pts 45-1yr Insurance coverage will need to be confirmed)  10/06/2025   TETANUS/TDAP  09/16/2029   DEXA SCAN  Completed   Hepatitis C Screening  Completed   PNA vac Low Risk Adult  Completed   HPV VACCINES  Aged Out   Bone density- followed by Dr. OHonor Junes Scheduled 10/12/21.   Influenza vaccine- senior dose scheduled next Monday 08/23/21.   Lung Cancer Screening: (Low Dose CT Chest recommended if Age 69-80years, 30 pack-year currently smoking OR have quit w/in 15years.) does not qualify.   Vision Screening: Recommended annual ophthalmology exams for early detection of glaucoma and other disorders of the eye. Is the patient up to date with their annual eye exam?  Yes  Dental Screening: Recommended annual dental exams for proper oral hygiene  Community Resource Referral / Chronic Care Management: CRR required this visit?  No   CCM required this visit?  No      Plan:   Keep all routine maintenance appointments.   I have personally reviewed and noted the following in the patient's chart:   Medical and social history Use of alcohol, tobacco or illicit drugs  Current medications and supplements including opioid prescriptions. Not taking opioid.  Functional ability and status Nutritional status Physical activity Advanced directives List of other physicians Hospitalizations, surgeries, and ER visits in previous 12 months Vitals Screenings to include cognitive, depression, and falls Referrals and appointments  In  addition, I have reviewed and discussed with patient certain preventive protocols, quality metrics, and best practice recommendations. A written personalized care plan for preventive services as well as general preventive health recommendations were provided to patient via mychart.     OVarney Biles LPN   [id number]

## 2021-08-18 NOTE — Patient Instructions (Addendum)
[name] , Thank you for taking time to come for your Medicare Wellness Visit. I appreciate your ongoing commitment to your health goals. Please review the following plan we discussed and let me know if I can assist you in the future.   These are the goals we discussed:  Goals       Patient Stated     Follow up with Primary Care Provider (pt-stated)      As needed       Other     Maintain walking 40 minutes daily        This is a list of the screening recommended for you and due dates:  Health Maintenance  Topic Date Due   COVID-19 Vaccine (4 - Booster for Pfizer series) 09/03/2021*   Flu Shot  10/05/2021*   Zoster (Shingles) Vaccine (1 of 2) 11/17/2021*   Mammogram  12/31/2022   Colon Cancer Screening  10/06/2025   Tetanus Vaccine  09/16/2029   DEXA scan (bone density measurement)  Completed   Hepatitis C Screening: USPSTF Recommendation to screen - Ages 22-79 yo.  Completed   Pneumonia vaccines  Completed   HPV Vaccine  Aged Out  *Topic was postponed. The date shown is not the original due date.    Advanced directives: not yet completed  Follow up in one year for your annual wellness visit    Preventive Care 65 Years and Older, Female Preventive care refers to lifestyle choices and visits with your health care provider that can promote health and wellness. What does preventive care include? A yearly physical exam. This is also called an annual well check. Dental exams once or twice a year. Routine eye exams. Ask your health care provider how often you should have your eyes checked. Personal lifestyle choices, including: Daily care of your teeth and gums. Regular physical activity. Eating a healthy diet. Avoiding tobacco and drug use. Limiting alcohol use. Practicing safe sex. Taking low-dose aspirin every day. Taking vitamin and mineral supplements as recommended by your health care provider. What happens during an annual well check? The services and  screenings done by your health care provider during your annual well check will depend on your age, overall health, lifestyle risk factors, and family history of disease. Counseling  Your health care provider may ask you questions about your: Alcohol use. Tobacco use. Drug use. Emotional well-being. Home and relationship well-being. Sexual activity. Eating habits. History of falls. Memory and ability to understand (cognition). Work and work Statistician. Reproductive health. Screening  You may have the following tests or measurements: Height, weight, and BMI. Blood pressure. Lipid and cholesterol levels. These may be checked every 5 years, or more frequently if you are over 40 years old. Skin check. Lung cancer screening. You may have this screening every year starting at age 33 if you have a 30-pack-year history of smoking and currently smoke or have quit within the past 15 years. Fecal occult blood test (FOBT) of the stool. You may have this test every year starting at age 60. Flexible sigmoidoscopy or colonoscopy. You may have a sigmoidoscopy every 5 years or a colonoscopy every 10 years starting at age 70. Hepatitis C blood test. Hepatitis B blood test. Sexually transmitted disease (STD) testing. Diabetes screening. This is done by checking your blood sugar (glucose) after you have not eaten for a while (fasting). You may have this done every 1-3 years. Bone density scan. This is done to screen for osteoporosis. You may have this done  starting at age 38. Mammogram. This may be done every 1-2 years. Talk to your health care provider about how often you should have regular mammograms. Talk with your health care provider about your test results, treatment options, and if necessary, the need for more tests. Vaccines  Your health care provider may recommend certain vaccines, such as: Influenza vaccine. This is recommended every year. Tetanus, diphtheria, and acellular pertussis (Tdap,  Td) vaccine. You may need a Td booster every 10 years. Zoster vaccine. You may need this after age 32. Pneumococcal 13-valent conjugate (PCV13) vaccine. One dose is recommended after age 64. Pneumococcal polysaccharide (PPSV23) vaccine. One dose is recommended after age 28. Talk to your health care provider about which screenings and vaccines you need and how often you need them. This information is not intended to replace advice given to you by your health care provider. Make sure you discuss any questions you have with your health care provider. Document Released: 12/18/2015 Document Revised: 08/10/2016 Document Reviewed: 09/22/2015 Elsevier Interactive Patient Education  2017 [place] Prevention in the Home Falls can cause injuries. They can happen to people of all ages. There are many things you can do to make your home safe and to help prevent falls. What can I do on the outside of my home? Regularly fix the edges of walkways and driveways and fix any cracks. Remove anything that might make you trip as you walk through a door, such as a raised step or threshold. Trim any bushes or trees on the path to your home. Use bright outdoor lighting. Clear any walking paths of anything that might make someone trip, such as rocks or tools. Regularly check to see if handrails are loose or broken. Make sure that both sides of any steps have handrails. Any raised decks and porches should have guardrails on the edges. Have any leaves, snow, or ice cleared regularly. Use sand or salt on walking paths during winter. Clean up any spills in your garage right away. This includes oil or grease spills. What can I do in the bathroom? Use night lights. Install grab bars by the toilet and in the tub and shower. Do not use towel bars as grab bars. Use non-skid mats or decals in the tub or shower. If you need to sit down in the shower, use a plastic, non-slip stool. Keep the floor dry. Clean up any  water that spills on the floor as soon as it happens. Remove soap buildup in the tub or shower regularly. Attach bath mats securely with double-sided non-slip rug tape. Do not have throw rugs and other things on the floor that can make you trip. What can I do in the bedroom? Use night lights. Make sure that you have a light by your bed that is easy to reach. Do not use any sheets or blankets that are too big for your bed. They should not hang down onto the floor. Have a firm chair that has side arms. You can use this for support while you get dressed. Do not have throw rugs and other things on the floor that can make you trip. What can I do in the kitchen? Clean up any spills right away. Avoid walking on wet floors. Keep items that you use a lot in easy-to-reach places. If you need to reach something above you, use a strong step stool that has a grab bar. Keep electrical cords out of the way. Do not use floor polish or wax that  makes floors slippery. If you must use wax, use non-skid floor wax. Do not have throw rugs and other things on the floor that can make you trip. What can I do with my stairs? Do not leave any items on the stairs. Make sure that there are handrails on both sides of the stairs and use them. Fix handrails that are broken or loose. Make sure that handrails are as long as the stairways. Check any carpeting to make sure that it is firmly attached to the stairs. Fix any carpet that is loose or worn. Avoid having throw rugs at the top or bottom of the stairs. If you do have throw rugs, attach them to the floor with carpet tape. Make sure that you have a light switch at the top of the stairs and the bottom of the stairs. If you do not have them, ask someone to add them for you. What else can I do to help prevent falls? Wear shoes that: Do not have high heels. Have rubber bottoms. Are comfortable and fit you well. Are closed at the toe. Do not wear sandals. If you use a  stepladder: Make sure that it is fully opened. Do not climb a closed stepladder. Make sure that both sides of the stepladder are locked into place. Ask someone to hold it for you, if possible. Clearly mark and make sure that you can see: Any grab bars or handrails. First and last steps. Where the edge of each step is. Use tools that help you move around (mobility aids) if they are needed. These include: Canes. Walkers. Scooters. Crutches. Turn on the lights when you go into a dark area. Replace any light bulbs as soon as they burn out. Set up your furniture so you have a clear path. Avoid moving your furniture around. If any of your floors are uneven, fix them. If there are any pets around you, be aware of where they are. Review your medicines with your doctor. Some medicines can make you feel dizzy. This can increase your chance of falling. Ask your doctor what other things that you can do to help prevent falls. This information is not intended to replace advice given to you by your health care provider. Make sure you discuss any questions you have with your health care provider. Document Released: 09/17/2009 Document Revised: 04/28/2016 Document Reviewed: 12/26/2014 Elsevier Interactive Patient Education  2017 [employer].

## 2021-09-20 ENCOUNTER — Other Ambulatory Visit: Payer: Self-pay

## 2021-09-20 ENCOUNTER — Telehealth: Payer: Self-pay | Admitting: Family

## 2021-09-20 DIAGNOSIS — E041 Nontoxic single thyroid nodule: Secondary | ICD-10-CM

## 2021-09-20 MED ORDER — LEVOTHYROXINE SODIUM 50 MCG PO TABS: 50.0000 ug | ORAL_TABLET | Freq: Every morning | ORAL | 1 refills | Status: DC

## 2021-09-20 NOTE — Telephone Encounter (Signed)
Patient is calling in to request a refill on her levothyroxine (SYNTHROID) 50 MCG tablet.Please send in to the [facility] mail order pharmacy.

## 2021-09-20 NOTE — Telephone Encounter (Signed)
Synthroid sent to [facility] for patient.

## 2021-09-22 ENCOUNTER — Ambulatory Visit: Payer: Medicare Other | Admitting: Family

## 2021-10-12 LAB — HM DEXA SCAN

## 2021-11-07 ENCOUNTER — Other Ambulatory Visit: Payer: Self-pay | Admitting: Family

## 2021-11-07 DIAGNOSIS — R030 Elevated blood-pressure reading, without diagnosis of hypertension: Secondary | ICD-10-CM

## 2021-11-17 ENCOUNTER — Telehealth: Payer: Self-pay | Admitting: Family

## 2021-11-17 DIAGNOSIS — Z1231 Encounter for screening mammogram for malignant neoplasm of breast: Secondary | ICD-10-CM

## 2021-11-17 NOTE — Telephone Encounter (Signed)
Order and sch mammogram in late january 2023 Thank you

## 2021-11-17 NOTE — Telephone Encounter (Signed)
Pt called in stating that she had reach out to [facility] to schedule her mammogram appt. Pt stated that they advised her that she needs to see her PCP first before she can get the mammogram done. Pt was wondering if she needs to make an appointment to see NP Arnett or can someone call over to [facility] to schedule the mammogram appt for Pt. Pt requesting callback.

## 2021-11-26 NOTE — Progress Notes (Signed)
Formatting of this note might be different from the original.  Reclast Infusion:  Zoledronic Acid 5mg /172mL intravenous over 30 minutes.   Patient pre-medicated with Tylenol. Patient consumed extra water. Patient has had infusion before without complications.  IV: 24G 3/4in.   Attempts: 1  Tolerated well. Blood return noted. Flushed without pain or signs of infiltration.   Patient's vital signs stable throughout infusion. IV site clean, dry, and intact. No signs of infiltration.   Patients IV removed. Catheter intact. Site is clean and dry. Patient stable to discharge. Will continue to hydrate at home. Will call the office if any problems.     Electronically signed by [id number], RN at 11/26/2021 12:25 PM EST

## 2021-12-10 ENCOUNTER — Encounter: Payer: Self-pay | Admitting: Family

## 2021-12-10 ENCOUNTER — Other Ambulatory Visit: Payer: Self-pay

## 2021-12-10 DIAGNOSIS — Z1231 Encounter for screening mammogram for malignant neoplasm of breast: Secondary | ICD-10-CM

## 2021-12-10 NOTE — Addendum Note (Signed)
Addended by: Earlyne Iba on: 12/10/2021 08:23 AM   Modules accepted: Orders

## 2021-12-14 ENCOUNTER — Ambulatory Visit (INDEPENDENT_AMBULATORY_CARE_PROVIDER_SITE_OTHER): Payer: Medicare Other | Admitting: Dermatology

## 2021-12-14 ENCOUNTER — Other Ambulatory Visit: Payer: Self-pay

## 2021-12-14 DIAGNOSIS — I781 Nevus, non-neoplastic: Secondary | ICD-10-CM

## 2021-12-14 DIAGNOSIS — L821 Other seborrheic keratosis: Secondary | ICD-10-CM

## 2021-12-14 DIAGNOSIS — R238 Other skin changes: Secondary | ICD-10-CM

## 2021-12-14 DIAGNOSIS — L578 Other skin changes due to chronic exposure to nonionizing radiation: Secondary | ICD-10-CM

## 2021-12-14 DIAGNOSIS — L719 Rosacea, unspecified: Secondary | ICD-10-CM

## 2021-12-14 DIAGNOSIS — Z1283 Encounter for screening for malignant neoplasm of skin: Secondary | ICD-10-CM

## 2021-12-14 DIAGNOSIS — L439 Lichen planus, unspecified: Secondary | ICD-10-CM | POA: Diagnosis not present

## 2021-12-14 DIAGNOSIS — D1801 Hemangioma of skin and subcutaneous tissue: Secondary | ICD-10-CM

## 2021-12-14 DIAGNOSIS — L309 Dermatitis, unspecified: Secondary | ICD-10-CM

## 2021-12-14 DIAGNOSIS — L570 Actinic keratosis: Secondary | ICD-10-CM | POA: Diagnosis not present

## 2021-12-14 DIAGNOSIS — L817 Pigmented purpuric dermatosis: Secondary | ICD-10-CM | POA: Diagnosis not present

## 2021-12-14 DIAGNOSIS — D229 Melanocytic nevi, unspecified: Secondary | ICD-10-CM

## 2021-12-14 DIAGNOSIS — L858 Other specified epidermal thickening: Secondary | ICD-10-CM

## 2021-12-14 MED ORDER — TACROLIMUS 0.1 % EX OINT: TOPICAL_OINTMENT | Freq: Two times a day (BID) | CUTANEOUS | 3 refills | Status: DC

## 2021-12-14 MED ORDER — CLOBETASOL PROPIONATE 0.05 % EX OINT: TOPICAL_OINTMENT | CUTANEOUS | 3 refills | Status: AC

## 2021-12-14 NOTE — Patient Instructions (Addendum)
Can use vaseline or aquaphor at vaginal area to help with irritation.   Can use tacrolimus 0.1 % ointment to itchy areas of legs.   For redness at face  For temporary redness reduction Mix 1 bottle of Afrin original in 1 bottle of Cerave PM or cetaphil, shake well, and apply 1 hour before you want redness to be improved in the morning     Topical steroids (such as triamcinolone, fluocinolone, fluocinonide, mometasone, clobetasol, halobetasol, betamethasone, hydrocortisone) can cause thinning and lightening of the skin if they are used for too long in the same area. Your physician has selected the right strength medicine for your problem and area affected on the body. Please use your medication only as directed by your physician to prevent side effects.   Gentle Skin Care Guide  1. Bathe no more than once a day.  2. Avoid bathing in hot water  3. Use a mild soap like Dove, Vanicream, Cetaphil, CeraVe. Can use Lever 2000 or Cetaphil antibacterial soap  4. Use soap only where you need it. On most days, use it under your arms, between your legs, and on your feet. Let the water rinse other areas unless visibly dirty.  5. When you get out of the bath/shower, use a towel to gently blot your skin dry, don't rub it.  6. While your skin is still a little damp, apply a moisturizing cream such as Vanicream, CeraVe, Cetaphil, Eucerin, Sarna lotion or plain Vaseline Jelly. For hands apply Neutrogena [place] Hand Cream or Excipial Hand Cream.  7. Reapply moisturizer any time you start to itch or feel dry.  8. Sometimes using free and clear laundry detergents can be helpful. Fabric softener sheets should be avoided. Downy Free & Gentle liquid, or any liquid fabric softener that is free of dyes and perfumes, it acceptable to use  9. If your doctor has given you prescription creams you may apply moisturizers over them       Melanoma ABCDEs  Melanoma is the most dangerous type of skin cancer,  and is the leading cause of death from skin disease.  You are more likely to develop melanoma if you: Have light-colored skin, light-colored eyes, or red or blond hair Spend a lot of time in the sun Tan regularly, either outdoors or in a tanning bed Have had blistering sunburns, especially during childhood Have a close family member who has had a melanoma Have atypical moles or large birthmarks  Early detection of melanoma is key since treatment is typically straightforward and cure rates are extremely high if we catch it early.   The first sign of melanoma is often a change in a mole or a new dark spot.  The ABCDE system is a way of remembering the signs of melanoma.  A for asymmetry:  The two halves do not match. B for border:  The edges of the growth are irregular. C for color:  A mixture of colors are present instead of an even brown color. D for diameter:  Melanomas are usually (but not always) greater than 26mm - the size of a pencil eraser. E for evolution:  The spot keeps changing in size, shape, and color.  Please check your skin once per month between visits. You can use a small mirror in front and a large mirror behind you to keep an eye on the back side or your body.   If you see any new or changing lesions before your next follow-up, please call to schedule  a visit.  Please continue daily skin protection including broad spectrum sunscreen SPF 30+ to sun-exposed areas, reapplying every 2 hours as needed when you're outdoors.   Staying in the shade or wearing long sleeves, sun glasses (UVA+UVB protection) and wide brim hats (4-inch brim around the entire circumference of the hat) are also recommended for sun protection.    If You Need Anything After Your Visit  If you have any questions or concerns for your doctor, please call our main line at [phone] and press option 4 to reach your doctor's medical assistant. If no one answers, please leave a voicemail as directed and we  will return your call as soon as possible. Messages left after 4 pm will be answered the following business day.   You may also send [place] a message via [place]. We typically respond to MyChart messages within 1-2 business days.  For prescription refills, please ask your pharmacy to contact our office. Our fax number is [phone].  If you have an urgent issue when the clinic is closed that cannot wait until the next business day, you can page your doctor at the number below.    Please note that while we do our best to be available for urgent issues outside of office hours, we are not available 24/7.   If you have an urgent issue and are unable to reach [place], you may choose to seek medical care at your doctor's office, retail clinic, urgent care center, or emergency room.  If you have a medical emergency, please immediately call 911 or go to the emergency department.  Pager Numbers  - Dr. Nehemiah Massed: [phone]  - Dr. Laurence Ferrari: [phone]  - Dr. Nicole Kindred: [phone]  In the event of inclement weather, please call our main line at [phone] for an update on the status of any delays or closures.  Dermatology Medication Tips: Please keep the boxes that topical medications come in in order to help keep track of the instructions about where and how to use these. Pharmacies typically print the medication instructions only on the boxes and not directly on the medication tubes.   If your medication is too expensive, please contact our office at [phone] option 4 or send [place] a message through [place].   We are unable to tell what your co-pay for medications will be in advance as this is different depending on your insurance coverage. However, we may be able to find a substitute medication at lower cost or fill out paperwork to get insurance to cover a needed medication.   If a prior authorization is required to get your medication covered by your insurance company, please allow [place] 1-2  business days to complete this process.  Drug prices often vary depending on where the prescription is filled and some pharmacies may offer cheaper prices.  The website www.goodrx.com contains coupons for medications through different pharmacies. The prices here do not account for what the cost may be with help from insurance (it may be cheaper with your insurance), but the website can give you the price if you did not use any insurance.  - You can print the associated coupon and take it with your prescription to the pharmacy.  - You may also stop by our office during regular business hours and pick up a GoodRx coupon card.  - If you need your prescription sent electronically to a different pharmacy, notify our office through [facility] or by phone at [phone] option 4.     Si  Usted Necesita Algo Despus de Su Visita  Tambin puede enviarnos un mensaje a travs de Pharmacist, community. Por lo general respondemos a los mensajes de MyChart en el transcurso de 1 a 2 das hbiles.  Para renovar recetas, por favor pida a su farmacia que se ponga en contacto con nuestra oficina. Harland Dingwall de fax es [place] [phone].  Si tiene un asunto urgente cuando la clnica est cerrada y que no puede esperar hasta el siguiente da hbil, puede llamar/localizar a su doctor(a) al nmero que aparece a continuacin.   Por favor, tenga en cuenta que aunque hacemos todo lo posible para estar disponibles para asuntos urgentes fuera del horario de [place], no estamos disponibles las 24 horas del da, los 7 das de la [place].   Si tiene un problema urgente y no puede comunicarse con nosotros, puede optar por buscar atencin mdica  en el consultorio de su doctor(a), en una clnica privada, en un centro de atencin urgente o en una sala de emergencias.  Si tiene Engineering geologist, por favor llame inmediatamente al 911 o vaya a la sala de emergencias.  Nmeros de bper  - Dr. Nehemiah Massed: [phone]  - Dra.  Moye: [phone]  - Dra. Nicole Kindred: [phone]  En caso de inclemencias del [place], por favor llame a Johnsie Kindred principal al [phone] para una actualizacin sobre el [place] de cualquier retraso o cierre.  Consejos para la medicacin en dermatologa: Por favor, guarde las cajas en las que vienen los medicamentos de uso tpico para ayudarle a seguir las instrucciones sobre dnde y cmo usarlos. Las farmacias generalmente imprimen las instrucciones del medicamento slo en las cajas y no directamente en los tubos del [place].   Si su medicamento es muy caro, por favor, pngase en contacto con Zigmund Daniel llamando al [phone] y presione la opcin 4 o envenos un mensaje a travs de Pharmacist, community.   No podemos decirle cul ser su copago por los medicamentos por adelantado ya que esto es diferente dependiendo de la cobertura de su seguro. Sin embargo, es posible que podamos encontrar un medicamento sustituto a Electrical engineer un formulario para que el seguro cubra el medicamento que se considera necesario.   Si se requiere una autorizacin previa para que su compaa de seguros [place] su medicamento, por favor permtanos de 1 a 2 das hbiles para completar este proceso.  Los precios de los medicamentos varan con frecuencia dependiendo del Environmental consultant de dnde se surte la receta y alguna farmacias pueden ofrecer precios ms baratos.  El sitio web www.goodrx.com tiene cupones para medicamentos de Airline pilot. Los precios aqu no tienen en cuenta lo que podra costar con la ayuda del seguro (puede ser ms barato con su seguro), pero el sitio web puede darle el precio si no utiliz Research scientist (physical sciences).  - Puede imprimir el cupn correspondiente y llevarlo con su receta a la farmacia.  - Tambin puede pasar por nuestra oficina durante el horario de atencin regular y Charity fundraiser una tarjeta de cupones de GoodRx.  - Si necesita que su receta se enve electrnicamente a una farmacia diferente,  informe a nuestra oficina a travs de MyChart de [place] o por telfono llamando al [phone] y presione la opcin 4.

## 2021-12-14 NOTE — Progress Notes (Signed)
Follow-Up Visit   Subjective  Jill Michael is a 71 y.o. female who presents for the following: Follow-up (Patient here today for 1 year tbse. 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 She also has history of dermatitis at face Patient request hydrocoritsone 2.5 % refill and clobetasol refill. ).  The patient presents for Total-Body Skin Exam (TBSE) for skin cancer screening and mole check.  The patient has spots, moles and lesions to be evaluated, some may be new or changing and the patient has concerns that these could be cancer.   The following portions of the chart were reviewed this encounter and updated as appropriate:      Review of Systems: No other skin or systemic complaints except as noted in HPI or Assessment and Plan.   Objective  Well appearing patient in no apparent distress; mood and affect are within normal limits.  A full examination was performed including scalp, head, eyes, ears, nose, lips, neck, chest, axillae, abdomen, back, buttocks, bilateral upper extremities, bilateral lower extremities, hands, feet, fingers, toes, fingernails, and toenails. All findings within normal limits unless otherwise noted below.  Head - Anterior (Face)  erythema with telangiectasias at nose and malar cheeks   bilateral lower legs Cayenne pepper macules with trace pitting edema bilateral lower legs at ankles, pink patch R medial ankle- pt states it's itchy  vaginal area Mild erythema with hypopigmentation of inferior introitus, she has been having increased symptoms recently from walking a lot.  left upper eyebrow x 1, right lateral eye x 1 (2) Pink scaly macules, didn't clear with HC 2.5% cream  right lower lip 3 mm soft purple papule   forehead, temples, fingernail folds right temple with prominent vasculature,  mild erythema with xerosis of bilateral nail folds. Pt states that she gets itchy rashes on forehead and  temples   Assessment & Plan  Rosacea Head - Anterior (Face)  Rosacea is a chronic progressive skin condition usually affecting the face of adults, causing redness and/or acne bumps. It is treatable but not curable. It sometimes affects the eyes (ocular rosacea) as well. It may respond to topical and/or systemic medication and can flare with stress, sun exposure, alcohol, exercise and some foods.  Daily application of broad spectrum spf 30+ sunscreen to face is recommended to reduce flares.  Pt mainly concerned about redness. Pt has tried Rhofade samples in past, but isn't covered by insurance. Discussed BBL procedure. For temporary redness reduction Mix 1 bottle of Afrin original in 1 bottle of Cerave PM, shake well, and apply 1 hour before you want redness to be improved in the morning   Schamberg's purpura bilateral lower legs  Benign chronic condition often related to persistent lower leg swelling  Start daily compression stockings apply in morning and can remove in evening.  Start tacrolimus ointment to lower legs 1 - 2 times daily to inflamed itchy areas.      Related Medications tacrolimus (PROTOPIC) 0.1 % ointment Apply topically 2 (two) times daily. apply 1 - 2 times daily to affected areas of forehead,  temples, finger nail folds  and  body including lower legs  Lichen planus vaginal area  Chronic condition with duration or expected duration over one year. Condition is bothersome to patient. Currently flared.   Continue Clobetasol ointment 0.05 apply topically aas vaginal area starting qd/bid until symptoms improve then use 2 - 3x/week for maintenance. Avoid applying to face and axilla. Use as directed.  Apply thick layer vaseline to aa prior to going on long walks for a skin protectant  Topical steroids (such as triamcinolone, fluocinolone, fluocinonide, mometasone, clobetasol, halobetasol, betamethasone, hydrocortisone) can cause thinning and lightening of the skin  if they are used for too long in the same area. Your physician has selected the right strength medicine for your problem and area affected on the body. Please use your medication only as directed by your physician to prevent side effects.    Related Medications clobetasol ointment (TEMOVATE) 0.05 % Apply to AAs vaginal area starting BID until symptoms improve. Then use 2-3x/wk. Avoid face.  Actinic keratosis (2) left upper eyebrow x 1, right lateral eye x 1  Actinic keratoses are precancerous spots that appear secondary to cumulative UV radiation exposure/sun exposure over time. They are chronic with expected duration over 1 year. A portion of actinic keratoses will progress to squamous cell carcinoma of the skin. It is not possible to reliably predict which spots will progress to skin cancer and so treatment is recommended to prevent development of skin cancer.  Recommend daily broad spectrum sunscreen SPF 30+ to sun-exposed areas, reapply every 2 hours as needed.  Recommend staying in the shade or wearing long sleeves, sun glasses (UVA+UVB protection) and wide brim hats (4-inch brim around the entire circumference of the hat). Call for new or changing lesions.  Destruction of lesion - left upper eyebrow x 1, right lateral eye x 1  Destruction method: cryotherapy   Informed consent: discussed and consent obtained   Timeout:  patient name, date of birth, surgical site, and procedure verified Lesion destroyed using liquid nitrogen: Yes   Region frozen until ice ball extended beyond lesion: Yes   Outcome: patient tolerated procedure well with no complications   Post-procedure details: wound care instructions given   Additional details:  Prior to procedure, discussed risks of blister formation, small wound, skin dyspigmentation, or rare scar following cryotherapy. Recommend Vaseline ointment to treated areas while healing.   Venous lake right lower lip  Benign, observe.      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  Gentle skin care handout provided.     tacrolimus (PROTOPIC) 0.1 % ointment - forehead, temples, fingernail folds Apply topically 2 (two) times daily. apply 1 - 2 times daily to affected areas of forehead,  temples, finger nail folds  and  body including lower legs  Lentigines - Scattered tan macules - Due to sun exposure - Benign-appearing, observe - Recommend daily broad spectrum sunscreen SPF 30+ to sun-exposed areas, reapply every 2 hours as needed. - Call for any changes  Seborrheic Keratoses - Stuck-on, waxy, tan-brown papules and/or plaques  - Benign-appearing - Discussed benign etiology and prognosis. - Observe - Call for any changes  Keratosis Pilaris - Tiny follicular keratotic papules - Benign. Genetic in nature. No cure. - Observe. - If desired, patient can use an emollient (moisturizer) containing ammonium lactate, urea or salicylic acid once a day to smooth the area  Melanocytic Nevi - Tan-brown and/or pink-flesh-colored symmetric macules and papules - Benign appearing on exam today - Observation - Call clinic for new or changing moles - Recommend daily use of broad spectrum spf  30+ sunscreen to sun-exposed areas.   Hemangiomas - Red papules - Discussed benign nature - Observe - Call for any changes  Actinic Damage - Chronic condition, secondary to cumulative UV/sun exposure - diffuse scaly erythematous macules with underlying dyspigmentation - Recommend daily broad spectrum sunscreen SPF 30+ to sun-exposed areas, reapply every 2 hours as needed.  - Staying in the shade or wearing long sleeves, sun glasses  (UVA+UVB protection) and wide brim hats (4-inch brim around the entire circumference of the hat) are also recommended for sun protection.  - Call for new or changing lesions.  Skin cancer screening performed today. Return for 1 year tbse and 6 mos LP. I, Ruthell Rummage, CMA, am acting as scribe for Brendolyn Patty, MD.  Documentation: I have reviewed the above documentation for accuracy and completeness, and I agree with the above.  Brendolyn Patty MD

## 2022-01-11 ENCOUNTER — Ambulatory Visit: Payer: Medicare Other | Admitting: Family

## 2022-01-27 ENCOUNTER — Other Ambulatory Visit: Payer: Self-pay

## 2022-01-27 ENCOUNTER — Ambulatory Visit
Admission: RE | Admit: 2022-01-27 | Discharge: 2022-01-27 | Disposition: A | Discharge status: Home / Self Care | Payer: Medicare Other | Source: Ambulatory Visit | Attending: Family | Admitting: Family

## 2022-01-27 DIAGNOSIS — Z1231 Encounter for screening mammogram for malignant neoplasm of breast: Secondary | ICD-10-CM | POA: Insufficient documentation

## 2022-02-18 ENCOUNTER — Encounter: Payer: Self-pay | Admitting: Family

## 2022-02-18 ENCOUNTER — Other Ambulatory Visit: Payer: Self-pay

## 2022-02-18 ENCOUNTER — Ambulatory Visit (INDEPENDENT_AMBULATORY_CARE_PROVIDER_SITE_OTHER): Payer: Medicare Other | Admitting: Family

## 2022-02-18 VITALS — BP 128/80 | HR 102 | Temp 97.8°F | Ht 67.0 in | Wt 156.6 lb

## 2022-02-18 DIAGNOSIS — E785 Hyperlipidemia, unspecified: Secondary | ICD-10-CM | POA: Diagnosis not present

## 2022-02-18 DIAGNOSIS — M810 Age-related osteoporosis without current pathological fracture: Secondary | ICD-10-CM

## 2022-02-18 DIAGNOSIS — E039 Hypothyroidism, unspecified: Secondary | ICD-10-CM

## 2022-02-18 DIAGNOSIS — I10 Essential (primary) hypertension: Secondary | ICD-10-CM | POA: Diagnosis not present

## 2022-02-18 NOTE — Patient Instructions (Signed)
Nice to see you!   

## 2022-02-18 NOTE — Assessment & Plan Note (Signed)
She continues to follow with endocrinology and she is on the reclast.  ?

## 2022-02-18 NOTE — Assessment & Plan Note (Signed)
Euthyroid.  Continue Synthroid 50 mcg ?

## 2022-02-18 NOTE — Progress Notes (Signed)
? ?Subjective:  ? ? Patient ID: Jill Michael, female    DOB: 10-09-1951, 71 y.o.   MRN: [id number] ? ?CC: Jill Michael is a 71 y.o. female who presents today for follow up.  ? ?HPI: Feels well today ?[place] at the park and exercises with stretching and weights at home.  ? ? ? ? ?HTN- compliant with amlodipine 2.'5mg'$ . No cp ? ?HLD- compliant with crestor 2.'5mg'$  once per week without myalgia.   ? ?Osteoporosis - compliant with reclast. She follows with endocrine.  ? ?Hypothyroidism- compliant with synthroid 22mg.  TSH 2.9, 4 months ago. ? ? ?HISTORY:  ?Past Medical History:  ?Diagnosis Date  ? Actinic keratosis   ? Allergy   ? hay fever  ? Arthritis   ? Arthritis   ? right knee, followed by KJefm Bryant ? Chicken pox   ? Low back pain   ? recurrent  ? Osteopenia   ? Scoliosis   ? Scoliosis   ? UTI (urinary tract infection)   ? Vaginal atrophy   ? ?Past Surgical History:  ?Procedure Laterality Date  ? BREAST BIOPSY Right 2000 and 2005  ? fibroadenoma right breast x2- both Bx's done under stereo  ? NASAL SEPTUM SURGERY    ? partial knee replacement Right 2013  ? Dr. KJefm Bryant ? semithyroidectomy of right side  08/2106  ? TUBAL LIGATION    ? VAGINAL DELIVERY    ? 2  ? ?Family History  ?Problem Relation Age of Onset  ? Cancer Mother   ?     breast  ? Breast cancer Mother [phone] ? Cancer Father   ?     lung  ? CAD Brother   ? ? ?Allergies: Amitriptyline, Gabapentin, Penicillin v potassium, and Penicillins ?Current Outpatient Medications on File Prior to Visit  ?Medication Sig Dispense Refill  ? amLODipine (NORVASC) 2.5 MG tablet TAKE 1 TABLET DAILY 90 tablet 3  ? Calcium 200 MG TABS Take 800 mg by mouth daily.    ? Cholecalciferol (VITAMIN D) 2000 UNITS CAPS Take 1 capsule by mouth daily.    ? clobetasol ointment (TEMOVATE) 0.05 % Apply to AAs vaginal area starting BID until symptoms improve. Then use 2-3x/wk. Avoid face. 15 g 3  ? cycloSPORINE (RESTASIS) 0.05 % ophthalmic emulsion Place 1 drop into both eyes 2 (two)  times daily. 180 each 3  ? estradiol (ESTRACE) 0.1 MG/GM vaginal cream Use intravaginally 1-3 times per week. 42.5 g 11  ? fexofenadine (ALLEGRA) 30 MG tablet Take 30 mg 2 (two) times daily by mouth.    ? fluticasone (FLONASE) 50 MCG/ACT nasal spray Place 2 sprays into both nostrils daily. 50 g 3  ? hydrocortisone 2.5 % cream Apply topically as directed. Apply once to twice daily as needed for flares 28 g 1  ? levothyroxine (SYNTHROID) 50 MCG tablet Take 1 tablet (50 mcg total) by mouth every morning. 90 tablet 1  ? Omega-3 Fatty Acids (FISH OIL) 1000 MG CAPS Take 1 capsule by mouth.    ? OVER THE COUNTER MEDICATION Take 3 capsules by mouth daily. Instaflex    ? OVER THE COUNTER MEDICATION Take 1 capsule by mouth 2 (two) times daily. Tumeric    ? rosuvastatin (CRESTOR) 5 MG tablet Take 1 tablet (5 mg total) by mouth at bedtime. 90 tablet 1  ? ?No current facility-administered medications on file prior to visit.  ? ? ?Social History  ? ?Tobacco Use  ? Smoking status: Never  ?  Smokeless tobacco: Never  ?Vaping Use  ? Vaping Use: Never used  ?Substance Use Topics  ? Alcohol use: Yes  ?  Comment: occasional  ? Drug use: Yes  ? ? ?Review of Systems  ?Constitutional:  Negative for chills and fever.  ?Respiratory:  Negative for cough.   ?Cardiovascular:  Negative for chest pain and palpitations.  ?Gastrointestinal:  Negative for nausea and vomiting.  ?   ?Objective:  ?  ?BP 128/80 (BP Location: Left Arm, Patient Position: Sitting, Cuff Size: Normal)   Pulse (!) 102   Temp 97.8 ?F (36.6 ?C) (Oral)   Ht '5\' 7"'$  (1.702 m)   Wt 156 lb 9.6 oz (71 kg)   SpO2 96%   BMI 24.53 kg/m?  ?BP Readings from Last 3 Encounters:  ?02/18/22 128/80  ?08/18/21 111/74  ?08/11/21 128/78  ? ?Wt Readings from Last 3 Encounters:  ?02/18/22 156 lb 9.6 oz (71 kg)  ?08/18/21 156 lb (70.8 kg)  ?04/20/21 157 lb (71.2 kg)  ? ? ?Physical Exam ?Vitals reviewed.  ?Constitutional:   ?   Appearance: She is well-developed.  ?Eyes:  ?   Conjunctiva/sclera:  Conjunctivae normal.  ?Cardiovascular:  ?   Rate and Rhythm: Normal rate and regular rhythm.  ?   Pulses: Normal pulses.  ?   Heart sounds: Normal heart sounds.  ?Pulmonary:  ?   Effort: Pulmonary effort is normal.  ?   Breath sounds: Normal breath sounds. No wheezing, rhonchi or rales.  ?Skin: ?   General: Skin is warm and dry.  ?Neurological:  ?   Mental Status: She is alert.  ?Psychiatric:     ?   Speech: Speech normal.     ?   Behavior: Behavior normal.     ?   Thought Content: Thought content normal.  ? ? ?   ?Assessment & Plan:  ? ?Problem List Items Addressed This Visit   ? ?  ? Cardiovascular and Mediastinum  ? HTN (hypertension)  ?  Chronic, stable.  Continue amlodipine 2.5 mg ?  ?  ?  ? Endocrine  ? Hypothyroidism  ?  Euthyroid.  Continue Synthroid 50 mcg ?  ?  ?  ? Musculoskeletal and Integument  ? Osteoporosis  ?  She continues to follow with endocrinology and she is on the reclast.  ?  ?  ? Relevant Medications  ? Calcium 200 MG TABS  ? Other Relevant Orders  ? VITAMIN D 25 Hydroxy (Vit-D Deficiency, Fractures)  ?  ? Other  ? HLD (hyperlipidemia) - Primary  ?  Chronic, stable.  Pending lipid panel.  Continue Crestor 2.5 mg once per week ?  ?  ? Relevant Orders  ? Lipid panel  ? ? ? ?I have discontinued [place] tacrolimus. I am also having her maintain her Vitamin D, OVER THE COUNTER MEDICATION, OVER THE COUNTER MEDICATION, cycloSPORINE, fexofenadine, Fish Oil, estradiol, fluticasone, rosuvastatin, hydrocortisone, levothyroxine, amLODipine, clobetasol ointment, and Calcium. ? ? ?No orders of the defined types were placed in this encounter. ? ? ?Return precautions given.  ? ?Risks, benefits, and alternatives of the medications and treatment plan prescribed today were discussed, and patient expressed understanding.  ? ?Education regarding symptom management and diagnosis given to patient on AVS. ? ?Continue to follow with Burnard Hawthorne, FNP for routine health maintenance.  ? ?Sophronia Varney  [place] and I agreed with plan.  ? ?Mable Paris, FNP ? ? ?

## 2022-02-18 NOTE — Assessment & Plan Note (Signed)
Chronic, stable.  Continue amlodipine 2.5 mg 

## 2022-02-18 NOTE — Assessment & Plan Note (Signed)
Chronic, stable.  Pending lipid panel.  Continue Crestor 2.5 mg once per week ?

## 2022-02-21 ENCOUNTER — Encounter: Payer: Self-pay | Admitting: Family

## 2022-02-22 ENCOUNTER — Other Ambulatory Visit: Payer: Self-pay

## 2022-02-22 DIAGNOSIS — E041 Nontoxic single thyroid nodule: Secondary | ICD-10-CM

## 2022-02-22 DIAGNOSIS — Z0000 Encounter for general adult medical examination without abnormal findings: Secondary | ICD-10-CM

## 2022-02-22 MED ORDER — FLUTICASONE PROPIONATE 50 MCG/ACT NA SUSP: 2.0000 | Freq: Every day | NASAL | 3 refills | Status: AC

## 2022-02-22 MED ORDER — LEVOTHYROXINE SODIUM 50 MCG PO TABS: 50.0000 ug | ORAL_TABLET | Freq: Every morning | ORAL | 1 refills | Status: AC

## 2022-03-03 ENCOUNTER — Telehealth: Payer: Self-pay | Admitting: Family

## 2022-03-03 ENCOUNTER — Other Ambulatory Visit (INDEPENDENT_AMBULATORY_CARE_PROVIDER_SITE_OTHER): Payer: Medicare Other

## 2022-03-03 DIAGNOSIS — E785 Hyperlipidemia, unspecified: Secondary | ICD-10-CM | POA: Diagnosis not present

## 2022-03-03 DIAGNOSIS — M810 Age-related osteoporosis without current pathological fracture: Secondary | ICD-10-CM | POA: Diagnosis not present

## 2022-03-03 LAB — LIPID PANEL
Cholesterol: 150 mg/dL (ref 0–200)
HDL: 53.5 mg/dL (ref >39.00)
LDL Cholesterol: 84 mg/dL (ref 0–99)
NonHDL: 96.96
Total CHOL/HDL Ratio: 3
Triglycerides: 63 mg/dL (ref 0.0–149.0)
VLDL: 12.6 mg/dL (ref 0.0–40.0)

## 2022-03-03 LAB — VITAMIN D 25 HYDROXY (VIT D DEFICIENCY, FRACTURES): VITD: 47.01 ng/mL (ref 30.00–100.00)

## 2022-03-03 NOTE — Telephone Encounter (Signed)
Pt came into office to drop off her bone density results. Placed in colored folder up front ?

## 2022-03-07 NOTE — Telephone Encounter (Signed)
noted 

## 2022-03-17 ENCOUNTER — Encounter: Payer: Self-pay | Admitting: Family

## 2022-03-21 ENCOUNTER — Other Ambulatory Visit: Payer: Self-pay

## 2022-03-21 DIAGNOSIS — N952 Postmenopausal atrophic vaginitis: Secondary | ICD-10-CM

## 2022-03-21 MED ORDER — ROSUVASTATIN CALCIUM 5 MG PO TABS: 5.0000 mg | ORAL_TABLET | Freq: Every day | ORAL | 1 refills | Status: AC

## 2022-03-21 MED ORDER — ESTRADIOL 0.1 MG/GM VA CREA: TOPICAL_CREAM | VAGINAL | 11 refills | Status: AC

## 2022-04-28 IMAGING — MG MM DIGITAL SCREENING BILAT W/ TOMO AND CAD
8 series · 8 of 24 positions shown · non-contrast
Comparison: Previous exam(s).

CLINICAL DATA: Screening.

EXAM:
DIGITAL SCREENING BILATERAL MAMMOGRAM WITH TOMO AND CAD

[R MLO synth-2D]
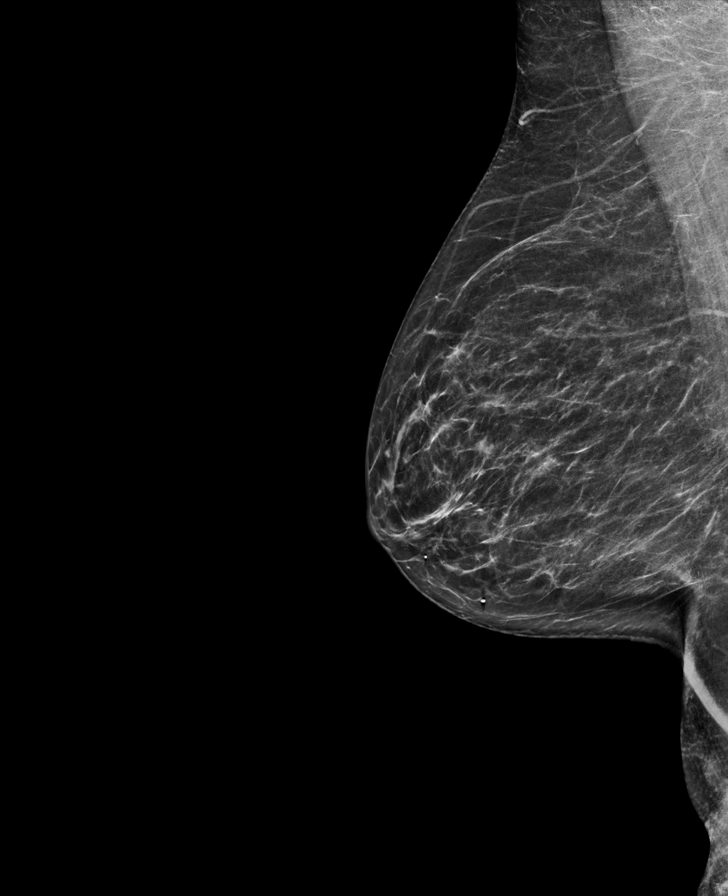

[L CC synth-2D]
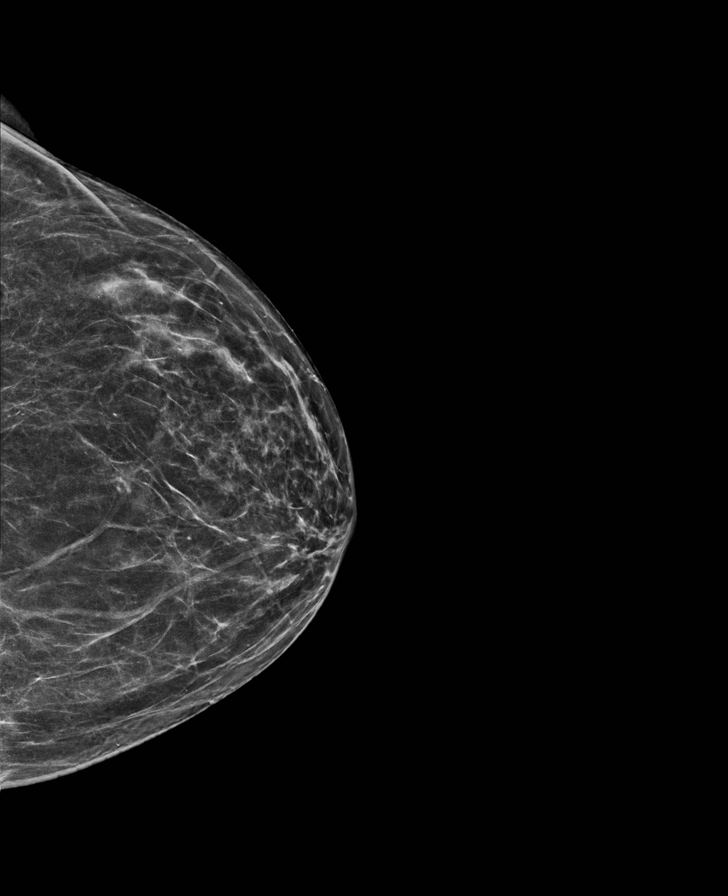

[L MLO synth-2D]
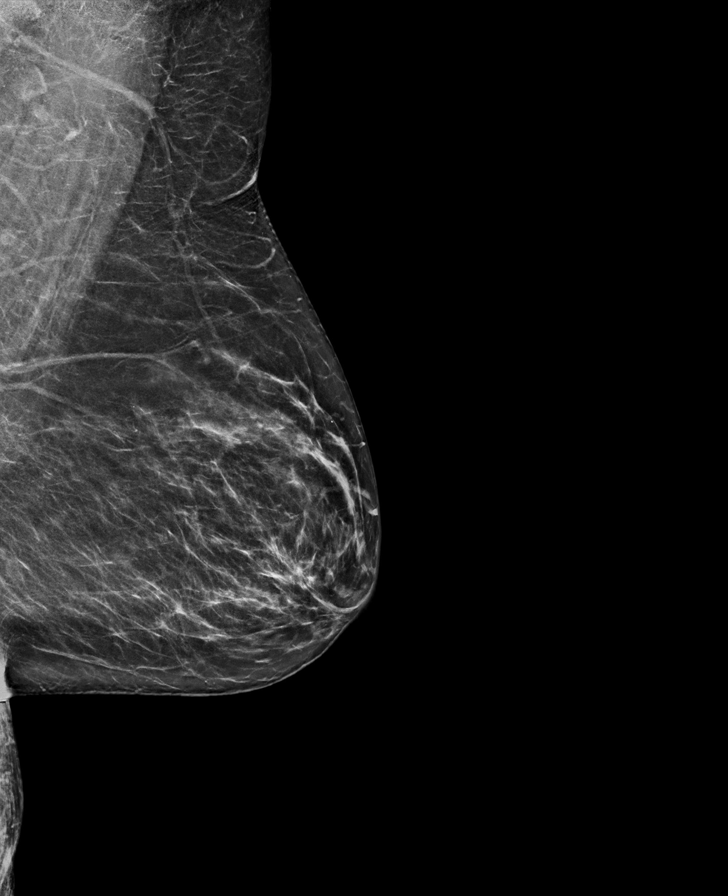

[R CC synth-2D]
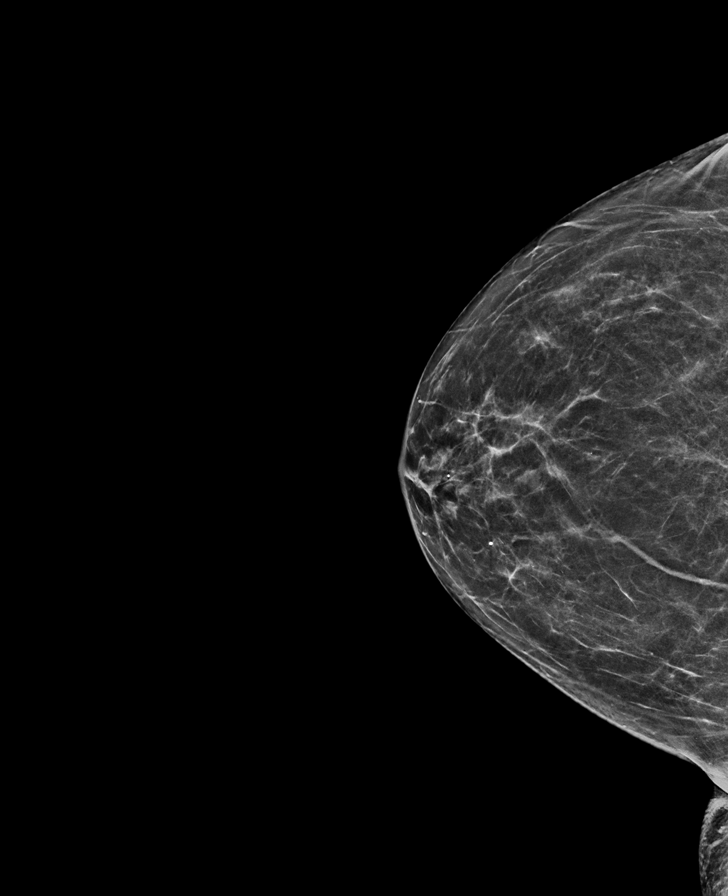

[R CC tomo · tomo slice 29/57.0]
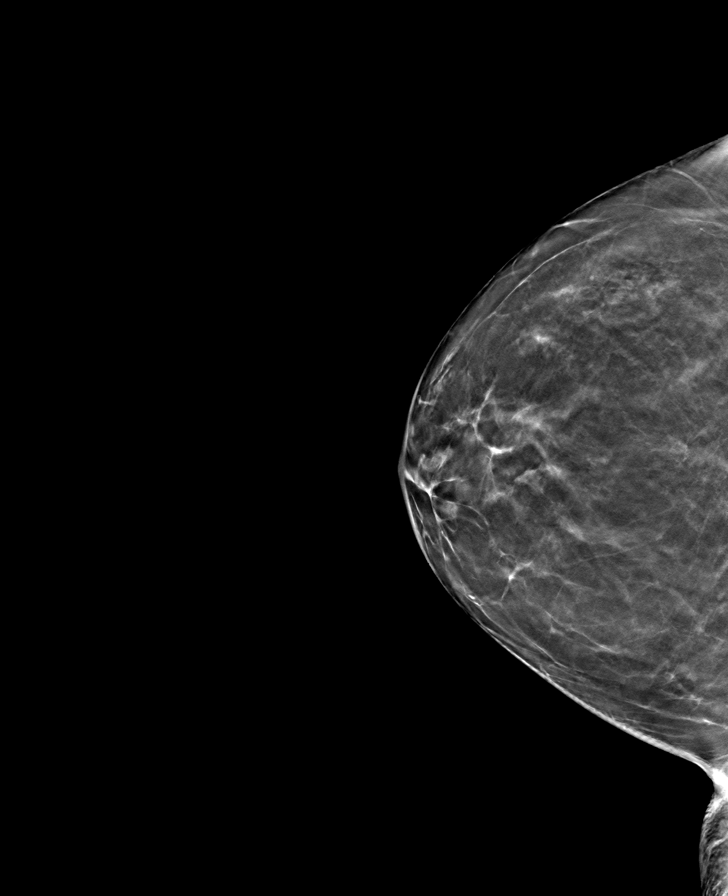

[R MLO tomo · tomo slice 32/63.0]
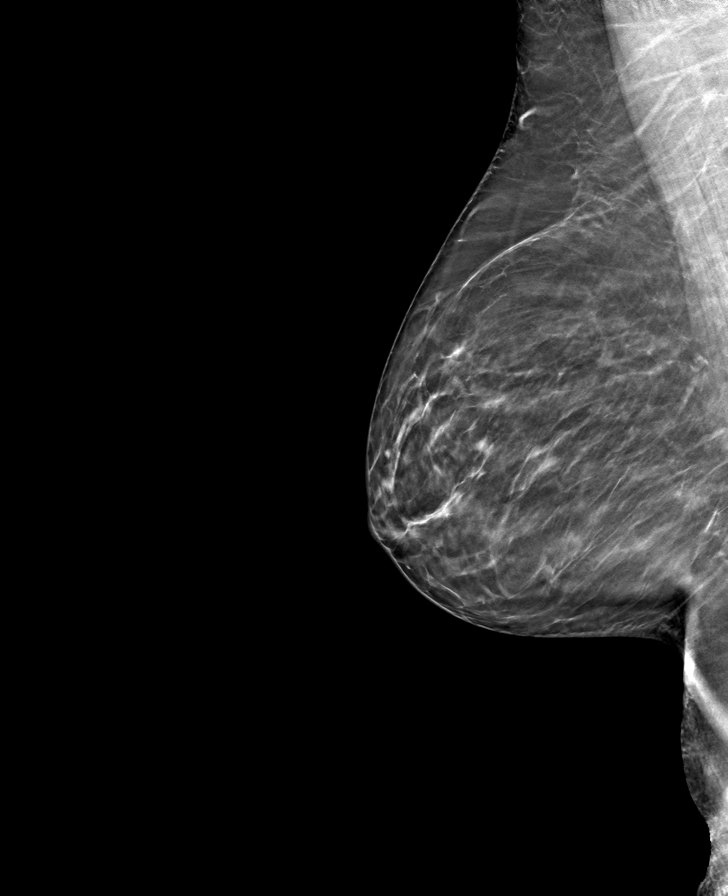

[L CC tomo · tomo slice 31/60.0]
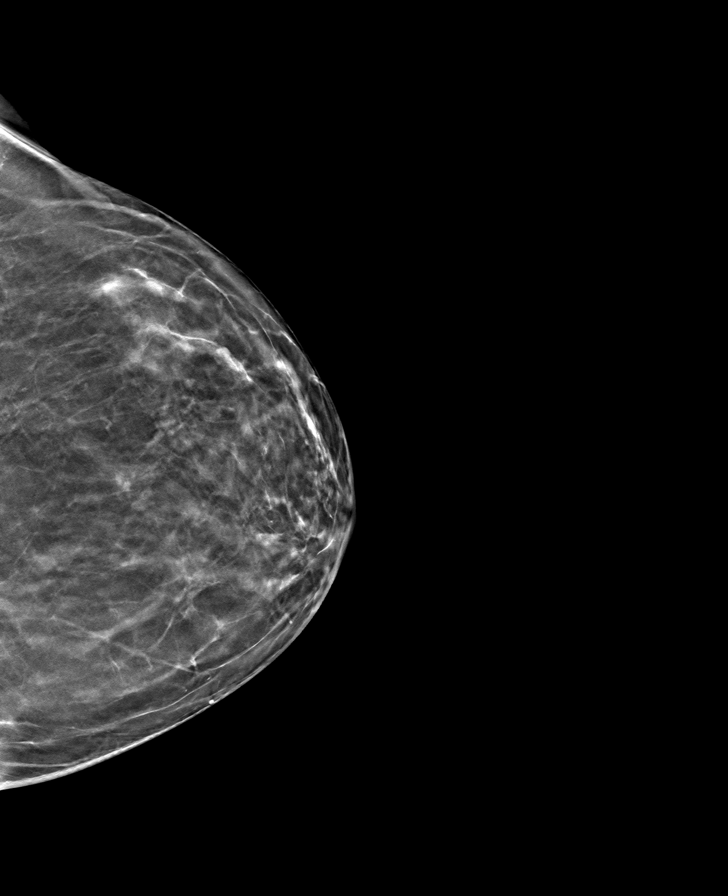

[L MLO tomo · tomo slice 34/67.0]
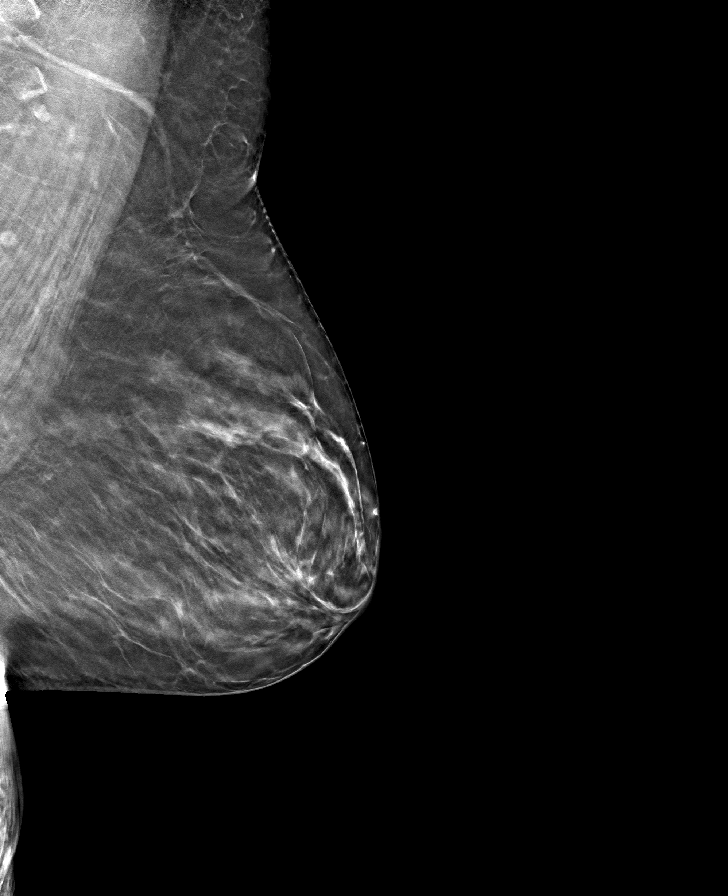

[8 of 24 positions shown; findings below may reference images not displayed]

ACR Breast Density Category b: There are scattered areas of
fibroglandular density.
FINDINGS: There are no findings suspicious for malignancy. The images were
evaluated with computer-aided detection.
IMPRESSION: No mammographic evidence of malignancy. A result letter of this
screening mammogram will be mailed directly to the patient.

RECOMMENDATION:
Screening mammogram in one year. (Code:ZP-7-VX7)

BI-RADS CATEGORY  1: Negative.

## 2022-06-09 ENCOUNTER — Ambulatory Visit: Payer: Medicare Other | Admitting: Dermatology

## 2022-08-17 ENCOUNTER — Telehealth: Payer: Self-pay | Admitting: Family

## 2022-08-17 NOTE — Telephone Encounter (Signed)
Copied from [place] [id number]. Topic: Medicare AWV >> Aug 17, 2022  2:54 PM Devoria Glassing wrote: Reason for CRM: Left message for patient to schedule Annual Wellness Visit.  Please schedule with Nurse Health Advisor Denisa O'Brien-Blaney, LPN at [facility]. This appt can be telephone or office visit.  Please call [phone] ask for [facility]

## 2022-09-12 ENCOUNTER — Telehealth: Payer: Self-pay | Admitting: Family

## 2022-09-12 NOTE — Telephone Encounter (Signed)
Patient moved to [place]

## 2022-09-12 NOTE — Telephone Encounter (Signed)
Formatting of this note might be different from the original.  Patient moved to [facility]  Electronically signed by Ander Slade at 09/12/2022  1:48 PM EDT

## 2022-11-08 ENCOUNTER — Ambulatory Visit: Admit: 2022-11-08 | Discharge: 2022-11-12 | Payer: MEDICARE | Primary: Family

## 2022-11-08 ENCOUNTER — Ambulatory Visit: Admit: 2022-11-08 | Discharge: 2022-11-08 | Payer: MEDICARE | Attending: Physician Assistant | Primary: Family

## 2022-11-08 DIAGNOSIS — M419 Scoliosis, unspecified: Secondary | ICD-10-CM

## 2022-11-10 NOTE — Progress Notes (Signed)
Natalie Sandoval (DOB: Feb 26, 1951) is a 71 y.o. female patient here for evaluation of the following chief complaint(s):  No chief complaint on file.         ASSESSMENT/PLAN:  Below is the assessment and plan developed based on review of pertinent history, physical exam, labs, studies, and medications.    1. Kyphoscoliosis and scoliosis  -     XR SCOLIOSIS SURVEY STANDARD; Future  -     Amb External Referral To Physical Therapy      The patient's radiologic findings have been reviewed with her in detail today.  I am able to compare her x-rays from today with films from 2019, and her curvature appears stable.  She will continue with PT, and continue with over-the-counter medications.  She will return for follow-up visit for repeat x-rays in approximately 1 year, or sooner as needed    Return in about 1 year (around 11/09/2023) for Scoliosis recheck.       SUBJECTIVE/OBJECTIVE:  Natalie Sandoval (DOB: Oct 21, 1951) is a 71 y.o. female who presents today for the following:  No chief complaint on file.       HPI   [name] presents today as a new patient to the practice.  She wishes to establish due to a longstanding history of scoliosis.  She states that she was diagnosed with scoliosis over 30 years ago.  She states that she has remained stable over the years, and has had routine monitoring of her curvature in [facility].  She is just moved to the area and wishes to establish.  She brings her most recent x-rays with her today.  She denies any recent changes in posture.  She reports mild constant but stable low back discomfort without any radicular pain.  No numbness, tingling, or weakness in the legs.  No bowel or bladder changes.  She has established with [facility] here since her move, and has been working diligently with PT.  She feels that overall her posture has improved.  She has had a history of prior radiofrequency ablation in 2018, but has not needed this recently.  She uses Tylenol or ibuprofen  occasionally for pain relief.  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  She also has a family history of scoliosis in both her mom, brother, and other younger family members.    IMAGING:  Xray Result (most recent):  XR SCOLIOSIS SURVEY STANDARD 11/08/2022    Narrative  Scoliosis films today reveal evidence of a levoconvex lumbar scoliosis with a compensatory dextroconvex thoracic curve.  The lumbar curvature measures approximately 21 degrees and the thoracic curve measures approximately 32 degrees which appears stable in comparison to films that the patient brings in from 2019.       MRI Result (most recent):       Allergies   Allergen Reactions    Penicillins     Seasonal        Current Outpatient Medications   Medication Sig Dispense Refill    amLODIPine (NORVASC) 2.5 MG tablet Take 1 tablet by mouth daily      clobetasol (TEMOVATE) 0.05 % ointment Apply to AAs vaginal area starting BID until symptoms improve. Then use 2-3x/wk. Avoid face.      cycloSPORINE (RESTASIS) 0.05 % ophthalmic emulsion Apply 1 drop to eye 2 times daily      estradiol (ESTRACE) 0.1 MG/GM vaginal cream Use intravaginally 1-3 times per week.  fluticasone (FLONASE) 50 MCG/ACT nasal spray 2 sprays by Nasal route daily      hydrocortisone 2.5 % cream Apply topically      levothyroxine (SYNTHROID) 50 MCG tablet Take 1 tablet by mouth every morning      rosuvastatin (CRESTOR) 5 MG tablet Take 1 tablet by mouth nightly      CALCIUM PO Take 800 mg by mouth daily      fexofenadine (ALLEGRA) 180 MG tablet Take 1 tablet by mouth daily      Omega-3 Fatty Acids (FISH OIL) 1000 MG capsule Take 1 capsule by mouth       No current facility-administered medications for this visit.        No past medical history on file.     No past surgical history on file.    No family history on file.     Social History     Tobacco Use    Smoking status: Not on file    Smokeless tobacco: Not on file    Substance Use Topics    Alcohol use: Not on file        Review of Systems   Constitutional: Negative.    HENT: Negative.     Eyes: Negative.    Respiratory: Negative.     Cardiovascular: Negative.    Gastrointestinal: Negative.    Endocrine: Negative.    Genitourinary: Negative.    Musculoskeletal:  Positive for back pain.   Skin: Negative.    Allergic/Immunologic: Negative.    Hematological: Negative.    Psychiatric/Behavioral: Negative.                  Vitals:  There were no vitals taken for this visit.   There is no height or weight on file to calculate BMI.    Physical Exam    Neurologic  Sensory  Light Touch - Intact - Globally.  Overall Assessment of Muscle Strength and Tone reveals  Lower Extremities - Right Iliopsoas - 5/5. Left Iliopsoas - 5/5. Right Tibialis Anterior - 5/5. Left Tibialis Anterior - 5/5. Right Gastroc-Soleus - 5/5. Left Gastroc-Soleus - 5/5. Right EHL - 5/5. Left EHL - 5/5.  General Assessment of Reflexes  Right Ankle - Clonus is not present. Left Ankle - Clonus is not present.  Reflexes (Dermatomes)  2/2 Normal - Left Achilles (L5-S2), Left Knee (L2-4), Right Achilles (L5-S2) and Right Knee (L2-4).    Musculoskeletal  Global Assessment  Examination of related systems reveals - well-developed, well-nourished, in no acute distress, alert and oriented x 3. Gait and Station - normal gait and station and normal posture. Right Lower Extremity - normal strength and tone, normal range of motion without pain and no instability, subluxation or laxity. Left Lower Extremity - normal strength and tone, normal range of motion without pain and no instability, subluxation or laxity.  Spine/Ribs/Pelvis  Cervical Spine - Examination of the cervical spine reveals - no tenderness to palpation, no pain, no swelling, edema or erythema, normal cervical spine movements and normal sensation. Thoracic (Dorsal) Spine - Examination of the thoracic spine reveals - no tenderness over thoracic vertebrae, no pain,  normal sensation and normal thoracic spine movements. Lumbosacral Spine - Examination of the lumbosacral spine reveals - no known fractures or deformities. Inspection and Palpation - Tenderness - mild. Assessment of pain reveals the following findings - The pain is characterized as - mild. Location - pain refers to lower back bilaterally. ROJM - Trunk Extension - 15 degrees. Lumbar Spine  Flexion - 35 . Lumbosacral Spine - Functional Testing - Babinski Test negative, Prone Knee Bending Test negative, Slump Test negative, Straight Leg Raising Test negative. Scoliosis noted.       Dr. Vernell Leep was available for immediate consult during this encounter.  An electronic signature was used to authenticate this note.  -- Lewayne Bunting, PA

## 2022-12-14 ENCOUNTER — Encounter

## 2022-12-14 NOTE — Progress Notes (Signed)
[id number]

## 2022-12-20 ENCOUNTER — Encounter: Payer: Medicare Other | Admitting: Dermatology

## 2023-01-30 ENCOUNTER — Inpatient Hospital Stay: Admit: 2023-01-30 | Payer: MEDICARE | Primary: Family

## 2023-01-30 DIAGNOSIS — Z1231 Encounter for screening mammogram for malignant neoplasm of breast: Secondary | ICD-10-CM

## 2023-05-20 ENCOUNTER — Encounter

## 2023-10-16 ENCOUNTER — Inpatient Hospital Stay
Admit: 2023-10-16 | Payer: MEDICARE | Attending: Student in an Organized Health Care Education/Training Program | Primary: Family

## 2023-10-16 DIAGNOSIS — M810 Age-related osteoporosis without current pathological fracture: Secondary | ICD-10-CM

## 2023-10-17 ENCOUNTER — Encounter

## 2023-11-09 ENCOUNTER — Encounter: Payer: MEDICARE | Attending: Physician Assistant | Primary: Family

## 2023-11-14 ENCOUNTER — Encounter: Admit: 2023-11-14 | Payer: MEDICARE | Admitting: Physician Assistant | Primary: Family

## 2023-11-14 ENCOUNTER — Ambulatory Visit: Admit: 2023-11-14 | Payer: MEDICARE | Primary: Family

## 2023-11-14 VITALS — Ht 66.0 in | Wt 154.0 lb

## 2023-11-14 DIAGNOSIS — M419 Scoliosis, unspecified: Secondary | ICD-10-CM

## 2023-11-14 NOTE — Progress Notes (Signed)
 1. Have you been to the ER, urgent care clinic since your last visit?  Hospitalized since your last visit? No    2. Have you seen or consulted any other health care providers outside of the [facility] since your last visit?  Include any pap

## 2023-11-16 NOTE — Progress Notes (Signed)
Natalie Sandoval (DOB: 08-Oct-1951) is a 72 y.o. female patient here for evaluation of the following chief complaint(s):  Follow-up (Scoliosis, recent bone density done)         ASSESSMENT/PLAN:  Below is the assessment and plan developed based on review of pertinent history, physical exam, labs, studies, and medications.    1. Kyphoscoliosis and scoliosis  -     XR SCOLIOSIS SURVEY STANDARD; Future  -     Amb External Referral To Physical Therapy      The patient's radiologic findings have been reviewed with her in detail today.  Her scoliosis appears stable at this point, and she will continue with home exercises as well as chiropractic care.  She will return for follow-up visit in 1 year to recheck her curvature, or sooner with any other changes in posture or discomfort.  She is also began with an order for outpatient physical therapy which she may start for her neck.  She will return for follow-up visit separately for her neck if her symptoms fail to improve with PT. she may use over-the-counter medications as needed.    Return in about 1 year (around 11/13/2024) for With PA.       SUBJECTIVE/OBJECTIVE:  Natalie Sandoval (DOB: 1951/09/08) is a 72 y.o. female who presents today for the following:  Chief Complaint   Patient presents with    Follow-up     Scoliosis, recent bone density done        HPI   Ms. Sessums returns today for a follow-up for scoliosis.  She states that she feels that she is doing quite well and her back feels stable.  She does have intermittent expected low back discomfort.  She continues faithfully with home exercises and continues with chiropractor twice per month.  No changes in posture.  Of note, she does mention having had a longstanding history of chronic neck pain without any significant arm symptoms.  Her history for her lumbar spine is noted below.      She states that she was diagnosed with scoliosis over 30 years ago.  She states that she has remained stable over the years, and has had  routine monitoring of her curvature in [facility].  She is just moved to the area and wishes to establish.  She brings her most recent x-rays with her today.  She denies any recent changes in posture.  She reports mild constant but stable low back discomfort without any radicular pain.  No numbness, tingling, or weakness in the legs.  No bowel or bladder changes.  She has established with [facility] here since her move, and has been working diligently with PT.  She feels that overall her posture has improved.  She has had a history of prior radiofrequency ablation in 2018, but has not needed this recently.  She uses Tylenol or ibuprofen occasionally for pain relief.  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  She also has a family history of scoliosis in both her mom, brother, and other younger family members.     IMAGING:  Xray Result (most recent):  XR SCOLIOSIS SURVEY STANDARD 11/14/2023    Narrative  Scoliosis films today reveal a stable thoracolumbar curvature with dextroconvex thoracic curvature and levoconvex lumbar curvature.  No change in comparison to films from 2023.       MRI Result (most recent):       Allergies   Allergen Reactions  Penicillins     Seasonal        Current Outpatient Medications   Medication Sig Dispense Refill    amLODIPine (NORVASC) 2.5 MG tablet Take 1 tablet by mouth daily      CALCIUM PO Take 800 mg by mouth daily      clobetasol (TEMOVATE) 0.05 % ointment Apply to AAs vaginal area starting BID until symptoms improve. Then use 2-3x/wk. Avoid face.      cycloSPORINE (RESTASIS) 0.05 % ophthalmic emulsion Apply 1 drop to eye 2 times daily      estradiol (ESTRACE) 0.1 MG/GM vaginal cream Use intravaginally 1-3 times per week.      fexofenadine (ALLEGRA) 180 MG tablet Take 1 tablet by mouth daily      fluticasone (FLONASE) 50 MCG/ACT nasal spray 2 sprays by Nasal route daily      levothyroxine  (SYNTHROID) 50 MCG tablet Take 1 tablet by mouth every morning      rosuvastatin (CRESTOR) 5 MG tablet Take 1 tablet by mouth nightly      Omega-3 Fatty Acids (FISH OIL) 1000 MG capsule Take 1 capsule by mouth       No current facility-administered medications for this visit.        No past medical history on file.     Past Surgical History:   Procedure Laterality Date    BREAST BIOPSY Right     b9       Family History   Problem Relation Age of Onset    Breast Cancer Maternal Grandmother         Social History     Tobacco Use    Smoking status: Never    Smokeless tobacco: Never   Substance Use Topics    Alcohol use: Not on file        Review of Systems   Constitutional: Negative.    HENT: Negative.     Eyes: Negative.    Respiratory: Negative.     Cardiovascular: Negative.    Gastrointestinal: Negative.    Endocrine: Negative.    Genitourinary: Negative.    Musculoskeletal:  Positive for back pain and neck pain.   Skin: Negative.    Allergic/Immunologic: Negative.    Hematological: Negative.    Psychiatric/Behavioral: Negative.          Failed to redirect to the Timeline version of the REVFS SmartLink.            No data to display                     No data to display                  Vitals:  Ht 1.676 m (5\' 6" )   Wt 69.9 kg (154 lb)   BMI 24.86 kg/m    Body mass index is 24.86 kg/m.    Physical Exam    Neurologic  Sensory  Light Touch - Intact - Globally.  Overall Assessment of Muscle Strength and Tone reveals  Lower Extremities - Right Iliopsoas - 5/5. Left Iliopsoas - 5/5. Right Tibialis Anterior - 5/5. Left Tibialis Anterior - 5/5. Right Gastroc-Soleus - 5/5. Left Gastroc-Soleus - 5/5. Right EHL - 5/5. Left EHL - 5/5.  General Assessment of Reflexes  Right Ankle - Clonus is not present. Left Ankle - Clonus is not present.  Reflexes (Dermatomes)  2/2 Normal - Left Achilles (L5-S2), Left Knee (L2-4), Right Achilles (L5-S2) and Right Knee (L2-4).  Musculoskeletal  Global Assessment  Examination of related  systems reveals - well-developed, well-nourished, in no acute distress, alert and oriented x 3. Gait and Station - normal gait and station and normal posture. Right Lower Extremity - normal strength and tone, normal range of motion without pain and no instability, subluxation or laxity. Left Lower Extremity - normal strength and tone, normal range of motion without pain and no instability, subluxation or laxity.  Spine/Ribs/Pelvis  Cervical Spine - Examination of the cervical spine reveals - no tenderness to palpation, no pain, no swelling, edema or erythema, normal cervical spine movements and normal sensation. Thoracic (Dorsal) Spine - Examination of the thoracic spine reveals - no tenderness over thoracic vertebrae, no pain, normal sensation and normal thoracic spine movements. Lumbosacral Spine - Examination of the lumbosacral spine reveals - no known fractures or deformities. Inspection and Palpation - Tenderness - moderate. Assessment of pain reveals the following findings - The pain is characterized as - moderate. Location - pain refers to lower back bilaterally. ROJM - Trunk Extension - 15 degrees. Lumbar Spine Flexion - 35 . Lumbosacral Spine - Functional Testing - Babinski Test negative, Prone Knee Bending Test negative, Slump Test negative, Straight Leg Raising Test negative.    Mild scoliosis noted.       Dr. Raylene Miyamoto was available for immediate consult during this encounter.  An electronic signature was used to authenticate this note.  -- Francesco Sor, PA

## 2023-12-22 ENCOUNTER — Encounter

## 2024-02-01 ENCOUNTER — Inpatient Hospital Stay: Admit: 2024-02-01 | Payer: MEDICARE | Primary: Family

## 2024-02-01 VITALS — Ht 66.0 in | Wt 154.0 lb

## 2024-02-01 DIAGNOSIS — Z1231 Encounter for screening mammogram for malignant neoplasm of breast: Secondary | ICD-10-CM

## 2024-11-14 ENCOUNTER — Ambulatory Visit: Admit: 2024-11-14 | Payer: MEDICARE | Primary: Family

## 2024-11-14 ENCOUNTER — Ambulatory Visit: Admit: 2024-11-14 | Discharge: 2024-11-14 | Payer: MEDICARE | Attending: Physician Assistant | Primary: Family

## 2024-11-14 VITALS — Ht 66.0 in | Wt 154.0 lb

## 2024-11-14 DIAGNOSIS — M419 Scoliosis, unspecified: Principal | ICD-10-CM

## 2024-11-14 NOTE — Progress Notes (Signed)
 "Natalie Sandoval (DOB: 01/17/1951) is a 73 y.o. female patient here for evaluation of the following chief complaint(s):  Follow-up (Patient is here for a 1 year follow-up to check the curvature of her scoliosis.)         ASSESSMENT/PLAN:  Below is the assessment and plan developed based on review of pertinent history, physical exam, labs, studies, and medications.    1. Kyphoscoliosis and scoliosis  -     XR SCOLIOSIS SURVEY STANDARD; Future  -     Amb External Referral To Physical Therapy  2. Chronic left-sided low back pain without sciatica  -     Amb External Referral To Physical Therapy  3. Chronic neck pain  -     Amb External Referral To Physical Therapy  4. Cervical radiculitis  -     Amb External Referral To Physical Therapy      The patient's radiologic findings have been reviewed with her in detail today.  Her scoliosis curvature remains stable, and her low back pain is stable in nature as well.  She is having increased neck pain that has been chronic in nature with more recent onset of 6 months of bilateral upper extremity radiculopathy.  She has had a recent cardiac surgery and is currently under restrictions for this with some mild left shoulder weakness which is likely postsurgical in nature.  We have discussed treatment options, and after she receives official clearance from her cardiothoracic surgeon, I recommended that she begin with outpatient physical therapy for both her neck and her back.  She will return for follow-up visit after completion of PT, whereupon we may consider a an updated MRI of the cervical spine if her symptoms fail to improve.  She may continue with over-the-counter medications as needed.      Return for PT Follow Up, With PA.       SUBJECTIVE/OBJECTIVE:  Natalie Sandoval (DOB: Nov 17, 1951) is a 73 y.o. female who presents today for the following:  Chief Complaint   Patient presents with    Follow-up     Patient is here for a 1 year follow-up to check the curvature of her scoliosis.         HPI   Natalie Sandoval returns today for an annual scoliosis follow-up.  With regards to her back symptoms, she remains relatively stable, and continues with diligent and daily home exercises.  She does note that since her last office visit, she has had a recent left pericardial surgery to relieve a significant pericardial effusion.  This surgery occurred approximately 3 weeks ago, and she is continuing to recover and has ongoing restrictions at this point.  She notes soreness in her thoracic area, which she expects postoperatively, and is having some difficulty with range of motion in her left shoulder postoperatively as well.  She denies any leg pain, numbness, or tingling in her legs.  No weakness in the legs.  No bowel or bladder changes.  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  She reports a 6+ month history of bilateral biceps aching to the elbow with new numbness and tingling in the 1st and 2nd fingers bilaterally.  No difficulty with balance or falls, but has had some intermittent difficulty dropping objects.  She has not had any recent physical therapy for her neck.    IMAGING:  Xray Result (most recent):  XR SCOLIOSIS SURVEY STANDARD 11/14/2024    Narrative  Scoliosis  films today reveal a stable appearing levoconvex lumbar curvature with compensatory dextroconvex thoracic curvature.  Multilevel degenerative changes present in the lumbar spine.  No change in curvature in comparison to films from 2024.       MRI Result (most recent):  MRI CERVICAL SPINE WO CONTRAST 05/16/2020    Narrative  CLINICAL DATA:  Right hand weakness.  Neck pain.    EXAM:  MRI CERVICAL SPINE WITHOUT CONTRAST    TECHNIQUE:  Multiplanar, multisequence MR imaging of the cervical spine was  performed. No intravenous contrast was administered.    COMPARISON:  07/03/2016    FINDINGS:  Alignment: Physiologic.    Vertebrae: No fracture, evidence of discitis,  or bone lesion.    Cord: Normal signal and morphology.    Posterior Fossa, vertebral arteries, paraspinal tissues: Negative.    Disc levels:    C1-2: Unremarkable.  C2-3: Normal disc space and facet joints. There is no spinal canal  stenosis. No neural foraminal stenosis.    C3-4: Normal disc space and facet joints. There is no spinal canal  stenosis. No neural foraminal stenosis.    C4-5: Minimal disc bulge. There is no spinal canal stenosis. No  neural foraminal stenosis.    C5-6: Uncovertebral hypertrophy with small right foraminal disc  protrusion, unchanged. There is no spinal canal stenosis. Unchanged  mild bilateral neural foraminal stenosis.    C6-7: Normal disc space and facet joints. There is no spinal canal  stenosis. No neural foraminal stenosis.    C7-T1: Normal disc space and facet joints. There is no spinal canal  stenosis. No neural foraminal stenosis.    IMPRESSION:  1. Unchanged C5-6 mild bilateral neural foraminal stenosis.  2. No spinal canal stenosis.      Electronically Signed  By: Franky Stanford M.D.  On: 05/17/2020 01:24       Allergies   Allergen Reactions    Environmental/Seasonal     Penicillins        Current Outpatient Medications   Medication Sig Dispense Refill    amLODIPine (NORVASC) 2.5 MG tablet Take 1 tablet by mouth daily      CALCIUM PO Take 800 mg by mouth daily      clobetasol (TEMOVATE) 0.05 % ointment Apply to AAs vaginal area starting BID until symptoms improve. Then use 2-3x/wk. Avoid face.      cycloSPORINE (RESTASIS) 0.05 % ophthalmic emulsion Apply 1 drop to eye 2 times daily      estradiol (ESTRACE) 0.1 MG/GM vaginal cream Use intravaginally 1-3 times per week.      fexofenadine (ALLEGRA) 180 MG tablet Take 1 tablet by mouth daily      fluticasone (FLONASE) 50 MCG/ACT nasal spray 2 sprays by Nasal route daily      levothyroxine (SYNTHROID) 50 MCG tablet Take 1 tablet by mouth every morning      rosuvastatin (CRESTOR) 5 MG tablet Take 1 tablet by mouth nightly       Omega-3 Fatty Acids (FISH OIL) 1000 MG capsule Take 1 capsule by mouth       No current facility-administered medications for this visit.        Past Medical History:   Diagnosis Date    Breast cyst 1999    Diffuse cystic mastopathy 1990    Hypercholesteremia 2019    Hypertension 2019    Osteoarthritis 1990    Osteoporosis 2020    Thyroid disease 2017        Past Surgical History:  Procedure Laterality Date    BREAST BIOPSY Right     b9    KNEE SURGERY  makoplasty rt knee in Dec 2013    PERICARDIAL WINDOW  10/21/2024    Performed at [facility]       Family History   Problem Relation Age of Onset    Breast Cancer Maternal Grandmother     Breast Cancer Mother     Scoliosis Mother     Right Breast Cancer Mother         Social History     Tobacco Use    Smoking status: Never    Smokeless tobacco: Never   Substance Use Topics    Alcohol use: Never        Review of Systems   Constitutional: Negative.    HENT: Negative.     Eyes: Negative.    Respiratory: Negative.     Cardiovascular: Negative.    Gastrointestinal: Negative.    Endocrine: Negative.    Genitourinary: Negative.    Musculoskeletal:  Positive for back pain, myalgias and neck pain.   Skin: Negative.    Allergic/Immunologic: Negative.    Neurological:  Positive for numbness.   Hematological: Negative.    Psychiatric/Behavioral: Negative.          Failed to redirect to the Timeline version of the REVFS SmartLink.            No data to display                    11/14/2024   Oswestry Disability Index   Pain Intensity 1   Personal Care (Washing,Dressing, etc.) 0   Lifting 3   Walking 1   Sitting 0   Standing 2   Sleeping 1   Social Life  0   Traveling  0   [place]estry Disability Total Scores 8   Numbers of Completion 9   [place]estry Disability Scores % 17.78         Vitals:  Ht 1.676 m (5' 6)   Wt 69.9 kg (154 lb)   BMI 24.86 kg/m    Body mass index is 24.86 kg/m.    Physical Exam    Neurologic  Sensory  Light Touch - Intact - Globally.  Overall Assessment  of Muscle Strength and Tone reveals  Upper Extremities - Right Deltoid - 5/5. Left Deltoid - 4+/5. Right Bicep - 5/5. Left Bicep - 5/5. Right Tricep - 5/5. Left Tricep - 5/5. Right Wrist Extensors - 5/5. Left Wrist Extensors - 5/5. Right Wrist Flexors - 5/5. Left Wrist Flexors - 5/5. Right Intrinsics - 5/5. Left Intrinsics - 5/5.  Lower Extremities - Right Iliopsoas - 5/5. Left Iliopsoas - 5/5. Right Tibialis Anterior - 5/5. Left Tibialis Anterior - 5/5. Right Gastroc-Soleus - 5/5. Left Gastroc-Soleus - 5/5. Right EHL - 5/5. Left EHL - 5/5.  General Assessment of Reflexes  Right Hand - Hoffman's sign is negative in the right hand. Left Hand - Hoffman's sign is negative in the left hand.  Right Ankle - Clonus is not present. Left Ankle - Clonus is not present.  Reflexes (Dermatomes)  2/2 Normal - Left Bicep (C5-6), Left Tricep (C7-8), Left Brachioradialis (C5-6), Right Bicep (C5-6), Right Tricep (C7-8) and Right Brachioradialis (C5-6).  Left Achilles (L5-S2), Left Knee (L2-4), Right Achilles (L5-S2) and Right Knee (L2-4).    Musculoskeletal  Global Assessment  Examination of related systems reveals - well-developed, well-nourished, in no acute distress, alert and oriented x  3 and normal coordination. Gait and Station - normal gait and station and normal posture.  Spine/Ribs/Pelvis  Cervical Spine - Evaluation of related systems reveals - no lymphadenopathy and neurovascularly intact bilaterally. Inspection and Palpation - Tenderness - moderate and localized.  Assessment of pain reveals the following findings: - Location - cervical area. ROJM - Flexion - 85 . Right Lateral Flexion - 35 . Left Lateral Flexion - 35 . Extension - 70 . Right Rotation - 80 . Left Rotation - 80 . Cervical Spine - Functional Testing - Foraminal Compression/Spurling's Test negative, Shoulder Depression Test negative, Upper Limb Tension Test negative.     Thoracic (Dorsal) Spine - Examination of the thoracic spine reveals - no tenderness  over thoracic vertebrae, no pain, normal sensation and normal thoracic spine movements.     Lumbosacral Spine - Examination of the lumbosacral spine reveals - no known fractures or deformities. Inspection and Palpation - Tenderness - moderate. Assessment of pain reveals the following findings - The pain is characterized as - moderate. Location - pain refers to lower back bilaterally. ROJM - Trunk Extension - 15 degrees. Lumbar Spine Flexion - 35 . Lumbosacral Spine - Functional Testing - Babinski Test negative, Prone Knee Bending Test negative, Slump Test negative, Straight Leg Raising Test negative.      Dr. Lindon was available for immediate consult during this encounter.  An electronic signature was used to authenticate this note.  -- Isaiah Bal, PA   "

## 2024-11-14 NOTE — Progress Notes (Signed)
"  1. Have you been to the ER, urgent care clinic since your last visit?  Hospitalized since your last visit?Yes patient went to [facility] on 10/21/24 and stayed for 5 days for a pericardial window to drain fluid from her pericardial sac    2. Have you seen or consulted any other health care providers outside of the [facility] since your last visit?  Include any pap smears or colon screening. Yes patient sees an endocrinologist for osteopenia and hypothyroidism at [facility], she also sees a cardiologist is with [facility]      Chief Complaint   Patient presents with    Follow-up     Patient is here for a 1 year follow-up to check the curvature of her scoliosis.       "
